# Patient Record
Sex: Female | Born: 1935 | Race: Black or African American | Hispanic: No | Marital: Single | Smoking: Never smoker
Health system: Southern US, Community
[De-identification: ages and names within clinical notes are randomized; demographics above are authoritative.]

## PROBLEM LIST (undated history)

## (undated) DIAGNOSIS — E559 Vitamin D deficiency, unspecified: Secondary | ICD-10-CM

## (undated) DIAGNOSIS — E785 Hyperlipidemia, unspecified: Secondary | ICD-10-CM

## (undated) DIAGNOSIS — M179 Osteoarthritis of knee, unspecified: Secondary | ICD-10-CM

## (undated) DIAGNOSIS — D631 Anemia in chronic kidney disease: Secondary | ICD-10-CM

## (undated) DIAGNOSIS — E213 Hyperparathyroidism, unspecified: Secondary | ICD-10-CM

## (undated) DIAGNOSIS — M48061 Spinal stenosis, lumbar region without neurogenic claudication: Secondary | ICD-10-CM

## (undated) DIAGNOSIS — I10 Essential (primary) hypertension: Secondary | ICD-10-CM

## (undated) DIAGNOSIS — N183 Chronic kidney disease, stage 3 (moderate): Secondary | ICD-10-CM

## (undated) DIAGNOSIS — E164 Increased secretion of gastrin: Secondary | ICD-10-CM

## (undated) DIAGNOSIS — I779 Disorder of arteries and arterioles, unspecified: Secondary | ICD-10-CM

## (undated) DIAGNOSIS — I739 Peripheral vascular disease, unspecified: Secondary | ICD-10-CM

## (undated) DIAGNOSIS — J45909 Unspecified asthma, uncomplicated: Secondary | ICD-10-CM

## (undated) DIAGNOSIS — C259 Malignant neoplasm of pancreas, unspecified: Secondary | ICD-10-CM

## (undated) DIAGNOSIS — K219 Gastro-esophageal reflux disease without esophagitis: Secondary | ICD-10-CM

## (undated) DIAGNOSIS — M1710 Unilateral primary osteoarthritis, unspecified knee: Secondary | ICD-10-CM

## (undated) DIAGNOSIS — K861 Other chronic pancreatitis: Secondary | ICD-10-CM

## (undated) DIAGNOSIS — N6091 Unspecified benign mammary dysplasia of right breast: Secondary | ICD-10-CM

## (undated) DIAGNOSIS — E119 Type 2 diabetes mellitus without complications: Secondary | ICD-10-CM

## (undated) HISTORY — DX: Gastro-esophageal reflux disease without esophagitis: K21.9

## (undated) HISTORY — PX: OVARIAN CYST REMOVAL: SHX89

## (undated) HISTORY — PX: WHIPPLE PROCEDURE: SHX2667

## (undated) HISTORY — DX: Chronic kidney disease, stage 3 (moderate): N18.3

## (undated) HISTORY — DX: Hyperlipidemia, unspecified: E78.5

## (undated) HISTORY — DX: Peripheral vascular disease, unspecified: I73.9

## (undated) HISTORY — DX: Disorder of arteries and arterioles, unspecified: I77.9

## (undated) HISTORY — DX: Increased secretion of gastrin: E16.4

## (undated) HISTORY — DX: Unilateral primary osteoarthritis, unspecified knee: M17.10

## (undated) HISTORY — DX: Osteoarthritis of knee, unspecified: M17.9

## (undated) HISTORY — PX: CATARACT EXTRACTION, BILATERAL: SHX1313

## (undated) HISTORY — DX: Essential (primary) hypertension: I10

## (undated) HISTORY — DX: Anemia in chronic kidney disease: D63.1

## (undated) HISTORY — DX: Vitamin D deficiency, unspecified: E55.9

## (undated) HISTORY — DX: Malignant neoplasm of pancreas, unspecified: C25.9

## (undated) HISTORY — PX: PARATHYROIDECTOMY: SHX19

## (undated) HISTORY — DX: Hyperparathyroidism, unspecified: E21.3

## (undated) HISTORY — DX: Other chronic pancreatitis: K86.1

## (undated) HISTORY — PX: TONSILLECTOMY: SUR1361

## (undated) HISTORY — DX: Unspecified asthma, uncomplicated: J45.909

## (undated) HISTORY — DX: Type 2 diabetes mellitus without complications: E11.9

---

## 2001-01-17 ENCOUNTER — Encounter: Payer: Self-pay | Admitting: Family Medicine

## 2001-01-17 ENCOUNTER — Ambulatory Visit (HOSPITAL_COMMUNITY): Admission: RE | Admit: 2001-01-17 | Discharge: 2001-01-17 | Payer: Self-pay | Admitting: Unknown Physician Specialty

## 2001-06-26 ENCOUNTER — Ambulatory Visit (HOSPITAL_COMMUNITY): Admission: RE | Admit: 2001-06-26 | Discharge: 2001-06-26 | Payer: Self-pay | Admitting: Unknown Physician Specialty

## 2001-06-26 ENCOUNTER — Encounter: Payer: Self-pay | Admitting: Occupational Therapy

## 2001-06-27 ENCOUNTER — Other Ambulatory Visit: Admission: RE | Admit: 2001-06-27 | Discharge: 2001-06-27 | Payer: Self-pay | Admitting: Specialist

## 2001-08-03 ENCOUNTER — Encounter: Payer: Self-pay | Admitting: Occupational Therapy

## 2001-08-03 ENCOUNTER — Ambulatory Visit (HOSPITAL_COMMUNITY): Admission: RE | Admit: 2001-08-03 | Discharge: 2001-08-03 | Payer: Self-pay | Admitting: Occupational Therapy

## 2002-08-20 ENCOUNTER — Encounter: Payer: Self-pay | Admitting: Occupational Therapy

## 2002-08-20 ENCOUNTER — Ambulatory Visit (HOSPITAL_COMMUNITY): Admission: RE | Admit: 2002-08-20 | Discharge: 2002-08-20 | Payer: Self-pay | Admitting: Occupational Therapy

## 2002-12-10 ENCOUNTER — Ambulatory Visit (HOSPITAL_COMMUNITY): Admission: RE | Admit: 2002-12-10 | Discharge: 2002-12-10 | Payer: Self-pay | Admitting: Occupational Therapy

## 2002-12-10 ENCOUNTER — Encounter: Payer: Self-pay | Admitting: Occupational Therapy

## 2003-08-28 ENCOUNTER — Ambulatory Visit (HOSPITAL_COMMUNITY): Admission: RE | Admit: 2003-08-28 | Discharge: 2003-08-28 | Payer: Self-pay | Admitting: Occupational Therapy

## 2004-09-01 ENCOUNTER — Ambulatory Visit (HOSPITAL_COMMUNITY): Admission: RE | Admit: 2004-09-01 | Discharge: 2004-09-01 | Payer: Self-pay | Admitting: Occupational Therapy

## 2005-05-22 ENCOUNTER — Emergency Department (HOSPITAL_COMMUNITY): Admission: EM | Admit: 2005-05-22 | Discharge: 2005-05-22 | Payer: Self-pay | Admitting: Emergency Medicine

## 2005-09-03 ENCOUNTER — Ambulatory Visit (HOSPITAL_COMMUNITY): Admission: RE | Admit: 2005-09-03 | Discharge: 2005-09-03 | Payer: Self-pay | Admitting: Occupational Therapy

## 2006-02-01 ENCOUNTER — Ambulatory Visit (HOSPITAL_COMMUNITY): Admission: RE | Admit: 2006-02-01 | Discharge: 2006-02-01 | Payer: Self-pay | Admitting: Urology

## 2006-07-26 ENCOUNTER — Observation Stay (HOSPITAL_COMMUNITY): Admission: EM | Admit: 2006-07-26 | Discharge: 2006-07-27 | Payer: Self-pay | Admitting: Emergency Medicine

## 2006-08-12 ENCOUNTER — Ambulatory Visit (HOSPITAL_COMMUNITY): Admission: RE | Admit: 2006-08-12 | Discharge: 2006-08-12 | Payer: Self-pay | Admitting: Nurse Practitioner

## 2006-08-26 ENCOUNTER — Ambulatory Visit (HOSPITAL_COMMUNITY): Admission: RE | Admit: 2006-08-26 | Discharge: 2006-08-26 | Payer: Self-pay | Admitting: Nurse Practitioner

## 2006-09-22 ENCOUNTER — Ambulatory Visit (HOSPITAL_COMMUNITY): Admission: RE | Admit: 2006-09-22 | Discharge: 2006-09-22 | Payer: Self-pay | Admitting: Nurse Practitioner

## 2007-04-15 ENCOUNTER — Emergency Department (HOSPITAL_COMMUNITY): Admission: EM | Admit: 2007-04-15 | Discharge: 2007-04-15 | Payer: Self-pay | Admitting: *Deleted

## 2007-09-25 ENCOUNTER — Ambulatory Visit (HOSPITAL_COMMUNITY): Admission: RE | Admit: 2007-09-25 | Discharge: 2007-09-25 | Payer: Self-pay | Admitting: Nurse Practitioner

## 2007-10-12 HISTORY — PX: COLONOSCOPY: SHX174

## 2008-05-17 ENCOUNTER — Emergency Department (HOSPITAL_COMMUNITY): Admission: EM | Admit: 2008-05-17 | Discharge: 2008-05-17 | Payer: Self-pay | Admitting: Emergency Medicine

## 2008-06-04 ENCOUNTER — Ambulatory Visit (HOSPITAL_COMMUNITY): Admission: RE | Admit: 2008-06-04 | Discharge: 2008-06-04 | Payer: Self-pay | Admitting: Nurse Practitioner

## 2008-07-05 ENCOUNTER — Ambulatory Visit (HOSPITAL_COMMUNITY): Admission: RE | Admit: 2008-07-05 | Discharge: 2008-07-05 | Payer: Self-pay | Admitting: Nurse Practitioner

## 2008-10-01 ENCOUNTER — Ambulatory Visit (HOSPITAL_COMMUNITY): Admission: RE | Admit: 2008-10-01 | Discharge: 2008-10-01 | Payer: Self-pay | Admitting: Nurse Practitioner

## 2008-10-03 ENCOUNTER — Ambulatory Visit (HOSPITAL_COMMUNITY): Admission: RE | Admit: 2008-10-03 | Discharge: 2008-10-03 | Payer: Self-pay | Admitting: Nurse Practitioner

## 2008-12-14 ENCOUNTER — Emergency Department (HOSPITAL_COMMUNITY): Admission: EM | Admit: 2008-12-14 | Discharge: 2008-12-14 | Payer: Self-pay | Admitting: Emergency Medicine

## 2009-10-14 ENCOUNTER — Ambulatory Visit (HOSPITAL_COMMUNITY): Admission: RE | Admit: 2009-10-14 | Discharge: 2009-10-14 | Payer: Self-pay | Admitting: Specialist

## 2009-11-03 ENCOUNTER — Emergency Department (HOSPITAL_COMMUNITY): Admission: EM | Admit: 2009-11-03 | Discharge: 2009-11-03 | Payer: Self-pay | Admitting: Emergency Medicine

## 2010-04-07 ENCOUNTER — Ambulatory Visit (HOSPITAL_COMMUNITY): Admission: RE | Admit: 2010-04-07 | Discharge: 2010-04-07 | Payer: Self-pay | Admitting: Nurse Practitioner

## 2010-09-11 ENCOUNTER — Ambulatory Visit (HOSPITAL_COMMUNITY)
Admission: RE | Admit: 2010-09-11 | Discharge: 2010-09-11 | Payer: Self-pay | Source: Home / Self Care | Admitting: Occupational Therapy

## 2010-11-01 ENCOUNTER — Encounter: Payer: Self-pay | Admitting: Urology

## 2010-11-05 ENCOUNTER — Ambulatory Visit (HOSPITAL_COMMUNITY)
Admission: RE | Admit: 2010-11-05 | Discharge: 2010-11-05 | Payer: Self-pay | Source: Home / Self Care | Attending: Nurse Practitioner | Admitting: Nurse Practitioner

## 2011-01-08 ENCOUNTER — Other Ambulatory Visit: Payer: Self-pay | Admitting: Obstetrics & Gynecology

## 2011-01-08 ENCOUNTER — Other Ambulatory Visit (HOSPITAL_COMMUNITY)
Admission: RE | Admit: 2011-01-08 | Discharge: 2011-01-08 | Disposition: A | Discharge status: Home / Self Care | Payer: Medicare Other | Source: Ambulatory Visit | Attending: Obstetrics and Gynecology | Admitting: Obstetrics and Gynecology

## 2011-01-08 DIAGNOSIS — Z124 Encounter for screening for malignant neoplasm of cervix: Secondary | ICD-10-CM | POA: Insufficient documentation

## 2011-02-22 ENCOUNTER — Ambulatory Visit: Payer: Self-pay | Admitting: Orthopedic Surgery

## 2011-02-26 NOTE — Procedures (Signed)
   NAME:  Veronica Stevenson, Veronica Stevenson                         ACCOUNT NO.:  [id number]   MEDICAL RECORD NO.:  [id number]                   PATIENT TYPE:  OUT   LOCATION:  RAD                                  FACILITY:  [facility]   PHYSICIAN:  Vida Roller, M.D.                DATE OF BIRTH:  Apr 10, 1933   DATE OF PROCEDURE:  12/10/2002  DATE OF DISCHARGE:                                  ECHOCARDIOGRAM   REFERRING PHYSICIAN:  Dr. Randell Patient   PROCEDURE:  Echocardiogram.  Tape #[id number], tape count 1498 to 1877.   REASON FOR CONSULTATION:  Syncope.  Quality of the echocardiogram is  adequate.   M-MODE MEASUREMENTS:  1. The aorta is 29 mm.  2. The left atrium is 33 mm.  3. The septum is 14 mm, which is enlarged.  4. The posterior wall is 10 mm.  5. The left ventricular diastolic dimension is 35 mm.  6. The left ventricular systolic dimension is 25 mm.   2-D AND DOPPLER IMAGING:  1. The left ventricle is normal size with mild concentric left ventricular     hypertrophy.  There is evidence of inferior wall and inferoposterior     hypokinesis with a mild thinning of the posterior wall of the left     ventricle.  The diastolic function is mildly impaired but not fully     assessed.  2. The right ventricle is normal size with normal systolic function.  3. Both atria are normal size.  The subcostal views are inadequate to assess     for atrioseptal defect.  4. The aortic valve is trileaflet/tricommissural with mild sclerosis.  No     stenosis or regurgitation is seen.  5. The mitral valve is morphologically unremarkable with no stenosis or     regurgitation.  6. The tricuspid valve is morphologically unremarkable with mild tricuspid     regurgitation.  No stenosis is seen.  7. The pulmonic valve was not well seen.  8. The pericardial structures were not well seen.  9. The ascending aorta was not well seen.                                               Vida Roller, M.D.    JH/MEDQ  D:   12/10/2002  T:  12/10/2002  Job:  [id number]

## 2011-02-26 NOTE — H&P (Signed)
Veronica Stevenson, Veronica Stevenson               ACCOUNT NO.:  [id number]   MEDICAL RECORD NO.:  [id number]          PATIENT TYPE:  OBV   LOCATION:  A218                          FACILITY:  [facility]   PHYSICIAN:  Osvaldo Shipper, MD     DATE OF BIRTH:  1933-06-18   DATE OF ADMISSION:  07/26/2006  DATE OF DISCHARGE:  LH                                HISTORY & PHYSICAL   PRIMARY CARE PHYSICIAN:  Dr. Ninfa Linden  at [place].   CARDIOLOGIST:  Dr. Jerrol Banana?  at [place], [place].   ADMITTING DIAGNOSES:  1. Chest pain, rule out acute coronary syndrome, rule out pulmonary      embolism.  2. History of hypertension.  3. History of dyslipidemia.  4. History of mild nonobstructive coronary artery disease.  5. History of asthma.   CHIEF COMPLAINT:  Chest pain since this morning.   HISTORY OF PRESENT ILLNESS:  The patient is a 77 year old, African-American  female who has history of hypertension and dyslipidemia who woke up this  morning and ate breakfast about 745.  At about 8:30, she experienced  retrosternal chest pressure that she also describes sometimes as a sharp  pain.  She was at rest when the pain began.  It was 10/10 in intensity.  She  walked in the hallway of her home which seemed to relieve the pain.  It  became a 4-6 out of 10.  She then subsequently took 3 Rolaids which the pain  went away.  Subsequently, she went to her car and started driving to [employer], halfway in between near [place] she had a  recurrence of her  sharp pain 10/10 in intensity, and she came into the ED.  By the time the  patient was given any medications in the ER, the patient's pain had gone  away.  Currently, she is completely pain free.  She did not have any  shortness of breath, palpitation, nausea, vomiting, cough, fever, or chills  with this episode.  No history of similar chest pain in the past.   The patient reports a stress test one year ago which was done by Dr. Jerrol Banana?  at [facility] and which  apparently was abnormal which prompted a cardiac  catheterization also done 1 year ago which apparently showed 20% blockages.  The patient is quite emphatic that she did not have any kind of intervention  done during that procedure.  The patient also reports a carotid Doppler  study done just a few days ago at [facility] which apparently did not show any  significant disease.   MEDICATIONS AT HOME:  1. Toprol XL 25 mg daily.  2. Enteric-coated aspirin 81 mg daily.  3. Zocor 10 mg daily.  4. Azmacort p.r.n.   ALLERGIES:  PENICILLIN causes syncope.  CODEINE causes a rash.   PAST MEDICAL HISTORY:  1. Hypertension.  2. Asthma versus bronchitis.  3. Dyslipidemia.  4. No surgeries in the past.   FAMILY HISTORY:  Her mother had CVA and MI at the age of 49.  Father had  stroke.   SOCIAL HISTORY:  L[place] in  [place].  Works.  She is currently retired.  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  No  smoking use.  No alcohol use.  No illicit drug use.  She is independent with  her ADLs.   REVIEW OF SYSTEMS:  GENERAL:  Unremarkable.  CARDIOVASCULAR:  See HPI.  RESPIRATORY:  See HPI.  GI:  Unremarkable.  GU:  Unremarkable.  ENDOCRINE:  Unremarkable.  NEUROLOGICAL:  Unremarkable.   PHYSICAL EXAMINATION:  VITAL SIGNS:  The patient's temperature 97.3, heart  rate in the 70s, respiratory rate 16, blood pressure 156/90, saturations not  recorded on the floor.  GENERAL:  This is a well-developed, well-nourished individual in no apparent  distress.  HEENT:  There is no pallor.  No icterus.  Oral mucous membrane is moist.  No  oral lesions are noted.  NECK:  Soft.  No thyromegaly appreciated.  LUNGS:  Clear to auscultation bilaterally.  CARDIOVASCULAR:  S1, S2 is normal regular.  No murmurs appreciated.  No S3,  S4.  No JVD is heard.  No murmurs heard.  There is a left-sided carotid  bruit that is appreciated.  ABDOMEN:  Soft, nontender, and nondistended.  Bowel  sounds are present.  No  mass or organomegaly appreciated.  EXTREMITIES:  Without edema.  Peripheral pulses are palpable.  No calf  tenderness is present.  NEUROLOGICALLY:  The patient is alert and oriented x3.  No focal  neurological deficits appreciated.   LAB DATA:  CBC is unremarkable.  BMET revealed a potassium of 3.4.  LFTs are  not available.  Two sets of cardiac markers are negative.   Chest x-ray was unremarkable with no acute cardiopulmonary process.   EKG shows normal sinus rhythm with a normal axis.  Two sets of EKG available  about 4 hours apart.  The first set possibly shows mild ST depression in 4,  5, 6, but they are not consistent findings.  The second set of EKG shows no  ST depressions but again not too much of a difference between the 2 EKGs.   IMPRESSION:  This is a 77 year old, African-American female with history of  hypertension, asthma, and dyslipidemia who presents with chest pain.  The  pain is somewhat atypical for cardiac etiology.  This especially considering  recent unremarkable cardiac cath.  Hence, coronary artery disease is less  likely.  Other differentials include pulmonary embolism or acid reflux  disease.  The acid reflux disease is more likely diagnosed in this  individual.   PLAN:  Chest pain.  We will admit to telemetry and actually observe her in  the hospital.  We will get a D-dimer to screen for PE.  Do serial cardiac  enzymes and check a lipid profile in the morning.  Start her on PPIs.  If  enzymes are negative and if her D-dimer is unremarkable, we will let her go  home tomorrow and ask her to followup with her cardiologist.  If D-dimer is  abnormal, we will obviously obtain a CT scan of the chest.  The patient  reports recent carotid Doppler for her carotid bruit.  Hence, I will not  repeat this test at this time since she does not have any neurological deficits.  Echo may be considered on this individual if there is any reason  by  tomorrow morning to get one, but I do have a report of an echo from 2004  which reveals LVH with some hypokinesis in the inferior wall and  posterior  wall.  The systolic function was not commented upon.  There was some  diastolic dysfunction appreciated.   She is hypertensive.  We will restart her Toprol which she did not take  today.  If needed, further agents will be added.  Continue Zocor.  Continue  aspirin.   DVT/GI prophylaxis initiated.      Osvaldo Shipper, MD  Electronically Signed     GK/MEDQ  D:  07/26/2006  T:  07/26/2006  Job:  [id number]

## 2011-02-26 NOTE — Discharge Summary (Signed)
Veronica Stevenson, Veronica Stevenson               ACCOUNT NO.:  [id number]   MEDICAL RECORD NO.:  [id number]          PATIENT TYPE:  OBV   LOCATION:  A218                          FACILITY:  [facility]   PHYSICIAN:  Osvaldo Shipper, MD     DATE OF BIRTH:  Jul 23, 1933   DATE OF ADMISSION:  07/26/2006  DATE OF DISCHARGE:  10/17/2007LH                                 DISCHARGE SUMMARY   CARDIOLOGIST:  Dr. Graciela Husbands? at [place].   PRIMARY MEDICAL DOCTOR:  At [place].   DISCHARGE DIAGNOSES:  1. Chest pain possibly related to gastroesophageal reflux disease or      musculoskeletal.  2. Incidental finding of right lower lobe infiltrate.  3. History of nonobstructive coronary artery disease with catheterization      1 year ago.  4. Left carotid bruit with a recent carotid ultrasound, results unknown.  5. History of hypertension, better controlled.  6. History of dyslipidemia.   BRIEF HOSPITAL COURSE:  Briefly, this is a 77 year old African American  female who presented with retrosternal chest pain, whose onset was 1 day  prior to admission.  The patient's symptoms were atypical for coronary  artery disease.  She had negative EKG and negative cardiac markers; however,  she was admitted because she had history of hypertension and dyslipidemia.  The patient was observed in the hospital overnight.  Cardiac enzymes were  cycled, which were all negative.  Blood work today is unremarkable.  D-dimer  was mildly positive; hence, because of the sharp character of her pain, she  underwent a CT of her chest, which was negative for PE as well.  CT,  however, did show an incidental right lower lobe atelectasis versus  pneumonia.  The patient denied any symptoms of cough or fever.   Her blood pressure was also not optimally controlled, hence we increased her  Toprol to 50 mg daily.  Her blood pressure is better controlled this  morning.   She does have a left carotid bruit and I was told by the patient that she  did  have an ultrasound of her carotids just a week ago and she does not know  the results of this.  She said she will talk to Dr. Graciela Husbands?, her  cardiologist, for the results when she goes home.   This morning, the patient is feeling quite well.  She has not had any chest  pain since yesterday.  She does not have any neurological deficits.  No  other symptoms whatsoever.  She is keen on going home; considering this, we  will discharge her.   DISCHARGE MEDICATIONS:  1. Levaquin 750 mg p.o. daily for 5 days; one dose will be given in the      hospital.  2. Prilosec 20 mg daily.  3. Toprol-XL 50 mg daily.  4. Otherwise, she may resume her other outpatient medications as before.   DIET:  Heart-healthy diet.   PHYSICAL ACTIVITY:  No restrictions.   FOLLOWUP:  She has been asked to call her cardiologist as soon as possible  to schedule an appointment.   Please also review my H&P  for further details regarding this individual.   TOTAL TIME OF DISCHARGE:  Twenty minutes.   COMMENT:  Please note above is preliminary until signed.      Osvaldo Shipper, MD  Electronically Signed     GK/MEDQ  D:  07/27/2006  T:  07/27/2006  Job:  [id number]   cc:   [address], [address], [place], [place] [address] Autumn Patty M.D.   [place], [place] Jay Schlichter MD

## 2011-07-09 LAB — URINALYSIS, ROUTINE W REFLEX MICROSCOPIC
Glucose, UA: NEGATIVE
Specific Gravity, Urine: >1.03 — ABNORMAL HIGH
pH: 5.5

## 2011-07-09 LAB — URINE MICROSCOPIC-ADD ON

## 2011-11-12 ENCOUNTER — Other Ambulatory Visit (HOSPITAL_COMMUNITY): Payer: Self-pay | Admitting: Nurse Practitioner

## 2011-11-12 DIAGNOSIS — Z139 Encounter for screening, unspecified: Secondary | ICD-10-CM

## 2011-11-18 ENCOUNTER — Ambulatory Visit (HOSPITAL_COMMUNITY)
Admission: RE | Admit: 2011-11-18 | Discharge: 2011-11-18 | Disposition: A | Discharge status: Home / Self Care | Payer: Medicare Other | Source: Ambulatory Visit | Attending: Nurse Practitioner | Admitting: Nurse Practitioner

## 2011-11-18 DIAGNOSIS — Z1231 Encounter for screening mammogram for malignant neoplasm of breast: Secondary | ICD-10-CM | POA: Insufficient documentation

## 2011-11-18 DIAGNOSIS — Z139 Encounter for screening, unspecified: Secondary | ICD-10-CM

## 2012-03-26 ENCOUNTER — Encounter (HOSPITAL_COMMUNITY): Payer: Self-pay | Admitting: Emergency Medicine

## 2012-03-26 ENCOUNTER — Emergency Department (HOSPITAL_COMMUNITY): Payer: Medicare Other

## 2012-03-26 ENCOUNTER — Emergency Department (HOSPITAL_COMMUNITY)
Admission: EM | Admit: 2012-03-26 | Discharge: 2012-03-26 | Disposition: A | Discharge status: Home / Self Care | Payer: Medicare Other | Attending: Emergency Medicine | Admitting: Emergency Medicine

## 2012-03-26 DIAGNOSIS — R059 Cough, unspecified: Secondary | ICD-10-CM | POA: Insufficient documentation

## 2012-03-26 DIAGNOSIS — Z79899 Other long term (current) drug therapy: Secondary | ICD-10-CM | POA: Insufficient documentation

## 2012-03-26 DIAGNOSIS — I10 Essential (primary) hypertension: Secondary | ICD-10-CM | POA: Insufficient documentation

## 2012-03-26 DIAGNOSIS — R05 Cough: Secondary | ICD-10-CM

## 2012-03-26 DIAGNOSIS — E7800 Pure hypercholesterolemia, unspecified: Secondary | ICD-10-CM | POA: Insufficient documentation

## 2012-03-26 DIAGNOSIS — Z7982 Long term (current) use of aspirin: Secondary | ICD-10-CM | POA: Insufficient documentation

## 2012-03-26 HISTORY — DX: Essential (primary) hypertension: I10

## 2012-03-26 NOTE — ED Notes (Signed)
Pt states she does not want to wait any longer.  States since she doesn't have pneumonia she is going to go home, and take OTC cough medicine.  Explained to pt leaving AMA, and pt verbalized understanding of ramifications.  AMA form signed.

## 2012-03-26 NOTE — ED Notes (Signed)
Patient c/o cough x3 days. Per patient productive with white thin sputum. Patient states "I was treated 2 months ago for bronchitis by my doctor with antibiotics and I got better. So I don't know if this is another episode." Patient reports shortness of breath after coughing-noted with excertion.

## 2012-03-26 NOTE — ED Notes (Signed)
Pt reporting cough x3 nights.  Reports SOB "only when I cough a lot".  Reports thin white sputum.  Denies fever, nausea or vomiting.  No additional complaints.  No distress noted.

## 2012-03-26 NOTE — ED Provider Notes (Signed)
History   This chart was scribed for Veronica Stevenson B. Bernette Mayers, MD scribed by Magnus Sinning. The patient was seen in room APA10/APA10 seen at 17:30.   CSN: [id number]  Arrival date & time 03/26/12  1656   First MD Initiated Contact with Patient 03/26/12 1730      Chief Complaint  Patient presents with  . Cough    (Consider location/radiation/quality/duration/timing/severity/associated sxs/prior treatment) HPI Veronica Stevenson is a 78 y.o. female who presents to the Emergency Department complaining of constant moderate productive cough, onset 3 days with associated SOB,(worse at night), and sore throat. Has had some chest soreness. Pt states she has hx of bronchitis, which was  treated with abx 2 months ago. She says it was relieved at abx completion, but explains that it feels like it has returned. States she took robitussin with no relief. Denies fever, runny nose or hx of tobacco use. Past Medical History  Diagnosis Date  . Hypertension   . High cholesterol   . Bronchitis     Past Surgical History  Procedure Date  . Cardiac catheterization     History reviewed. No pertinent family history.  History  Substance Use Topics  . Smoking status: Never Smoker   . Smokeless tobacco: Never Used  . Alcohol Use: No   Review of Systems 10 Systems reviewed and are negative for acute change except as noted in the HPI. Allergies  Codeine and Penicillins  Home Medications   Current Outpatient Rx  Name Route Sig Dispense Refill  . ASPIRIN EC 81 MG PO TBEC Oral Take 81 mg by mouth every morning.    Marland Kitchen VITAMIN D 2000 UNITS PO CAPS Oral Take 1 capsule by mouth daily.    Marland Kitchen LOSARTAN POTASSIUM-HCTZ 50-12.5 MG PO TABS Oral Take 1 tablet by mouth every morning.    Marland Kitchen METOPROLOL SUCCINATE ER 25 MG PO TB24 Oral Take 25 mg by mouth every morning.    Marland Kitchen NAPROXEN SODIUM 220 MG PO CAPS Oral Take 220 mg by mouth as needed. For pain    . SIMVASTATIN 40 MG PO TABS Oral Take 40 mg by mouth every evening.        BP 141/66  Pulse 72  Temp 98.2 F (36.8 C) (Oral)  Resp 20  Ht 5\' 5"  (1.651 m)  Wt 144 lb (65.318 kg)  BMI 23.96 kg/m2  SpO2 98%  Physical Exam  Nursing note and vitals reviewed. Constitutional: She is oriented to person, place, and time. She appears well-developed and well-nourished.  HENT:  Head: Normocephalic and atraumatic.  Eyes: EOM are normal. Pupils are equal, round, and reactive to light.  Neck: Normal range of motion. Neck supple.       Throat nml.  Cardiovascular: Normal rate, normal heart sounds and intact distal pulses.   Pulmonary/Chest: Effort normal and breath sounds normal. She has no wheezes. She has no rales.       Lungs clear. Dry cough.  Abdominal: Bowel sounds are normal. She exhibits no distension. There is no tenderness.  Musculoskeletal: Normal range of motion. She exhibits no edema and no tenderness.  Neurological: She is alert and oriented to person, place, and time. She has normal strength. No cranial nerve deficit or sensory deficit.  Skin: Skin is warm and dry. No rash noted.  Psychiatric: She has a normal mood and affect.    ED Course  Procedures (including critical care time) DIAGNOSTIC STUDIES: Oxygen Saturation is 98% on room air, normal by my interpretation.  COORDINATION OF CARE:   Dg Chest 2 View  03/26/2012  *RADIOLOGY REPORT*  Clinical Data: Cough.  Bronchitis peri  CHEST - 2 VIEW  Comparison: 09/11/2010  Findings: Heart size is normal.  Both lungs are clear.  No evidence of pleural effusion.  No mass or lymphadenopathy identified.  No significant change is seen compared to prior exam.  IMPRESSION: Stable exam.  No active disease.  Original Report Authenticated By: Danae Orleans, M.D.     No diagnosis found.    MDM   Date: 03/26/2012  Rate: 74  Rhythm: normal sinus rhythm  QRS Axis: normal  Intervals: normal  ST/T Wave abnormalities: normal  Conduction Disutrbances:none  Narrative Interpretation:   Old EKG Reviewed:  none available   I personally performed the services described in the documentation, which were scribed in my presence. The recorded information has been reviewed and considered.     CXR neg. Pt offered antitussives and agreed initially but left before Rx or paperwork could be completed.      Josslynn Mentzer B. Bernette Mayers, MD 03/26/12 [phone]

## 2012-07-11 DEATH — deceased

## 2012-11-15 ENCOUNTER — Other Ambulatory Visit (HOSPITAL_COMMUNITY): Payer: Self-pay | Admitting: Nurse Practitioner

## 2012-11-15 DIAGNOSIS — Z139 Encounter for screening, unspecified: Secondary | ICD-10-CM

## 2012-11-28 ENCOUNTER — Ambulatory Visit (HOSPITAL_COMMUNITY)
Admission: RE | Admit: 2012-11-28 | Discharge: 2012-11-28 | Disposition: A | Discharge status: Home / Self Care | Payer: Medicare Other | Source: Ambulatory Visit | Attending: Nurse Practitioner | Admitting: Nurse Practitioner

## 2012-11-28 DIAGNOSIS — Z139 Encounter for screening, unspecified: Secondary | ICD-10-CM

## 2012-11-28 DIAGNOSIS — Z1231 Encounter for screening mammogram for malignant neoplasm of breast: Secondary | ICD-10-CM | POA: Insufficient documentation

## 2012-12-01 ENCOUNTER — Other Ambulatory Visit: Payer: Self-pay | Admitting: Nurse Practitioner

## 2012-12-13 ENCOUNTER — Ambulatory Visit (HOSPITAL_COMMUNITY)
Admission: RE | Admit: 2012-12-13 | Discharge: 2012-12-13 | Disposition: A | Discharge status: Home / Self Care | Payer: Medicare Other | Source: Ambulatory Visit | Attending: Nurse Practitioner | Admitting: Nurse Practitioner

## 2012-12-13 DIAGNOSIS — R928 Other abnormal and inconclusive findings on diagnostic imaging of breast: Secondary | ICD-10-CM | POA: Insufficient documentation

## 2013-01-08 ENCOUNTER — Other Ambulatory Visit (HOSPITAL_COMMUNITY): Payer: Self-pay | Admitting: Nurse Practitioner

## 2013-01-08 DIAGNOSIS — IMO0001 Reserved for inherently not codable concepts without codable children: Secondary | ICD-10-CM

## 2013-02-19 ENCOUNTER — Ambulatory Visit (HOSPITAL_COMMUNITY)
Admission: RE | Admit: 2013-02-19 | Discharge: 2013-02-19 | Disposition: A | Discharge status: Home / Self Care | Payer: Medicare Other | Source: Ambulatory Visit | Attending: Nurse Practitioner | Admitting: Nurse Practitioner

## 2013-02-19 ENCOUNTER — Other Ambulatory Visit (HOSPITAL_COMMUNITY): Payer: Self-pay | Admitting: Nurse Practitioner

## 2013-02-19 DIAGNOSIS — IMO0001 Reserved for inherently not codable concepts without codable children: Secondary | ICD-10-CM

## 2013-02-19 DIAGNOSIS — R0989 Other specified symptoms and signs involving the circulatory and respiratory systems: Secondary | ICD-10-CM | POA: Insufficient documentation

## 2013-02-19 DIAGNOSIS — I658 Occlusion and stenosis of other precerebral arteries: Secondary | ICD-10-CM | POA: Insufficient documentation

## 2013-02-19 DIAGNOSIS — I10 Essential (primary) hypertension: Secondary | ICD-10-CM | POA: Insufficient documentation

## 2013-02-19 DIAGNOSIS — I6529 Occlusion and stenosis of unspecified carotid artery: Secondary | ICD-10-CM | POA: Insufficient documentation

## 2013-07-24 ENCOUNTER — Other Ambulatory Visit: Payer: Self-pay | Admitting: Obstetrics & Gynecology

## 2013-07-26 ENCOUNTER — Other Ambulatory Visit (HOSPITAL_COMMUNITY)
Admission: RE | Admit: 2013-07-26 | Discharge: 2013-07-26 | Disposition: A | Discharge status: Home / Self Care | Payer: Medicare Other | Source: Ambulatory Visit | Attending: Obstetrics & Gynecology | Admitting: Obstetrics & Gynecology

## 2013-07-26 ENCOUNTER — Ambulatory Visit (INDEPENDENT_AMBULATORY_CARE_PROVIDER_SITE_OTHER): Payer: Medicare Other | Admitting: Obstetrics & Gynecology

## 2013-07-26 ENCOUNTER — Encounter (INDEPENDENT_AMBULATORY_CARE_PROVIDER_SITE_OTHER): Payer: Self-pay

## 2013-07-26 ENCOUNTER — Encounter: Payer: Self-pay | Admitting: Obstetrics & Gynecology

## 2013-07-26 VITALS — BP 120/80 | Ht 63.0 in | Wt 142.0 lb

## 2013-07-26 DIAGNOSIS — Z1212 Encounter for screening for malignant neoplasm of rectum: Secondary | ICD-10-CM

## 2013-07-26 DIAGNOSIS — Z124 Encounter for screening for malignant neoplasm of cervix: Secondary | ICD-10-CM | POA: Insufficient documentation

## 2013-07-26 DIAGNOSIS — I10 Essential (primary) hypertension: Secondary | ICD-10-CM

## 2013-07-26 DIAGNOSIS — Z01419 Encounter for gynecological examination (general) (routine) without abnormal findings: Secondary | ICD-10-CM

## 2013-07-26 DIAGNOSIS — E7800 Pure hypercholesterolemia, unspecified: Secondary | ICD-10-CM

## 2013-07-26 NOTE — Progress Notes (Signed)
Patient ID: Veronica Stevenson, female   DOB: Jul 03, 1933, 79 y.o.   MRN: [id number] Subjective:     Veronica Stevenson is a 79 y.o. female here for a routine exam.  No LMP recorded. Patient is postmenopausal. G0P0 Current complaints: none.  Gynecologic History No LMP recorded. Patient is postmenopausal. Contraception: post menopausal status Last Pap: 2012. Results were: normal Last mammogram: 2014. Results were: normal  Past Medical History  Diagnosis Date  . Hypertension   . High cholesterol   . Bronchitis     Past Surgical History  Procedure Laterality Date  . Cardiac catheterization      OB History   Grav Para Term Preterm Abortions TAB SAB Ect Mult Living            0      History   Social History  . Marital Status: Single    Spouse Name: N/A    Number of Children: N/A  . Years of Education: N/A   Social History Main Topics  . Smoking status: Never Smoker   . Smokeless tobacco: Never Used  . Alcohol Use: No  . Drug Use: No  . Sexual Activity: Not Currently   Other Topics Concern  . None   Social History Narrative  . None    Family History  Problem Relation Age of Onset  . Stroke Mother   . Stroke Father      Review of Systems  Review of Systems  Constitutional: Negative for fever, chills, weight loss, malaise/fatigue and diaphoresis.  HENT: Negative for hearing loss, ear pain, nosebleeds, congestion, sore throat, neck pain, tinnitus and ear discharge.   Eyes: Negative for blurred vision, double vision, photophobia, pain, discharge and redness.  Respiratory: Negative for cough, hemoptysis, sputum production, shortness of breath, wheezing and stridor.   Cardiovascular: Negative for chest pain, palpitations, orthopnea, claudication, leg swelling and PND.  Gastrointestinal: negative for abdominal pain. Negative for heartburn, nausea, vomiting, diarrhea, constipation, blood in stool and melena.  Genitourinary: Negative for dysuria, urgency, frequency,  hematuria and flank pain.  Musculoskeletal: Negative for myalgias, back pain, joint pain and falls.  Skin: Negative for itching and rash.  Neurological: Negative for dizziness, tingling, tremors, sensory change, speech change, focal weakness, seizures, loss of consciousness, weakness and headaches.  Endo/Heme/Allergies: Negative for environmental allergies and polydipsia. Does not bruise/bleed easily.  Psychiatric/Behavioral: Negative for depression, suicidal ideas, hallucinations, memory loss and substance abuse. The patient is not nervous/anxious and does not have insomnia.        Objective:    Physical Exam  Vitals reviewed. Constitutional: She is oriented to person, place, and time. She appears well-developed and well-nourished.  HENT:  Head: Normocephalic and atraumatic.        Right Ear: External ear normal.  Left Ear: External ear normal.  Nose: Nose normal.  Mouth/Throat: Oropharynx is clear and moist.  Eyes: Conjunctivae and EOM are normal. Pupils are equal, round, and reactive to light. Right eye exhibits no discharge. Left eye exhibits no discharge. No scleral icterus.  Neck: Normal range of motion. Neck supple. No tracheal deviation present. No thyromegaly present.  Cardiovascular: Normal rate, regular rhythm, normal heart sounds and intact distal pulses.  Exam reveals no gallop and no friction rub.   No murmur heard. Respiratory: Effort normal and breath sounds normal. No respiratory distress. She has no wheezes. She has no rales. She exhibits no tenderness.  GI: Soft. Bowel sounds are normal. She exhibits no distension and no mass. There is  no tenderness. There is no rebound and no guarding.  Genitourinary:  Breasts no masses skin changes or nipple changes bilaterally      Vulva is normal without lesions Vagina is pink moist without discharge Cervix normal in appearance and pap is done Uterus is normal size shape and contour Adnexa is negative with normal sized ovaries   Rectal    hemoccult negative, normal tone, no masses  Musculoskeletal: Normal range of motion. She exhibits no edema and no tenderness.  Neurological: She is alert and oriented to person, place, and time. She has normal reflexes. She displays normal reflexes. No cranial nerve deficit. She exhibits normal muscle tone. Coordination normal.  Skin: Skin is warm and dry. No rash noted. No erythema. No pallor.  Psychiatric: She has a normal mood and affect. Her behavior is normal. Judgment and thought content normal.       Assessment:    Healthy female exam.    Plan:    Follow up in: 1 year.

## 2013-07-26 NOTE — Addendum Note (Signed)
Addended by: Malachy Mood S on: 07/26/2013 12:40 PM   Modules accepted: Orders

## 2013-12-13 ENCOUNTER — Other Ambulatory Visit (HOSPITAL_COMMUNITY): Payer: Self-pay | Admitting: Nurse Practitioner

## 2013-12-13 DIAGNOSIS — Z1231 Encounter for screening mammogram for malignant neoplasm of breast: Secondary | ICD-10-CM

## 2013-12-18 ENCOUNTER — Ambulatory Visit (HOSPITAL_COMMUNITY)
Admission: RE | Admit: 2013-12-18 | Discharge: 2013-12-18 | Disposition: A | Discharge status: Home / Self Care | Payer: Medicare Other | Source: Ambulatory Visit | Attending: Nurse Practitioner | Admitting: Nurse Practitioner

## 2013-12-18 DIAGNOSIS — Z1231 Encounter for screening mammogram for malignant neoplasm of breast: Secondary | ICD-10-CM | POA: Insufficient documentation

## 2014-10-31 ENCOUNTER — Emergency Department: Payer: Self-pay | Admitting: Emergency Medicine

## 2014-10-31 LAB — URINALYSIS, COMPLETE
BILIRUBIN, UR: NEGATIVE
Bacteria: NONE SEEN
GLUCOSE, UR: NEGATIVE mg/dL (ref 0–75)
Hyaline Cast: 2
Ketone: NEGATIVE
LEUKOCYTE ESTERASE: NEGATIVE
Nitrite: NEGATIVE
PH: 5 (ref 4.5–8.0)
Protein: NEGATIVE
Specific Gravity: 1.024 (ref 1.003–1.030)
WBC UR: 1 /HPF (ref 0–5)

## 2014-10-31 LAB — CBC WITH DIFFERENTIAL/PLATELET
BASOS ABS: 0 10*3/uL (ref 0.0–0.1)
Basophil %: 0.6 %
EOS PCT: 1.6 %
Eosinophil #: 0.1 10*3/uL (ref 0.0–0.7)
HCT: 38.4 % (ref 35.0–47.0)
HGB: 12.2 g/dL (ref 12.0–16.0)
LYMPHS PCT: 29.9 %
Lymphocyte #: 2.4 10*3/uL (ref 1.0–3.6)
MCH: 29.2 pg (ref 26.0–34.0)
MCHC: 31.8 g/dL — ABNORMAL LOW (ref 32.0–36.0)
MCV: 92 fL (ref 80–100)
Monocyte #: 0.6 x10 3/mm (ref 0.2–0.9)
Monocyte %: 8.2 %
NEUTROS PCT: 59.7 %
Neutrophil #: 4.7 10*3/uL (ref 1.4–6.5)
PLATELETS: 218 10*3/uL (ref 150–440)
RBC: 4.17 10*6/uL (ref 3.80–5.20)
RDW: 13.8 % (ref 11.5–14.5)
WBC: 7.9 10*3/uL (ref 3.6–11.0)

## 2014-10-31 LAB — COMPREHENSIVE METABOLIC PANEL
ALBUMIN: 3.6 g/dL (ref 3.4–5.0)
ALK PHOS: 88 U/L
ANION GAP: 8 (ref 7–16)
AST: 35 U/L (ref 15–37)
BILIRUBIN TOTAL: 0.4 mg/dL (ref 0.2–1.0)
BUN: 19 mg/dL — AB (ref 7–18)
CALCIUM: 9.3 mg/dL (ref 8.5–10.1)
CHLORIDE: 108 mmol/L — AB (ref 98–107)
Co2: 26 mmol/L (ref 21–32)
Creatinine: 1.1 mg/dL (ref 0.60–1.30)
EGFR (African American): >60
GFR CALC NON AF AMER: 51 — AB
Glucose: 89 mg/dL (ref 65–99)
OSMOLALITY: 285 (ref 275–301)
Potassium: 3.5 mmol/L (ref 3.5–5.1)
SGPT (ALT): 26 U/L
Sodium: 142 mmol/L (ref 136–145)
Total Protein: 7.6 g/dL (ref 6.4–8.2)

## 2014-10-31 LAB — TROPONIN I: Troponin-I: <0.02 ng/mL

## 2014-10-31 LAB — LIPASE, BLOOD: Lipase: 1265 U/L — ABNORMAL HIGH (ref 73–393)

## 2015-01-08 ENCOUNTER — Inpatient Hospital Stay (HOSPITAL_COMMUNITY)
Admission: EM | Admit: 2015-01-08 | Discharge: 2015-01-10 | DRG: 440 | Disposition: A | Discharge status: Home / Self Care | Payer: Medicare Other | Attending: Family Medicine | Admitting: Family Medicine

## 2015-01-08 ENCOUNTER — Other Ambulatory Visit (HOSPITAL_COMMUNITY): Payer: Self-pay

## 2015-01-08 ENCOUNTER — Encounter (HOSPITAL_COMMUNITY): Payer: Self-pay

## 2015-01-08 DIAGNOSIS — K859 Acute pancreatitis without necrosis or infection, unspecified: Secondary | ICD-10-CM | POA: Diagnosis present

## 2015-01-08 DIAGNOSIS — R739 Hyperglycemia, unspecified: Secondary | ICD-10-CM | POA: Diagnosis not present

## 2015-01-08 DIAGNOSIS — Z7982 Long term (current) use of aspirin: Secondary | ICD-10-CM

## 2015-01-08 DIAGNOSIS — E785 Hyperlipidemia, unspecified: Secondary | ICD-10-CM | POA: Diagnosis present

## 2015-01-08 DIAGNOSIS — I10 Essential (primary) hypertension: Secondary | ICD-10-CM | POA: Diagnosis present

## 2015-01-08 DIAGNOSIS — E780 Pure hypercholesterolemia: Secondary | ICD-10-CM | POA: Diagnosis present

## 2015-01-08 DIAGNOSIS — Z823 Family history of stroke: Secondary | ICD-10-CM

## 2015-01-08 DIAGNOSIS — R1013 Epigastric pain: Secondary | ICD-10-CM | POA: Diagnosis present

## 2015-01-08 DIAGNOSIS — D649 Anemia, unspecified: Secondary | ICD-10-CM | POA: Diagnosis present

## 2015-01-08 LAB — CBC WITH DIFFERENTIAL/PLATELET
BASOS ABS: 0 10*3/uL (ref 0.0–0.1)
BASOS PCT: 0 % (ref 0–1)
EOS PCT: 1 % (ref 0–5)
Eosinophils Absolute: 0.1 10*3/uL (ref 0.0–0.7)
HCT: 36.6 % (ref 36.0–46.0)
Hemoglobin: 11.9 g/dL — ABNORMAL LOW (ref 12.0–15.0)
LYMPHS ABS: 2.3 10*3/uL (ref 0.7–4.0)
Lymphocytes Relative: 27 % (ref 12–46)
MCH: 29.3 pg (ref 26.0–34.0)
MCHC: 32.5 g/dL (ref 30.0–36.0)
MCV: 90.1 fL (ref 78.0–100.0)
MONO ABS: 0.8 10*3/uL (ref 0.1–1.0)
Monocytes Relative: 10 % (ref 3–12)
NEUTROS ABS: 5.2 10*3/uL (ref 1.7–7.7)
NEUTROS PCT: 62 % (ref 43–77)
PLATELETS: 210 10*3/uL (ref 150–400)
RBC: 4.06 MIL/uL (ref 3.87–5.11)
RDW: 13.6 % (ref 11.5–15.5)
WBC: 8.3 10*3/uL (ref 4.0–10.5)

## 2015-01-08 LAB — COMPREHENSIVE METABOLIC PANEL
ALK PHOS: 75 U/L (ref 39–117)
ALT: 17 U/L (ref 0–35)
ANION GAP: 7 (ref 5–15)
AST: 27 U/L (ref 0–37)
Albumin: 4.2 g/dL (ref 3.5–5.2)
BILIRUBIN TOTAL: 0.4 mg/dL (ref 0.3–1.2)
BUN: 18 mg/dL (ref 6–23)
CO2: 26 mmol/L (ref 19–32)
CREATININE: 1.06 mg/dL (ref 0.50–1.10)
Calcium: 9.5 mg/dL (ref 8.4–10.5)
Chloride: 104 mmol/L (ref 96–112)
GFR calc Af Amer: 56 mL/min — ABNORMAL LOW (ref >90)
GFR, EST NON AFRICAN AMERICAN: 49 mL/min — AB (ref >90)
Glucose, Bld: 195 mg/dL — ABNORMAL HIGH (ref 70–99)
Potassium: 3.5 mmol/L (ref 3.5–5.1)
SODIUM: 137 mmol/L (ref 135–145)
Total Protein: 7.7 g/dL (ref 6.0–8.3)

## 2015-01-08 LAB — URINALYSIS, ROUTINE W REFLEX MICROSCOPIC
Bilirubin Urine: NEGATIVE
Glucose, UA: >1000 mg/dL — AB
Ketones, ur: NEGATIVE mg/dL
Leukocytes, UA: NEGATIVE
Nitrite: NEGATIVE
Protein, ur: NEGATIVE mg/dL
Specific Gravity, Urine: 1.025 (ref 1.005–1.030)
Urobilinogen, UA: 0.2 mg/dL (ref 0.0–1.0)
pH: 5.5 (ref 5.0–8.0)

## 2015-01-08 LAB — ETHANOL

## 2015-01-08 LAB — URINE MICROSCOPIC-ADD ON

## 2015-01-08 LAB — LIPASE, BLOOD: LIPASE: 812 U/L — AB (ref 11–59)

## 2015-01-08 MED ORDER — ONDANSETRON HCL 4 MG/2ML IJ SOLN: 4.0000 mg | Freq: Four times a day (QID) | INTRAMUSCULAR | Status: DC | PRN

## 2015-01-08 MED ORDER — MORPHINE SULFATE 2 MG/ML IJ SOLN
2.0000 mg | INTRAMUSCULAR | Status: DC | PRN
  Administered 2015-01-08: 2 mg via INTRAVENOUS
  Filled 2015-01-08: qty 1

## 2015-01-08 MED ORDER — SODIUM CHLORIDE 0.9 % IV SOLN
INTRAVENOUS | Status: DC
  Administered 2015-01-09 – 2015-01-10 (×4): via INTRAVENOUS

## 2015-01-08 MED ORDER — ONDANSETRON HCL 4 MG/2ML IJ SOLN
4.0000 mg | Freq: Once | INTRAMUSCULAR | Status: AC
  Administered 2015-01-08: 4 mg via INTRAMUSCULAR
  Filled 2015-01-08: qty 2

## 2015-01-08 MED ORDER — SODIUM CHLORIDE 0.9 % IV SOLN
Freq: Once | INTRAVENOUS | Status: AC
  Administered 2015-01-08: 19:00:00 via INTRAVENOUS

## 2015-01-08 MED ORDER — LOSARTAN POTASSIUM 50 MG PO TABS
50.0000 mg | ORAL_TABLET | Freq: Every day | ORAL | Status: DC
  Administered 2015-01-09 – 2015-01-10 (×2): 50 mg via ORAL
  Filled 2015-01-08 (×2): qty 1

## 2015-01-08 MED ORDER — MORPHINE SULFATE 4 MG/ML IJ SOLN
4.0000 mg | INTRAMUSCULAR | Status: DC | PRN
  Filled 2015-01-08: qty 1

## 2015-01-08 MED ORDER — ENOXAPARIN SODIUM 40 MG/0.4ML ~~LOC~~ SOLN
40.0000 mg | SUBCUTANEOUS | Status: DC
  Administered 2015-01-09: 40 mg via SUBCUTANEOUS
  Filled 2015-01-08: qty 0.4

## 2015-01-08 MED ORDER — MORPHINE SULFATE 4 MG/ML IJ SOLN
4.0000 mg | Freq: Once | INTRAMUSCULAR | Status: AC
  Administered 2015-01-08: 4 mg via INTRAVENOUS
  Filled 2015-01-08: qty 1

## 2015-01-08 MED ORDER — ONDANSETRON HCL 4 MG PO TABS: 4.0000 mg | ORAL_TABLET | Freq: Four times a day (QID) | ORAL | Status: DC | PRN

## 2015-01-08 MED ORDER — HYDRALAZINE HCL 20 MG/ML IJ SOLN: 5.0000 mg | INTRAMUSCULAR | Status: DC | PRN

## 2015-01-08 NOTE — ED Notes (Signed)
Onset Monday, aching all over, pt had new onset pancreatitis in Feb and feels like she has not recovered, denies, vomiting or diarrhea

## 2015-01-08 NOTE — H&P (Signed)
[facility] History and Physical  Veronica Stevenson YQM:[id number] DOB: Mar 19, 1933 DOA: 01/08/2015  Referring physician: Dr. Dina Rich - [facility] PCP: Erma Pinto, FNP   Chief Complaint: Abd pain  HPI: Veronica Stevenson is a 81 y.o. female  Abdominal pain. Started 3 days ago. Comes and goes. Epigastric without radiation. Dull, achy and sharp. Constant but gradual worsening. Improves on a full stomach per patient, but returns a short time later. Denies vomiting, diarrhea, nausea, fevers, rash, CP, palpitations. Tramadol with relief. States that she was diagnosed with pancreatitis in early February in [place] but did not require admission. Told to f/u w/ GI but unable to do so.    Review of Systems:  Constitutional:  No weight loss, night sweats, Fevers, chills, fatigue.  HEENT:  No headaches, Difficulty swallowing,Tooth/dental problems,Sore throat,  No sneezing, itching, ear ache, nasal congestion, post nasal drip,  Cardio-vascular:  No chest pain, Orthopnea, PND, swelling in lower extremities, anasarca, dizziness, palpitations  GI:  Per HPI Resp:   No shortness of breath with exertion or at rest. No excess mucus, no productive cough, No non-productive cough, No coughing up of blood.No change in color of mucus.No wheezing.No chest wall deformity  Skin:  no rash or lesions.  GU:  no dysuria, change in color of urine, no urgency or frequency. No flank pain.  Musculoskeletal:   No joint pain or swelling. No decreased range of motion. No back pain.  Psych:  No change in mood or affect. No depression or anxiety. No memory loss.   Past Medical History  Diagnosis Date  . Hypertension   . High cholesterol   . Bronchitis    Past Surgical History  Procedure Laterality Date  . Cardiac catheterization     Social History:  reports that she has never smoked. She has never used smokeless tobacco. She reports that she does not drink alcohol or use illicit drugs.  Allergies  Allergen  Reactions  . Codeine Itching  . Penicillins Other (See Comments)    "blacked out"    Family History  Problem Relation Age of Onset  . Stroke Mother   . Stroke Father      Prior to Admission medications   Medication Sig Start Date End Date Taking? Authorizing Provider  acetaminophen (TYLENOL) 500 MG tablet Take 500 mg by mouth every 6 (six) hours as needed for mild pain or moderate pain.   Yes Historical Provider, MD  aspirin EC 81 MG tablet Take 81 mg by mouth every morning.   Yes Historical Provider, MD  Cholecalciferol (VITAMIN D) 2000 UNITS CAPS Take 1 capsule by mouth daily.   Yes Historical Provider, MD  losartan (COZAAR) 50 MG tablet Take 50 mg by mouth daily.   Yes Historical Provider, MD  metoprolol succinate (TOPROL-XL) 25 MG 24 hr tablet Take 25 mg by mouth daily.    Yes Historical Provider, MD  simvastatin (ZOCOR) 40 MG tablet Take 40 mg by mouth every evening.   Yes Historical Provider, MD   Physical Exam: Filed Vitals:   01/08/15 1602 01/08/15 1840 01/08/15 1900  BP: 161/74 162/71 165/83  Pulse: 78 64 64  Temp: 97.8 F (36.6 C)    TempSrc: Oral    Resp:  18 17  Height: 5\' 4"  (1.626 m)    Weight: 64.411 kg (142 lb)    SpO2: 97% 99% 96%    Wt Readings from Last 3 Encounters:  01/08/15 64.411 kg (142 lb)  07/26/13 64.411 kg (142 lb)  03/26/12  65.318 kg (144 lb)    General:  Appears calm and comfortable Eyes: PERRL, normal lids, irises & conjunctiva ENT:  grossly normal hearing, lips & tongue Neck:  no LAD, masses or thyromegaly Cardiovascular:  RRR, no m/r/g. No LE edema. Telemetry:  SR, no arrhythmias  Respiratory:  CTA bilaterally, no w/r/r. Normal respiratory effort. Abdomen:  soft, ntnd Skin: no rash or induration seen on limited exam Musculoskeletal:  grossly normal tone BUE/BLE Psychiatric:  grossly normal mood and affect, speech fluent and appropriate Neurologic:  grossly non-focal.          Labs on Admission:  Basic Metabolic Panel:  Recent  Labs Lab 01/08/15 1648  NA 137  K 3.5  CL 104  CO2 26  GLUCOSE 195*  BUN 18  CREATININE 1.06  CALCIUM 9.5   Liver Function Tests:  Recent Labs Lab 01/08/15 1648  AST 27  ALT 17  ALKPHOS 75  BILITOT 0.4  PROT 7.7  ALBUMIN 4.2    Recent Labs Lab 01/08/15 1648  LIPASE 812*   No results for input(s): AMMONIA in the last 168 hours. CBC:  Recent Labs Lab 01/08/15 1648  WBC 8.3  NEUTROABS 5.2  HGB 11.9*  HCT 36.6  MCV 90.1  PLT 210   Cardiac Enzymes: No results for input(s): CKTOTAL, CKMB, CKMBINDEX, TROPONINI in the last 168 hours.  BNP (last 3 results) No results for input(s): BNP in the last 8760 hours.  ProBNP (last 3 results) No results for input(s): PROBNP in the last 8760 hours.  CBG: No results for input(s): GLUCAP in the last 168 hours.  Radiological Exams on Admission: No results found.  EKG: Independently reviewed. Sinus, no sign of ACS  Assessment/Plan Principal Problem:   Acute pancreatitis Active Problems:   HLD (hyperlipidemia)   Essential hypertension   Hyperglycemia    Abdominal pain: Likely secondary to pancreatitis. Lipase 812. No imaging performed. Abdomen nontender at time of exam. Denies alcohol use. AVSS, WBC 8.3 - Abdominal ultrasound - Nothing by mouth - Lipid panel - EtOH - Morphine - Normal saline 125 mL per hour - Lipase in am (typically would use clinical improvement instead of labs to determine if pt ready for DC but as pts pain is atypical, will likely need objective measure)  Hyperglycemia: Glucose 196 and greater than 1000mg /dL glucose in the urine - A1c  Hypertension: Normotensive to slightly elevated on presentation - Hold losartan and metoprolol until able to take PO - Hydralazine when necessary SBP > 180  HLD: - continue statin once taking PO   Code Status: FULL DVT Prophylaxis: Lovenox Family Communication: none Disposition Plan: pending improvement  Veronica Colasurdo Lenna Sciara, MD [facility] [email or web address] Password [email or web address]

## 2015-01-08 NOTE — ED Provider Notes (Signed)
CSN: [id number]     Arrival date & time 01/08/15  1558 History   First MD Initiated Contact with Patient 01/08/15 1607     Chief Complaint  Patient presents with  . Pancreatitis     (Consider location/radiation/quality/duration/timing/severity/associated sxs/prior Treatment) HPI \ This is a 81 year old female with a history of hypertension, hyperlipidemia, and recent history of acute pancreatitis who presents with abdominal pain. Patient reports a three-day history of worsening, pain and "hurting all over." Rates her pain at 10 out of 10. It is epigastric and nonradiating. She states that her stomach feels better when she is full. She denies vomiting or diarrhea. She has taken tramadol without any relief. She states that she she has not been able to follow-up with GI yet. Denies any fevers, chest pain, shortness breath.  Patient reports that she was diagnosed in [place] in the ER for pancreatitis but was not admitted. She is unsure why she developed pancreatitis.  Past Medical History  Diagnosis Date  . Hypertension   . High cholesterol   . Bronchitis    Past Surgical History  Procedure Laterality Date  . Cardiac catheterization     Family History  Problem Relation Age of Onset  . Stroke Mother   . Stroke Father    History  Substance Use Topics  . Smoking status: Never Smoker   . Smokeless tobacco: Never Used  . Alcohol Use: No   OB History    Gravida Para Term Preterm AB TAB SAB Ectopic Multiple Living            0     Review of Systems  Constitutional: Negative for fever.  Respiratory: Negative for cough, chest tightness and shortness of breath.   Cardiovascular: Negative for chest pain.  Gastrointestinal: Positive for abdominal pain. Negative for nausea, vomiting and diarrhea.  Genitourinary: Negative for dysuria.  Musculoskeletal: Negative for back pain.  Neurological: Negative for headaches.  Psychiatric/Behavioral: Negative for confusion.  All other systems  reviewed and are negative.     Allergies  Codeine and Penicillins  Home Medications   Prior to Admission medications   Medication Sig Start Date End Date Taking? Authorizing Provider  acetaminophen (TYLENOL) 500 MG tablet Take 500 mg by mouth every 6 (six) hours as needed for mild pain or moderate pain.   Yes Historical Provider, MD  aspirin EC 81 MG tablet Take 81 mg by mouth every morning.   Yes Historical Provider, MD  Cholecalciferol (VITAMIN D) 2000 UNITS CAPS Take 1 capsule by mouth daily.   Yes Historical Provider, MD  losartan (COZAAR) 50 MG tablet Take 50 mg by mouth daily.   Yes Historical Provider, MD  metoprolol succinate (TOPROL-XL) 25 MG 24 hr tablet Take 25 mg by mouth daily.    Yes Historical Provider, MD  simvastatin (ZOCOR) 40 MG tablet Take 40 mg by mouth every evening.   Yes Historical Provider, MD   BP 161/74 mmHg  Pulse 78  Temp(Src) 97.8 F (36.6 C) (Oral)  Ht 5\' 4"  (1.626 m)  Wt 142 lb (64.411 kg)  BMI 24.36 kg/m2  SpO2 97% Physical Exam  Constitutional: She is oriented to person, place, and time. She appears well-developed and well-nourished. No distress.  HENT:  Head: Normocephalic and atraumatic.  Cardiovascular: Normal rate, regular rhythm and normal heart sounds.   No murmur heard. Pulmonary/Chest: Effort normal and breath sounds normal. No respiratory distress. She has no wheezes.  Abdominal: Soft. Bowel sounds are normal. There is no rebound and  no guarding.  Mild epigastric tenderness to palpation without rebound or guarding  Musculoskeletal: She exhibits no edema.  Neurological: She is alert and oriented to person, place, and time.  Skin: Skin is warm and dry.  Psychiatric: She has a normal mood and affect.  Nursing note and vitals reviewed.   ED Course  Procedures (including critical care time) Labs Review Labs Reviewed  CBC WITH DIFFERENTIAL/PLATELET - Abnormal; Notable for the following:    Hemoglobin 11.9 (*)    All other  components within normal limits  COMPREHENSIVE METABOLIC PANEL - Abnormal; Notable for the following:    Glucose, Bld 195 (*)    GFR calc non Af Amer 49 (*)    GFR calc Af Amer 56 (*)    All other components within normal limits  LIPASE, BLOOD - Abnormal; Notable for the following:    Lipase 812 (*)    All other components within normal limits  URINALYSIS, ROUTINE W REFLEX MICROSCOPIC - Abnormal; Notable for the following:    Glucose, UA >1000 (*)    Hgb urine dipstick SMALL (*)    All other components within normal limits  URINE MICROSCOPIC-ADD ON    Imaging Review No results found.   EKG Interpretation   Date/Time:  Wednesday January 08 2015 17:57:23 EDT Ventricular Rate:  61 PR Interval:  147 QRS Duration: 82 QT Interval:  397 QTC Calculation: 400 R Axis:   74 Text Interpretation:  Sinus rhythm Consider right atrial enlargement  Confirmed by Lennie Vasco  MD, Loma Sousa ([id number]) on 01/08/2015 6:06:10 PM      MDM   Final diagnoses:  Acute pancreatitis, unspecified pancreatitis type    Patient presents with epigastric pain which she feels is consistent with prior episode of pancreatitis. Nontoxic on exam. Minimal tenderness and no signs of peritonitis. Patient given pain and nausea medications. Lab work notable for lipase of 812 otherwise LFTs are reassuring. On repeat exam, patient reports that she is much improved after pain and nausea medication. Etiology for pancreatitis is unknown at this time given benign abdominal exam will defer further imaging until discussion with hospitalist.  Right upper quadrant ultrasound ordered. Lipid testing to be ordered by hospitalist. Patient to be admitted by Dr. Marily Memos.  Merryl Hacker, MD 01/08/15 [phone]

## 2015-01-09 ENCOUNTER — Encounter (HOSPITAL_COMMUNITY): Payer: Self-pay | Admitting: Internal Medicine

## 2015-01-09 ENCOUNTER — Inpatient Hospital Stay (HOSPITAL_COMMUNITY): Payer: Medicare Other

## 2015-01-09 DIAGNOSIS — D649 Anemia, unspecified: Secondary | ICD-10-CM | POA: Diagnosis present

## 2015-01-09 LAB — CBC
HCT: 34.3 % — ABNORMAL LOW (ref 36.0–46.0)
Hemoglobin: 11 g/dL — ABNORMAL LOW (ref 12.0–15.0)
MCH: 28.9 pg (ref 26.0–34.0)
MCHC: 32.1 g/dL (ref 30.0–36.0)
MCV: 90 fL (ref 78.0–100.0)
PLATELETS: 173 10*3/uL (ref 150–400)
RBC: 3.81 MIL/uL — ABNORMAL LOW (ref 3.87–5.11)
RDW: 13.8 % (ref 11.5–15.5)
WBC: 6.1 10*3/uL (ref 4.0–10.5)

## 2015-01-09 LAB — COMPREHENSIVE METABOLIC PANEL
ALBUMIN: 3.2 g/dL — AB (ref 3.5–5.2)
ALT: 16 U/L (ref 0–35)
AST: 20 U/L (ref 0–37)
Alkaline Phosphatase: 60 U/L (ref 39–117)
Anion gap: 5 (ref 5–15)
BUN: 14 mg/dL (ref 6–23)
CALCIUM: 8.7 mg/dL (ref 8.4–10.5)
CO2: 26 mmol/L (ref 19–32)
Chloride: 110 mmol/L (ref 96–112)
Creatinine, Ser: 0.89 mg/dL (ref 0.50–1.10)
GFR calc Af Amer: 70 mL/min — ABNORMAL LOW (ref >90)
GFR calc non Af Amer: 60 mL/min — ABNORMAL LOW (ref >90)
Glucose, Bld: 95 mg/dL (ref 70–99)
POTASSIUM: 3.5 mmol/L (ref 3.5–5.1)
Sodium: 141 mmol/L (ref 135–145)
TOTAL PROTEIN: 6.3 g/dL (ref 6.0–8.3)
Total Bilirubin: 0.5 mg/dL (ref 0.3–1.2)

## 2015-01-09 LAB — LIPASE, BLOOD: Lipase: 361 U/L — ABNORMAL HIGH (ref 11–59)

## 2015-01-09 LAB — LIPID PANEL
CHOLESTEROL: 110 mg/dL (ref 0–200)
HDL: 50 mg/dL (ref >39)
LDL Cholesterol: 49 mg/dL (ref 0–99)
Total CHOL/HDL Ratio: 2.2 RATIO
Triglycerides: 57 mg/dL (ref <150)
VLDL: 11 mg/dL (ref 0–40)

## 2015-01-09 MED ORDER — MORPHINE SULFATE 2 MG/ML IJ SOLN
1.0000 mg | INTRAMUSCULAR | Status: DC | PRN
  Administered 2015-01-09: 1 mg via INTRAVENOUS

## 2015-01-09 NOTE — Progress Notes (Signed)
UR chart review completed.  

## 2015-01-09 NOTE — Progress Notes (Signed)
This serves as my attestation of 3/31 progress note K. Black.  81 year old woman presented with epigastric pain. History of pancreatitis February 2016 but was not admitted at that time. Instructed to follow-up with GI but has been unable to do so. Workup in the emergency department suggested uncomplicated acute pancreatitis.  PMH Pancreatitis February 2016  Interval History: Feels better but had some pain with lunch.  Objective: afebrile, vital signs are stable. No hypoxia.   General: Appears calm and comfortable  Cardiovascular: RRR, no m/r/g. No LE edema.  Respiratory: CTA bilaterally, no w/r/r. Normal respiratory effort.  Abdomen: soft, nd, mild epigastric pain  Psychiatric: grossly normal mood and affect, speech fluent and appropriate   CMP unremarkable    lipase 812  >> 361    lipid panel unremarkable CBC unremarkable, hemoglobin 11.0, stable Serum alcohol unremarkable EKG sinus rhythm, no acute changes Right upper quadrant ultrasound unremarkable    Acute pancreatitis, etiology unclear. Clinically resolving but has some pain with food, will back off on diet. Check labs in AM.  Murray Hodgkins, MD [facility] [phone]

## 2015-01-09 NOTE — Progress Notes (Signed)
[facility] PROGRESS NOTE  Veronica Stevenson [id number]:[id number] DOB: 1932/12/27 DOA: 01/08/2015 PCP: Erma Pinto, FNP  Assessment/Plan: Abdominal pain: Likely secondary to acute pancreatitis etiology unclear. Denies ETOH, lipid panel within limits of normal. Abdominal US unremarkable. Lipase today down to 316 from 812 on admission. Patient reported much less pain this am. Diet advanced to full liquid. She consumed 100% of full liquid and developed worsening abdominal pain. Will resume NPO status and continue IV fluids as well as analgesic. Repeat lipase in am.   Hyperglycemia: Glucose 196 and greater than 1000mg /dL glucose in the urine on admission. Denies hx diabetes. Serum glucose 95 this am.  Await  A1c. Monitor.  Hypertension: remains normotensive to slightly elevated. Losartan resumed but holding metoprolol. Will provide prn hydralazine.   HLD: lipid panel within limits of normal. Continue statin once taking PO  Anemia: mild. May be dilutional. No hx of same. Monitor.   Code Status: full Family Communication: none present Disposition Plan: home when ready   Consultants:  none  Procedures:  none  Antibiotics:  none  HPI/Subjective: Ambulating in room with steady gait. Reports pain much improved before eating lunch  Objective: Filed Vitals:   01-16-2015 1430  BP: 155/71  Pulse: 81  Temp: 98.4 F (36.9 C)  Resp: 16    Intake/Output Summary (Last 24 hours) at January 16, 2015 1451 Last data filed at 01/16/15 0800  Gross per 24 hour  Intake 1141.67 ml  Output      0 ml  Net 1141.67 ml   Filed Weights   01/08/15 1602 01/08/15 01/16/2012  Weight: 64.411 kg (142 lb) 63.3 kg (139 lb 8.8 oz)    Exam:   General:  Well nourished appears comfortable  Cardiovascular: RRR no MGR no LE edema  Respiratory: normal effort BS clear bilaterally to ausculation no wheeze  Abdomen: non-distended, soft +BS but sluggish mild tenderness in RUQ  Musculoskeletal: joints without  swelling/erythema   Data Reviewed: Basic Metabolic Panel:  Recent Labs Lab 01/08/15 1648 2015/01/16 0550  NA 137 141  K 3.5 3.5  CL 104 110  CO2 26 26  GLUCOSE 195* 95  BUN 18 14  CREATININE 1.06 0.89  CALCIUM 9.5 8.7   Liver Function Tests:  Recent Labs Lab 01/08/15 1648 01/16/15 0550  AST 27 20  ALT 17 16  ALKPHOS 75 60  BILITOT 0.4 0.5  PROT 7.7 6.3  ALBUMIN 4.2 3.2*    Recent Labs Lab 01/08/15 1648 2015-01-16 0550  LIPASE 812* 361*   No results for input(s): AMMONIA in the last 168 hours. CBC:  Recent Labs Lab 01/08/15 1648 01/16/15 0550  WBC 8.3 6.1  NEUTROABS 5.2  --   HGB 11.9* 11.0*  HCT 36.6 34.3*  MCV 90.1 90.0  PLT 210 173   Cardiac Enzymes: No results for input(s): CKTOTAL, CKMB, CKMBINDEX, TROPONINI in the last 168 hours. BNP (last 3 results) No results for input(s): BNP in the last 8760 hours.  ProBNP (last 3 results) No results for input(s): PROBNP in the last 8760 hours.  CBG: No results for input(s): GLUCAP in the last 168 hours.  No results found for this or any previous visit (from the past 240 hour(s)).   Studies: US Abdomen Limited Ruq  16-Jan-2015   CLINICAL DATA:  Acute pancreatitis, abdominal pain  EXAM: US ABDOMEN LIMITED - RIGHT UPPER QUADRANT  COMPARISON:  None.  FINDINGS: Gallbladder:  No gallstones or wall thickening visualized. No sonographic Murphy sign noted.  Common bile duct:  Diameter:  2.0 mm within normal limits  Liver:  No focal lesion identified. Within normal limits in parenchymal echogenicity.  IMPRESSION: Unremarkable right upper quadrant ultrasound.   Electronically Signed   By: Lahoma Crocker M.D.   On: 01/09/2015 08:30    Scheduled Meds: . enoxaparin (LOVENOX) injection  40 mg Subcutaneous Q24H  . losartan  50 mg Oral Daily   Continuous Infusions: . sodium chloride 125 mL/hr at 01/09/15 1013    Principal Problem:   Acute pancreatitis Active Problems:   HLD (hyperlipidemia)   Essential hypertension    Hyperglycemia   Anemia    Time spent: 35 minutes    [facility] Pager [phone]. If 7PM-7AM, please contact night-coverage at [email or web address], password [facility] 01/09/2015, 2:51 PM  LOS: 1 day

## 2015-01-09 NOTE — Care Management Note (Addendum)
    Page 1 of 1   01/10/2015     10:19:43 AM CARE MANAGEMENT NOTE 01/10/2015  Patient:  KENNIS, BUELL   Account Number:  [id number]  Date Initiated:  01/09/2015  Documentation initiated by:  Theophilus Kinds  Subjective/Objective Assessment:   Pt admitted from home with pancreatitis. Pt lives alone and will return home at discharge. Pt is independent with ADL's.Pt has a nephew that is available to assist pt as needed.     Action/Plan:   No Cm needs noted.   Anticipated DC Date:  01/12/2015   Anticipated DC Plan:  [place]  CM consult      Choice offered to / List presented to:             Status of service:  Completed, signed off Medicare Important Message given?  NA - LOS <3 / Initial given by admissions (If response is "NO", the following Medicare IM given date fields will be blank) Date Medicare IM given:   Medicare IM given by:   Date Additional Medicare IM given:   Additional Medicare IM given by:    Discharge Disposition:  HOME/SELF CARE  Per UR Regulation:    If discussed at Long Length of Stay Meetings, dates discussed:    Comments:  01/10/15 [place], RN BSN CM Pt discharged home today. No Cm needs noted.  01/09/15 Lewis, RN BSN CM

## 2015-01-10 ENCOUNTER — Telehealth: Payer: Self-pay | Admitting: Nurse Practitioner

## 2015-01-10 LAB — GLUCOSE, CAPILLARY: Glucose-Capillary: 96 mg/dL (ref 70–99)

## 2015-01-10 LAB — HEMOGLOBIN A1C
HEMOGLOBIN A1C: 6.7 % — AB (ref 4.8–5.6)
Mean Plasma Glucose: 146 mg/dL

## 2015-01-10 LAB — CBC
HCT: 35.2 % — ABNORMAL LOW (ref 36.0–46.0)
Hemoglobin: 11.3 g/dL — ABNORMAL LOW (ref 12.0–15.0)
MCH: 29 pg (ref 26.0–34.0)
MCHC: 32.1 g/dL (ref 30.0–36.0)
MCV: 90.5 fL (ref 78.0–100.0)
PLATELETS: 173 10*3/uL (ref 150–400)
RBC: 3.89 MIL/uL (ref 3.87–5.11)
RDW: 13.9 % (ref 11.5–15.5)
WBC: 6.5 10*3/uL (ref 4.0–10.5)

## 2015-01-10 LAB — LIPASE, BLOOD: Lipase: 327 U/L — ABNORMAL HIGH (ref 11–59)

## 2015-01-10 NOTE — Telephone Encounter (Signed)
Called her back and made patient appointment for 01/29/15.

## 2015-01-10 NOTE — Telephone Encounter (Signed)
PATIENT WAS INPATIENT AND NP AT [place] CALLED STATING THAT SHE WOULD LIKE TO BE SEEN HERE,  CAN WE TAKE HER ON AS A NEW PATIENT.  HER PCP SENT HER INFO TO A GASTRO DOCTOR IN [place] BUT PATIENT WANTS TO BE SEEN HERE.  PLEASE ADVISE.

## 2015-01-10 NOTE — Telephone Encounter (Signed)
Spoke with Dyanne Carrel hospitalist NP about this patient. She has not seen GI before and is requesting a  GI. No previous GI outpatient visits, ok to schedule

## 2015-01-10 NOTE — Discharge Summary (Signed)
Physician Discharge Summary  NATANYA HOLECEK OZY:[id number] DOB: 06-10-1933 DOA: 01/08/2015  PCP: Erma Pinto, FNP  Admit date: 01/08/2015 Discharge date: 01/10/2015  Time spent: 40 minutes  Recommendations for Outpatient Follow-up:  1. [facility] will contact patient with follow up appointment for evaluation of recurrent pancreatitis 2. PCP 1-2 weeks follow up on HgA1c of 6.7 and lipid panel as zocor discontinued.   Discharge Diagnoses:  Principal Problem:   Acute pancreatitis Active Problems:   HLD (hyperlipidemia)   Essential hypertension   Hyperglycemia   Anemia   Discharge Condition: stable  Diet recommendation: clear liquids on day of discharge to be advanced slowly as tolerated. Low fat diet  Filed Weights   01/08/15 1602 01/08/15 2013  Weight: 64.411 kg (142 lb) 63.3 kg (139 lb 8.8 oz)    History of present illness:  Veronica Stevenson is a delightful 81 y.o. female presented to ED on 01/08/15 with cc abdominal pain. Started 3 days proior. Comes and goes. Epigastric without radiation. Dull, achy and sharp. Constant but gradual worsening. Improved on a full stomach per patient, but returned a short time later. Denied vomiting, diarrhea, nausea, fevers, rash, CP, palpitations. Tramadol with relief. Stated that she was diagnosed with pancreatitis in early February in [place] but did not require admission. Told to f/u w/ GI but unable to do so.   Hospital Course:  Abdominal pain: Likely secondary to acute pancreatitis etiology unclear. Denies ETOH, lipid panel within limits of normal. Abdominal US unremarkable. Provided with IV fluids and bowel rest.  Lipase down to 327 on day of discharge from 812 on admission. Patient reported much less pain and tolerating clear liquids. Will discontinue Zocor at discharge.  [facility] will contact patient for follow up appointment   Hyperglycemia: Glucose 196 and greater than 1000mg /dL glucose in the urine  on admission. Denies hx diabetes. Fasting glucose 96 at discharge.  A1c 6.7. OP follow up.   Hypertension: controlled   HLD: lipid panel within limits of normal. Will stop zocor as noted above. Recommend OP follow up with PCP track lipids  Anemia: mild. OP follow up.     Procedures:  none  Consultations:  none  Discharge Exam: Filed Vitals:   01/10/15 0913  BP: 167/68  Pulse: 73  Temp:   Resp: 18    General: well nourished appears comfortable Cardiovascular: RRR No MGR No LE edema Respiratory: normal effort BS clear bilaterally no wheeze Abdomen: non-distended +BS only mild tenderness RUQ. No guarding or rebounding  Discharge Instructions   Discharge Instructions    Call MD for:  severe uncontrolled pain    Complete by:  As directed      Call MD for:  temperature >100.4    Complete by:  As directed      Diet - low sodium heart healthy    Complete by:  As directed      Discharge instructions    Complete by:  As directed   Continue clear liquids today.  Advance diet slowly. Low fat diet Follow up with GI as scheduled.     Increase activity slowly    Complete by:  As directed           Current Discharge Medication List    CONTINUE these medications which have NOT CHANGED   Details  acetaminophen (TYLENOL) 500 MG tablet Take 500 mg by mouth every 6 (six) hours as needed for mild pain or moderate pain.    aspirin EC 81 MG tablet  Take 81 mg by mouth every morning.    Cholecalciferol (VITAMIN D) 2000 UNITS CAPS Take 1 capsule by mouth daily.    losartan (COZAAR) 50 MG tablet Take 50 mg by mouth daily.    metoprolol succinate (TOPROL-XL) 25 MG 24 hr tablet Take 25 mg by mouth daily.       STOP taking these medications     simvastatin (ZOCOR) 40 MG tablet        Allergies  Allergen Reactions  . Codeine Itching  . Penicillins Other (See Comments)    "blacked out"      The results of significant diagnostics from this hospitalization (including  imaging, microbiology, ancillary and laboratory) are listed below for reference.    Significant Diagnostic Studies: US Abdomen Limited Ruq  01-25-15   CLINICAL DATA:  Acute pancreatitis, abdominal pain  EXAM: US ABDOMEN LIMITED - RIGHT UPPER QUADRANT  COMPARISON:  None.  FINDINGS: Gallbladder:  No gallstones or wall thickening visualized. No sonographic Murphy sign noted.  Common bile duct:  Diameter: 2.0 mm within normal limits  Liver:  No focal lesion identified. Within normal limits in parenchymal echogenicity.  IMPRESSION: Unremarkable right upper quadrant ultrasound.   Electronically Signed   By: Lahoma Crocker M.D.   On: 01/25/2015 08:30    Microbiology: No results found for this or any previous visit (from the past 240 hour(s)).   Labs: Basic Metabolic Panel:  Recent Labs Lab 01/08/15 1648 2015-01-25 0550  NA 137 141  K 3.5 3.5  CL 104 110  CO2 26 26  GLUCOSE 195* 95  BUN 18 14  CREATININE 1.06 0.89  CALCIUM 9.5 8.7   Liver Function Tests:  Recent Labs Lab 01/08/15 1648 2015-01-25 0550  AST 27 20  ALT 17 16  ALKPHOS 75 60  BILITOT 0.4 0.5  PROT 7.7 6.3  ALBUMIN 4.2 3.2*    Recent Labs Lab 01/08/15 1648 Jan 25, 2015 0550 01/10/15 0716  LIPASE 812* 361* 327*   No results for input(s): AMMONIA in the last 168 hours. CBC:  Recent Labs Lab 01/08/15 1648 01-25-2015 0550 01/10/15 0716  WBC 8.3 6.1 6.5  NEUTROABS 5.2  --   --   HGB 11.9* 11.0* 11.3*  HCT 36.6 34.3* 35.2*  MCV 90.1 90.0 90.5  PLT 210 173 173   Cardiac Enzymes: No results for input(s): CKTOTAL, CKMB, CKMBINDEX, TROPONINI in the last 168 hours. BNP: BNP (last 3 results) No results for input(s): BNP in the last 8760 hours.  ProBNP (last 3 results) No results for input(s): PROBNP in the last 8760 hours.  CBG:  Recent Labs Lab 01/10/15 0646  GLUCAP 96       Signed:  Demetrice Combes M  [facility] 01/10/2015, 10:19 AM

## 2015-01-10 NOTE — Progress Notes (Signed)
Pt's IV catheter removed and intact. Pt's IV site clean dry and intact. Discharge instructions reviewed and discussed with patient. Medications reviewed and discussed with patient. All follow up appointments discussed with patient and all questions were answered. No further questions at this time. Pt escorted by nurse tech.

## 2015-01-23 ENCOUNTER — Ambulatory Visit (INDEPENDENT_AMBULATORY_CARE_PROVIDER_SITE_OTHER): Payer: Medicare Other | Admitting: Nurse Practitioner

## 2015-01-23 ENCOUNTER — Encounter: Payer: Self-pay | Admitting: Nurse Practitioner

## 2015-01-23 VITALS — BP 155/80 | HR 67 | Temp 97.9°F | Ht 64.0 in | Wt 135.0 lb

## 2015-01-23 DIAGNOSIS — K859 Acute pancreatitis, unspecified: Secondary | ICD-10-CM | POA: Diagnosis not present

## 2015-01-23 DIAGNOSIS — R1013 Epigastric pain: Secondary | ICD-10-CM | POA: Diagnosis not present

## 2015-01-23 MED ORDER — OMEPRAZOLE 20 MG PO CPDR: 20.0000 mg | DELAYED_RELEASE_CAPSULE | Freq: Every day | ORAL | Status: DC

## 2015-01-23 NOTE — Patient Instructions (Addendum)
1. Have your labs drawn when you are able to (in the next 1-3 days) 2. Return for follow-up in 4-6 weeks for further evaluation. 3. Follow a low fat diet as below.  Acute Pancreatitis Acute pancreatitis is a disease in which the pancreas becomes suddenly inflamed. The pancreas is a large gland located behind your stomach. The pancreas produces enzymes that help digest food. The pancreas also releases the hormones glucagon and insulin that help regulate blood sugar. Damage to the pancreas occurs when the digestive enzymes from the pancreas are activated and begin attacking the pancreas before being released into the intestine. Most acute attacks last a couple of days and can cause serious complications. Some people become dehydrated and develop low blood pressure. In severe cases, bleeding into the pancreas can lead to shock and can be life-threatening. The lungs, heart, and kidneys may fail. CAUSES  Pancreatitis can happen to anyone. In some cases, the cause is unknown. Most cases are caused by:  Alcohol abuse.  Gallstones. Other less common causes are:  Certain medicines.  Exposure to certain chemicals.  Infection.  Damage caused by an accident (trauma).  Abdominal surgery. SYMPTOMS   Pain in the upper abdomen that may radiate to the back.  Tenderness and swelling of the abdomen.  Nausea and vomiting. DIAGNOSIS  Your caregiver will perform a physical exam. Blood and stool tests may be done to confirm the diagnosis. Imaging tests may also be done, such as X-rays, CT scans, or an ultrasound of the abdomen. TREATMENT  Treatment usually requires a stay in the hospital. Treatment may include:  Pain medicine.  Fluid replacement through an intravenous line (IV).  Placing a tube in the stomach to remove stomach contents and control vomiting.  Not eating for 3 or 4 days. This gives your pancreas a rest, because enzymes are not being produced that can cause further  damage.  Antibiotic medicines if your condition is caused by an infection.  Surgery of the pancreas or gallbladder. HOME CARE INSTRUCTIONS   Follow the diet advised by your caregiver. This may involve avoiding alcohol and decreasing the amount of fat in your diet.  Eat smaller, more frequent meals. This reduces the amount of digestive juices the pancreas produces.  Drink enough fluids to keep your urine clear or pale yellow.  Only take over-the-counter or prescription medicines as directed by your caregiver.  Avoid drinking alcohol if it caused your condition.  Do not smoke.  Get plenty of rest.  Check your blood sugar at home as directed by your caregiver.  Keep all follow-up appointments as directed by your caregiver. SEEK MEDICAL CARE IF:   You do not recover as quickly as expected.  You develop new or worsening symptoms.  You have persistent pain, weakness, or nausea.  You recover and then have another episode of pain. SEEK IMMEDIATE MEDICAL CARE IF:   You are unable to eat or keep fluids down.  Your pain becomes severe.  You have a fever or persistent symptoms for more than 2 to 3 days.  You have a fever and your symptoms suddenly get worse.  Your skin or the white part of your eyes turn yellow (jaundice).  You develop vomiting.  You feel dizzy, or you faint.  Your blood sugar is high (over 300 mg/dL). MAKE SURE YOU:   Understand these instructions.  Will watch your condition.  Will get help right away if you are not doing well or get worse. Document Released: 09/27/2005 Document Revised:  03/28/2012 Document Reviewed: 01/06/2012 Exit[employer] Patient Information 2015 [place], [place]. This information is not intended to replace advice given to you by your health care provider. Make sure you discuss any questions you have with your health care provider.   Low-Fat Diet for Pancreatitis or Gallbladder Conditions A low-fat diet can be helpful if you have  pancreatitis or a gallbladder condition. With these conditions, your pancreas and gallbladder have trouble digesting fats. A healthy eating plan with less fat will help rest your pancreas and gallbladder and reduce your symptoms. WHAT DO I NEED TO KNOW ABOUT THIS DIET?  Eat a low-fat diet.  Reduce your fat intake to less than 20-30% of your total daily calories. This is less than 50-60 g of fat per day.  Remember that you need some fat in your diet. Ask your dietician what your daily goal should be.  Choose nonfat and low-fat healthy foods. Look for the words "nonfat," "low fat," or "fat free."  As a guide, look on the label and choose foods with less than 3 g of fat per serving. Eat only one serving.  Avoid alcohol.  Do not smoke. If you need help quitting, talk with your health care provider.  Eat small frequent meals instead of three large heavy meals. WHAT FOODS CAN I EAT? Grains Include healthy grains and starches such as potatoes, wheat bread, fiber-rich cereal, and brown rice. Choose whole grain options whenever possible. In adults, whole grains should account for 45-65% of your daily calories.  Fruits and Vegetables Eat plenty of fruits and vegetables. Fresh fruits and vegetables add fiber to your diet. Meats and Other Protein Sources Eat lean meat such as chicken and pork. Trim any fat off of meat before cooking it. Eggs, fish, and beans are other sources of protein. In adults, these foods should account for 10-35% of your daily calories. Dairy Choose low-fat milk and dairy options. Dairy includes fat and protein, as well as calcium.  Fats and Oils Limit high-fat foods such as fried foods, sweets, baked goods, sugary drinks.  Other Creamy sauces and condiments, such as mayonnaise, can add extra fat. Think about whether or not you need to use them, or use smaller amounts or low fat options. WHAT FOODS ARE NOT RECOMMENDED?  High fat foods, such as:  [employer].  Ice  cream.  [place] toast.  Sweet rolls.  Pizza.  Cheese bread.  Foods covered with batter, butter, creamy sauces, or cheese.  Fried foods.  Sugary drinks and desserts.  Foods that cause gas or bloating Document Released: 10/02/2013 Document Reviewed: 10/02/2013 [facility] Patient Information 2015 [place], [place]. This information is not intended to replace advice given to you by your health care provider. Make sure you discuss any questions you have with your health care provider.

## 2015-01-23 NOTE — Assessment & Plan Note (Signed)
81 year old female evaluated for hospital follow up for acute pancreatitis. No etiology attributed to the pancreatitis during her hospitalization. Her lipid panel was within normal limits, denies history of alcohol use. On abdominal imaging completed during her hospitalization was an abdominal ultrasound which was negative. Today she states her symptoms are worse much improved, still has some residual abdominal pain in the evenings although this could be possibly GERD symptoms. She had a lipase drawn at her PCP who told her it was elevated, however these records are not available to me today and I have requested them. Last colonoscopy in 2009, records requested.  Today I'll check a CBC, lipase, and CMP to evaluate her levels. We'll have her return to our office in 4-6 weeks to further evaluate her symptoms and her progress. Can consider MRI/MRCP at that time to rule out carcinoma or polyp as a possible etiology.

## 2015-01-23 NOTE — Progress Notes (Signed)
Primary Care Physician:  [name], NP Primary Gastroenterologist:  Dr. Gala Romney  Chief Complaint  Patient presents with  . Follow-up    HPI:   81 year old female presents for post hospital physician follow-up for admission pancreatitis. The patient was admitted on 01/08/2015 and was discharged on 01/10/2015. The previously been diagnosed with pancreatitis in February 2016, however she did not require hospitalization at that point. With this most recent admission the patient presented to the emergency room complaining of severe abdominal pain. She denies EtOH, and her lipid panel was within normal limits. Abdominal ultrasound was unremarkable during admission. She was given IV fluids and bowel rest and her lipase subsequently decreased from 812 on admission down to 327 the day of discharge. I pointed discharge patient was tolerating clear liquids reporting much less pain. Zocor was discontinued at discharge and referrals made to outpatient [facility] urology for continued evaluation. No known recent abdominal imaging besides the ultrasound completed during her hospitalization. Last colonoscopy was in 2009, states she was told to repeat in 8-9 years. It was done in [place], but would like to repeat here. Will request those records.  Today she states she saw her PCP who drew her Lipase and told her it was high, results have been requested from their office today. Is having some nighttime abdominal pain 6/10. States "it isn't anything I can't stand, but it's annoying." Pain is described as a combination of sharp/cramping/burning. Has never had an endoscopy. Denies N/V, diarrhea. Having a bowel movement daily which is consistent with a Bristol Scale 2. Denies hematochezia or melena. Also complains of dyspepsia like symptoms includein epigastric pain, bitter/sour taste in her mouth (especially upon waking in the morning) and some minor esophageal burning. Denies any other upper or lower GI  symptoms.   Past Medical History  Diagnosis Date  . Hypertension   . High cholesterol   . Bronchitis   . Pancreatitis     Past Surgical History  Procedure Laterality Date  . Cardiac catheterization  2006    Current Outpatient Prescriptions  Medication Sig Dispense Refill  . acetaminophen (TYLENOL) 500 MG tablet Take 500 mg by mouth every 6 (six) hours as needed for mild pain or moderate pain.    Marland Kitchen aspirin EC 81 MG tablet Take 81 mg by mouth every morning.    . Cholecalciferol (VITAMIN D) 2000 UNITS CAPS Take 1 capsule by mouth daily.    Marland Kitchen losartan (COZAAR) 50 MG tablet Take 50 mg by mouth daily.    . metoprolol succinate (TOPROL-XL) 25 MG 24 hr tablet Take 25 mg by mouth daily.      No current facility-administered medications for this visit.    Allergies as of 01/23/2015 - Review Complete 01/23/2015  Allergen Reaction Noted  . Codeine Itching 03/26/2012  . Penicillins Other (See Comments) 03/26/2012    Family History  Problem Relation Age of Onset  . Stroke Mother   . Stroke Father     History   Social History  . Marital Status: Single    Spouse Name: N/A  . Number of Children: N/A  . Years of Education: N/A   Occupational History  . Not on file.   Social History Main Topics  . Smoking status: Never Smoker   . Smokeless tobacco: Never Used  . Alcohol Use: No  . Drug Use: No  . Sexual Activity: Not Currently   Other Topics Concern  . Not on file   Social History Narrative  Review of Systems: General: Negative for anorexia, weight loss, fever, chills, fatigue, weakness. Eyes: Negative for vision changes.  ENT: Negative for difficulty swallowing. CV: Negative for chest pain, angina, palpitations, peripheral edema.  Respiratory: Negative for dyspnea at rest, cough, wheezing.  GI: See history of present illness. MS: Negative for joint pain, low back pain.  Derm: Negative for rash or itching.  Neuro: Negative for weakness, seizure, memory loss,  confusion.  Psych: Negative for anxiety, depression.  Endo: Negative for unusual weight change.  Heme: Negative for bruising or bleeding. Allergy: Negative for rash or hives.   Physical Exam: BP 155/80 mmHg  Pulse 67  Temp(Src) 97.9 F (36.6 C)  Ht 5\' 4"  (1.626 m)  Wt 135 lb (61.236 kg)  BMI 23.16 kg/m2 General:   Alert and oriented. Pleasant and cooperative. Well-nourished and well-developed.  Head:  Normocephalic and atraumatic. Eyes:  Without icterus, sclera clear and conjunctiva pink.  Ears:  Normal auditory acuity. Mouth:  No deformity or lesions, oral mucosa pink. No OP edema. Neck:  Supple, without mass or thyromegaly. Lungs:  Clear to auscultation bilaterally. No wheezes, rales, or rhonchi. No distress.  Heart:  S1, S2 present without murmurs appreciated.  Abdomen:  +BS, soft, and non-distended. Mild epigastric TTP. No HSM noted. No guarding or rebound. No masses appreciated.  Rectal:  Deferred  Msk:  Symmetrical without gross deformities. Normal posture. Pulses:  Normal DP pulses noted. Extremities:  Without clubbing or edema. Neurologic:  Alert and  oriented x4;  grossly normal neurologically. Skin:  Intact without significant lesions or rashes. Cervical Nodes:  No significant cervical adenopathy. Psych:  Alert and cooperative. Normal mood and affect.     01/23/2015 1:47 PM

## 2015-01-23 NOTE — Assessment & Plan Note (Signed)
Patient describes symptoms consistent with dyspepsia including epigastric pain, bitter sour taste in her mouth especially when waking first thing in the morning. Possible that she has some underlying acid reflux symptoms which could be responsible at least in part for her posthospitalization symptomatology. We'll trial her on omeprazole 20 mg once a day for symptomatically improvement. Plan for routine follow-up in 4-6 weeks to further evaluate her symptoms, her lab results, and consider MRI/MRCP (see acute pancreatitis assessment and plan for more details).

## 2015-01-24 LAB — CBC WITH DIFFERENTIAL/PLATELET
BASOS ABS: 0 10*3/uL (ref 0.0–0.1)
Basophils Relative: 0 % (ref 0–1)
EOS PCT: 1 % (ref 0–5)
Eosinophils Absolute: 0.1 10*3/uL (ref 0.0–0.7)
HCT: 38.9 % (ref 36.0–46.0)
Hemoglobin: 12.1 g/dL (ref 12.0–15.0)
LYMPHS ABS: 2.7 10*3/uL (ref 0.7–4.0)
Lymphocytes Relative: 31 % (ref 12–46)
MCH: 28.3 pg (ref 26.0–34.0)
MCHC: 31.1 g/dL (ref 30.0–36.0)
MCV: 91.1 fL (ref 78.0–100.0)
MPV: 11.6 fL (ref 8.6–12.4)
Monocytes Absolute: 0.8 10*3/uL (ref 0.1–1.0)
Monocytes Relative: 9 % (ref 3–12)
Neutro Abs: 5.1 10*3/uL (ref 1.7–7.7)
Neutrophils Relative %: 59 % (ref 43–77)
PLATELETS: 281 10*3/uL (ref 150–400)
RBC: 4.27 MIL/uL (ref 3.87–5.11)
RDW: 14.3 % (ref 11.5–15.5)
WBC: 8.7 10*3/uL (ref 4.0–10.5)

## 2015-01-24 LAB — COMPREHENSIVE METABOLIC PANEL
ALT: 13 U/L (ref 0–35)
AST: 19 U/L (ref 0–37)
Albumin: 4.1 g/dL (ref 3.5–5.2)
Alkaline Phosphatase: 80 U/L (ref 39–117)
BUN: 17 mg/dL (ref 6–23)
CALCIUM: 9.9 mg/dL (ref 8.4–10.5)
CHLORIDE: 103 meq/L (ref 96–112)
CO2: 27 mEq/L (ref 19–32)
Creat: 0.95 mg/dL (ref 0.50–1.10)
Glucose, Bld: 105 mg/dL — ABNORMAL HIGH (ref 70–99)
Potassium: 4.4 mEq/L (ref 3.5–5.3)
Sodium: 140 mEq/L (ref 135–145)
Total Bilirubin: 0.5 mg/dL (ref 0.2–1.2)
Total Protein: 7.2 g/dL (ref 6.0–8.3)

## 2015-01-24 LAB — LIPASE: Lipase: 1011 U/L — ABNORMAL HIGH (ref 0–75)

## 2015-01-27 ENCOUNTER — Other Ambulatory Visit (HOSPITAL_COMMUNITY): Payer: Self-pay | Admitting: Nurse Practitioner

## 2015-01-27 DIAGNOSIS — Z1231 Encounter for screening mammogram for malignant neoplasm of breast: Secondary | ICD-10-CM

## 2015-01-29 ENCOUNTER — Ambulatory Visit: Payer: Medicare Other | Admitting: Nurse Practitioner

## 2015-01-29 ENCOUNTER — Ambulatory Visit (HOSPITAL_COMMUNITY)
Admission: RE | Admit: 2015-01-29 | Discharge: 2015-01-29 | Disposition: A | Discharge status: Home / Self Care | Payer: Medicare Other | Source: Ambulatory Visit | Attending: Nurse Practitioner | Admitting: Nurse Practitioner

## 2015-01-29 DIAGNOSIS — Z1231 Encounter for screening mammogram for malignant neoplasm of breast: Secondary | ICD-10-CM | POA: Insufficient documentation

## 2015-01-30 ENCOUNTER — Telehealth: Payer: Self-pay

## 2015-01-30 NOTE — Telephone Encounter (Signed)
Pt called wanting to know about her lab results 

## 2015-01-31 ENCOUNTER — Telehealth: Payer: Self-pay | Admitting: Nurse Practitioner

## 2015-01-31 NOTE — Telephone Encounter (Signed)
See result note.  

## 2015-01-31 NOTE — Telephone Encounter (Signed)
In addition to relaying results, please ask the patient how she's doing. If her symptoms worsen or become severe she needs to be seen here or in the ER. Please advise her of this.

## 2015-02-03 ENCOUNTER — Other Ambulatory Visit: Payer: Self-pay

## 2015-02-03 ENCOUNTER — Other Ambulatory Visit: Payer: Self-pay | Admitting: Nurse Practitioner

## 2015-02-03 DIAGNOSIS — R928 Other abnormal and inconclusive findings on diagnostic imaging of breast: Secondary | ICD-10-CM

## 2015-02-03 DIAGNOSIS — R109 Unspecified abdominal pain: Secondary | ICD-10-CM

## 2015-02-03 NOTE — Telephone Encounter (Signed)
Noted.  See result note.  

## 2015-02-03 NOTE — Telephone Encounter (Signed)
Spoke with pt this am. States she is having just a little pain.

## 2015-02-03 NOTE — Telephone Encounter (Signed)
Routing to [employer]. See previous phone note

## 2015-02-10 ENCOUNTER — Ambulatory Visit (HOSPITAL_COMMUNITY)
Admission: RE | Admit: 2015-02-10 | Discharge: 2015-02-10 | Disposition: A | Discharge status: Home / Self Care | Payer: Medicare Other | Source: Ambulatory Visit | Attending: Nurse Practitioner | Admitting: Nurse Practitioner

## 2015-02-10 ENCOUNTER — Other Ambulatory Visit: Payer: Self-pay | Admitting: Nurse Practitioner

## 2015-02-10 DIAGNOSIS — R1010 Upper abdominal pain, unspecified: Secondary | ICD-10-CM | POA: Insufficient documentation

## 2015-02-10 DIAGNOSIS — R109 Unspecified abdominal pain: Secondary | ICD-10-CM

## 2015-02-10 DIAGNOSIS — K868 Other specified diseases of pancreas: Secondary | ICD-10-CM | POA: Diagnosis not present

## 2015-02-10 MED ORDER — GADOBENATE DIMEGLUMINE 529 MG/ML IV SOLN
12.0000 mL | Freq: Once | INTRAVENOUS | Status: AC | PRN
  Administered 2015-02-10: 12 mL via INTRAVENOUS

## 2015-02-13 ENCOUNTER — Ambulatory Visit
Admission: RE | Admit: 2015-02-13 | Discharge: 2015-02-13 | Disposition: A | Discharge status: Home / Self Care | Payer: Medicare Other | Source: Ambulatory Visit | Attending: Nurse Practitioner | Admitting: Nurse Practitioner

## 2015-02-13 ENCOUNTER — Other Ambulatory Visit: Payer: Self-pay | Admitting: Nurse Practitioner

## 2015-02-13 DIAGNOSIS — R928 Other abnormal and inconclusive findings on diagnostic imaging of breast: Secondary | ICD-10-CM

## 2015-02-25 ENCOUNTER — Encounter: Payer: Self-pay | Admitting: Nurse Practitioner

## 2015-02-25 ENCOUNTER — Ambulatory Visit (INDEPENDENT_AMBULATORY_CARE_PROVIDER_SITE_OTHER): Payer: Medicare Other | Admitting: Nurse Practitioner

## 2015-02-25 VITALS — BP 156/81 | HR 69 | Temp 98.1°F | Ht 64.0 in | Wt 129.0 lb

## 2015-02-25 DIAGNOSIS — R748 Abnormal levels of other serum enzymes: Secondary | ICD-10-CM | POA: Insufficient documentation

## 2015-02-25 DIAGNOSIS — R1084 Generalized abdominal pain: Secondary | ICD-10-CM

## 2015-02-25 DIAGNOSIS — R634 Abnormal weight loss: Secondary | ICD-10-CM

## 2015-02-25 DIAGNOSIS — R933 Abnormal findings on diagnostic imaging of other parts of digestive tract: Secondary | ICD-10-CM | POA: Insufficient documentation

## 2015-02-25 DIAGNOSIS — R109 Unspecified abdominal pain: Secondary | ICD-10-CM | POA: Insufficient documentation

## 2015-02-25 NOTE — Patient Instructions (Signed)
1. We have sent a request to [facility] for further testing. 2. We will contact he will meter back from them regarding which specific testing needs to be performed. 3. Somebody from their office should call you to schedule the appointments. 4. Return here for routine follow-up in 3 months. If you have worsening symptoms or recurrence of severe abdominal pain call us sooner.

## 2015-02-25 NOTE — Progress Notes (Signed)
Referring Provider: [name], NP Primary Care Physician:  [name], NP Primary GI:  Dr. Gala Romney  Chief Complaint  Patient presents with  . Pancreatitis    HPI:   53 her female presents for routine follow-up on chronic pancreatitis. She has chronically elevated lipase, last elevation was over 1000. Patient was mildly symptomatically that point. Follow-up MRI/MRCP to evaluate found dilated pancreatic duct, side branches, common bile duct. No evidence of overt mass or stones noted. Patient to be referred for further evaluation in [place]. Message sent to physician at [facility] gastroenterology for guidance on which would be most appropriate: ERCP versus EUS.  Today she states her abdominal pain is improved. Deneis N/V. Has had to avoid high fat foods as this tends to cause rebound in abdominal pain. States overall she feels much better. Has noted subjective weight loss. Objectively, she weighed 142 lb in 2014, 139 lb 01/08/15, and today she is down to 129 lb for a loss of 10 pounds in 1.5 months and 13 pounds in general. Denies chest pain, dyspnea, dizziness, fever, chills, syncope, near syncope. Denies additional upper or lower GI symptoms.  Approximately 30 mins was spent with this patient with >50% spent on counseling and care coordination as well as education on findings and necessary further workup.   Past Medical History  Diagnosis Date  . Hypertension   . High cholesterol   . Bronchitis   . Pancreatitis     Past Surgical History  Procedure Laterality Date  . Cardiac catheterization  2006  . Colonoscopy  2009    Done in [place], recc repeat 8-9 years (per patient)    Current Outpatient Prescriptions  Medication Sig Dispense Refill  . acetaminophen (TYLENOL) 500 MG tablet Take 500 mg by mouth every 6 (six) hours as needed for mild pain or moderate pain.    Marland Kitchen aspirin EC 81 MG tablet Take 81 mg by mouth every morning.    . Cholecalciferol (VITAMIN D)  2000 UNITS CAPS Take 1 capsule by mouth daily.    Marland Kitchen losartan (COZAAR) 50 MG tablet Take 50 mg by mouth daily.    . metoprolol succinate (TOPROL-XL) 25 MG 24 hr tablet Take 25 mg by mouth daily.      No current facility-administered medications for this visit.    Allergies as of 02/25/2015 - Review Complete 02/25/2015  Allergen Reaction Noted  . Codeine Itching 03/26/2012  . Omeprazole Nausea And Vomiting 02/25/2015  . Penicillins Other (See Comments) 03/26/2012    Family History  Problem Relation Age of Onset  . Stroke Mother   . Stroke Father   . Colon cancer Neg Hx   . Pancreatitis Neg Hx     History   Social History  . Marital Status: Single    Spouse Name: N/A  . Number of Children: N/A  . Years of Education: N/A   Social History Main Topics  . Smoking status: Never Smoker   . Smokeless tobacco: Never Used  . Alcohol Use: No  . Drug Use: No  . Sexual Activity: Not Currently   Other Topics Concern  . None   Social History Narrative    Review of Systems: General: Negative for anorexia, fever, chills, fatigue, weakness. Eyes: Negative for vision changes.  ENT: Negative for hoarseness, difficulty swallowing. CV: Negative for chest pain, angina, palpitations, peripheral edema.  Respiratory: Negative for dyspnea at rest, cough, sputum, wheezing.  GI: See history of present illness. Derm: Negative for rash or  itching.  Neuro: Negative for weakness, memory loss, confusion.  Psych: Negative for anxiety, depression.   Heme: Negative for bruising or bleeding. Allergy: Negative for rash or hives.   Physical Exam: BP 156/81 mmHg  Pulse 69  Temp(Src) 98.1 F (36.7 C) (Oral)  Ht 5\' 4"  (1.626 m)  Wt 129 lb (58.514 kg)  BMI 22.13 kg/m2 General:   Alert and oriented. No distress noted. Pleasant and cooperative.  Head:  Normocephalic and atraumatic. Eyes:  Conjuctiva clear without scleral icterus. Neck:  Supple, without mass or thyromegaly. Lungs:  Clear to  auscultation bilaterally. No wheezes, rales, or rhonchi. No distress.  Heart:  S1, S2 present without murmurs, rubs, or gallops. Regular rate and rhythm. Abdomen:  +BS, soft, non-tender and non-distended. No rebound or guarding. No HSM or masses noted. Pulses:  2+ DP noted bilaterally Extremities:  Without edema. Neurologic:  Alert and  oriented x4;  grossly normal neurologically. Skin:  Intact without significant lesions or rashes. Psych:  Alert and cooperative. Normal mood and affect.    02/25/2015 2:11 PM

## 2015-02-26 ENCOUNTER — Telehealth: Payer: Self-pay

## 2015-02-26 DIAGNOSIS — Q453 Other congenital malformations of pancreas and pancreatic duct: Secondary | ICD-10-CM

## 2015-02-26 NOTE — Telephone Encounter (Signed)
-----   Message from Milus Banister, MD sent at 02/26/2015  4:26 PM EDT ----- Randall Hiss, Since LFTs have been normal, I think EUS will probably be a better next step than ERCP.  I'm happy to get that arranged here in [place] if the patient agrees.    Adrienne Mocha, FYI, see above.  Will await word from Randall Hiss about Ms. Letourneau, EUS.  Thanks   ----- Message -----    From: Carlis Stable, NP    Sent: 02/25/2015   1:22 PM      To: Milus Banister, MD  Good Afternoon Dr. Ardis Hughs,     I'm with Marylee Floras and have this patient with persistent abdominal pain and persistently elevated lipase. MRI/MRCP showed dilation of her pancreatic duct and side branches as well as CBD without obvious mass or stones. I need to refer her for further workup, but am not sure which would be more helpful at this point: ERCP vs EUS. Laban Emperor (my coworker) recommended I reach out to you for guidance.  Any help is much appreciated.  Thanks! Walden Field, NP

## 2015-02-27 ENCOUNTER — Other Ambulatory Visit: Payer: Self-pay

## 2015-02-27 DIAGNOSIS — Q453 Other congenital malformations of pancreas and pancreatic duct: Secondary | ICD-10-CM

## 2015-02-27 NOTE — Telephone Encounter (Signed)
EUS scheduled, pt instructed and medications reviewed.  Patient instructions mailed to home.  Patient to call with any questions or concerns.  

## 2015-02-27 NOTE — Telephone Encounter (Signed)
Thanks Alinda Money take care of the scheduling, I'll let you know what I find.   Adrienne Mocha, Ok to contact her about EUS and schedule (upper eus, radial +/- linear, next avail MAC Thursday, for abnormal pancreatic duct) Thanks        Previous Messages     ----- Message -----   From: Carlis Stable, NP   Sent: 02/27/2015  8:15 AM    To: Milus Banister, MD, Barron Alvine, CMA   Thanks so much for your help. I saw the patient yesterday and she's agreeable to next steps. I explained to her we just needed to check which was the most appropriate workup for her. I told her she'd receive a call from either our office or yours to schedule.   Thanks again!  Randall Hiss   ----- Message -----   From: Milus Banister, MD   Sent: 02/26/2015  4:26 PM    To: Barron Alvine, CMA, Carlis Stable, NP   Randall Hiss,  Since LFTs have been normal, I think EUS will probably be a better next step than ERCP. I'm happy to get that arranged here in [place] if the patient agrees.    Adrienne Mocha,  FYI, see above. Will await word from Randall Hiss about Ms. Wickersham, EUS.   Thanks    ----- Message -----   From: Carlis Stable, NP   Sent: 02/25/2015  1:22 PM    To: Milus Banister, MD   Good Afternoon Dr. Ardis Hughs,    I'm with Marylee Floras and have this patient with persistent abdominal pain and persistently elevated lipase. MRI/MRCP showed dilation of her pancreatic duct and side branches as well as CBD without obvious mass or stones. I need to refer her for further workup, but am not sure which would be more helpful at this point: ERCP vs EUS. Laban Emperor (my coworker) recommended I reach out to you for guidance. Any help is much appreciated.   Thanks!  Walden Field, NP

## 2015-02-28 ENCOUNTER — Ambulatory Visit
Admission: RE | Admit: 2015-02-28 | Discharge: 2015-02-28 | Disposition: A | Discharge status: Home / Self Care | Payer: Medicare Other | Source: Ambulatory Visit | Attending: Nurse Practitioner | Admitting: Nurse Practitioner

## 2015-02-28 DIAGNOSIS — R928 Other abnormal and inconclusive findings on diagnostic imaging of breast: Secondary | ICD-10-CM

## 2015-03-03 ENCOUNTER — Other Ambulatory Visit (HOSPITAL_COMMUNITY): Payer: Self-pay | Admitting: Nurse Practitioner

## 2015-03-03 DIAGNOSIS — R0989 Other specified symptoms and signs involving the circulatory and respiratory systems: Secondary | ICD-10-CM

## 2015-03-04 ENCOUNTER — Encounter (HOSPITAL_COMMUNITY): Payer: Self-pay | Admitting: *Deleted

## 2015-03-06 ENCOUNTER — Ambulatory Visit (HOSPITAL_COMMUNITY)
Admission: RE | Admit: 2015-03-06 | Discharge: 2015-03-06 | Disposition: A | Discharge status: Home / Self Care | Payer: Medicare Other | Source: Ambulatory Visit | Attending: Nurse Practitioner | Admitting: Nurse Practitioner

## 2015-03-06 DIAGNOSIS — R0989 Other specified symptoms and signs involving the circulatory and respiratory systems: Secondary | ICD-10-CM | POA: Insufficient documentation

## 2015-03-06 NOTE — Assessment & Plan Note (Signed)
Patient with persistent elevated lipase. Completed MRI/MRCP which showed dilated pancreatic duct, side branches, and CBD but no evidence of overt mass or stones. She was having abdominal pain but this is vastly improved. Denies nausea and vomiting. States she does occasionally have rebound abdominal pain when she eats high-fat foods but is felt "overall much better." She has noted a subjective weight loss, and this confirmed by an objective weight loss of 10 pounds in the past 1-1/2 months and 13 pounds overall. No other GI symptoms. Consulted with [facility] gastroenterologist for consideration of EUS versus ERCP. Dr. Ardis Hughs feels EUS is more appropriate given normal LFTs, compared to ERCP. They've agreed to arrange for scheduling and performing this procedure and notify us of the results. I will have her return for further evaluation in 3 months and to review results and any appropriate next steps.

## 2015-03-06 NOTE — Progress Notes (Signed)
cc'ed to pcp °

## 2015-03-11 ENCOUNTER — Telehealth: Payer: Self-pay | Admitting: Gastroenterology

## 2015-03-12 ENCOUNTER — Other Ambulatory Visit (HOSPITAL_COMMUNITY): Payer: Self-pay | Admitting: Nurse Practitioner

## 2015-03-12 ENCOUNTER — Encounter (HOSPITAL_COMMUNITY): Payer: Self-pay | Admitting: Anesthesiology

## 2015-03-12 DIAGNOSIS — R9389 Abnormal findings on diagnostic imaging of other specified body structures: Secondary | ICD-10-CM

## 2015-03-12 NOTE — Telephone Encounter (Signed)
Left message on machine to call back  

## 2015-03-12 NOTE — Telephone Encounter (Signed)
OK, thanks

## 2015-03-12 NOTE — Telephone Encounter (Signed)
Pt needs to schedule CT for Carotid blockage and will call back when she is ready to schedule

## 2015-03-13 ENCOUNTER — Ambulatory Visit (HOSPITAL_COMMUNITY): Admission: RE | Admit: 2015-03-13 | Payer: Medicare Other | Source: Ambulatory Visit | Admitting: Gastroenterology

## 2015-03-13 ENCOUNTER — Encounter (HOSPITAL_COMMUNITY): Admission: RE | Payer: Self-pay | Source: Ambulatory Visit

## 2015-03-13 HISTORY — DX: Unspecified benign mammary dysplasia of right breast: N60.91

## 2015-03-13 SURGERY — UPPER ENDOSCOPIC ULTRASOUND (EUS) LINEAR
Anesthesia: Monitor Anesthesia Care

## 2015-03-17 ENCOUNTER — Ambulatory Visit (HOSPITAL_COMMUNITY)
Admission: RE | Admit: 2015-03-17 | Discharge: 2015-03-17 | Disposition: A | Discharge status: Home / Self Care | Payer: Medicare Other | Source: Ambulatory Visit | Attending: Nurse Practitioner | Admitting: Nurse Practitioner

## 2015-03-17 DIAGNOSIS — R938 Abnormal findings on diagnostic imaging of other specified body structures: Secondary | ICD-10-CM | POA: Diagnosis present

## 2015-03-17 DIAGNOSIS — R9389 Abnormal findings on diagnostic imaging of other specified body structures: Secondary | ICD-10-CM

## 2015-03-17 DIAGNOSIS — I6523 Occlusion and stenosis of bilateral carotid arteries: Secondary | ICD-10-CM | POA: Insufficient documentation

## 2015-03-17 MED ORDER — IOHEXOL 350 MG/ML SOLN
100.0000 mL | Freq: Once | INTRAVENOUS | Status: AC | PRN
  Administered 2015-03-17: 100 mL via INTRAVENOUS

## 2015-03-20 ENCOUNTER — Ambulatory Visit: Payer: Medicare Other | Admitting: Cardiology

## 2015-03-24 ENCOUNTER — Encounter: Payer: Self-pay | Admitting: Cardiology

## 2015-03-24 ENCOUNTER — Ambulatory Visit (INDEPENDENT_AMBULATORY_CARE_PROVIDER_SITE_OTHER): Payer: Medicare Other | Admitting: Cardiology

## 2015-03-24 VITALS — BP 151/76 | HR 60 | Ht 64.0 in | Wt 126.8 lb

## 2015-03-24 DIAGNOSIS — I10 Essential (primary) hypertension: Secondary | ICD-10-CM

## 2015-03-24 DIAGNOSIS — E785 Hyperlipidemia, unspecified: Secondary | ICD-10-CM

## 2015-03-24 DIAGNOSIS — I6522 Occlusion and stenosis of left carotid artery: Secondary | ICD-10-CM | POA: Diagnosis not present

## 2015-03-24 NOTE — Patient Instructions (Signed)
Your physician recommends that you continue on your current medications as directed. Please refer to the Current Medication list given to you today. Your physician has requested that you have a carotid duplex in 1 year just before your next visit. This test is an ultrasound of the carotid arteries in your neck. It looks at blood flow through these arteries that supply the brain with blood. Allow one hour for this exam. There are no restrictions or special instructions. Your physician recommends that you schedule a follow-up appointment in: 1 year. You will receive a reminder letter in the mail in about 10 months reminding you to call and schedule your appointment. If you don't receive this letter, please contact our office.

## 2015-03-24 NOTE — Progress Notes (Signed)
Cardiology Office Note  Date: 03/24/2015   ID: Veronica Stevenson, DOB November 17, 1932, MRN [id number]  PCP: [name], NP  Primary Cardiologist: Rozann Lesches, MD   Chief Complaint  Patient presents with  . Carotid artery disease    History of Present Illness: Veronica Stevenson is a 80 y.o. female referred for cardiology consultation by Ms. Strader NP. I reviewed her records. She was recently referred for carotid Dopplers in follow-up of bruits. This was followed by a CT angiogram of the neck to further clarify her anatomy. Results are outlined below. In summary, she was not found to have any significant internal carotid artery stenoses, but had a 90% left external carotid stenosis.   She reports no history of stroke, no neurological symptoms or changes in vision. She reports no angina or increasing shortness of breath with regular activities. Main complaint is of weight loss, she is undergoing a gastrointestinal evaluation, possibly to pursue an endoscopy. She does have a history of chronic pancreatitis.  I discussed the results of her carotid studies. Since this is an external carotid artery stenosis, and she has no significant obstructive internal carotid artery disease on either side, there is no indication for any specific intervention other than medical therapy for atherosclerosis. Carotid bruit on the left would be consistent with these findings.   Past Medical History  Diagnosis Date  . Essential hypertension   . Hyperlipidemia   . Asthmatic bronchitis   . Chronic pancreatitis   . Osteoarthritis of knee   . Atypical ductal hyperplasia of right breast   . Vitamin D deficiency   . GERD (gastroesophageal reflux disease)   . Hyperparathyroidism   . Zollinger-Ellison syndrome   . Carotid artery disease     Past Surgical History  Procedure Laterality Date  . Colonoscopy  2009    [place]  . Tonsillectomy    . Cataract extraction, bilateral Bilateral   .  Parathyroidectomy    . Ovarian cyst removal      Current Outpatient Prescriptions  Medication Sig Dispense Refill  . acetaminophen (TYLENOL) 500 MG tablet Take 500 mg by mouth every 6 (six) hours as needed for mild pain or moderate pain.    Marland Kitchen aspirin EC 81 MG tablet Take 81 mg by mouth every morning.    Marland Kitchen atorvastatin (LIPITOR) 10 MG tablet Take 10 mg by mouth daily.    . Cholecalciferol (VITAMIN D) 2000 UNITS CAPS Take 1 capsule by mouth daily.    Marland Kitchen losartan (COZAAR) 50 MG tablet Take 50 mg by mouth every morning.     . metFORMIN (GLUCOPHAGE) 500 MG tablet Take 500 mg by mouth daily.    . metoprolol succinate (TOPROL-XL) 25 MG 24 hr tablet Take 25 mg by mouth every evening.      No current facility-administered medications for this visit.    Allergies:  Codeine; Omeprazole; and Penicillins   Social History: The patient  reports that she has never smoked. She has never used smokeless tobacco. She reports that she does not drink alcohol or use illicit drugs.   Family History: The patient's family history includes Breast cancer in her sister; CAD in her mother; Stroke in her brother and father.   ROS:  Please see the history of present illness. Otherwise, complete review of systems is positive for weight loss.  All other systems are reviewed and negative.   Physical Exam: VS:  BP 151/76 mmHg  Pulse 60  Ht 5\' 4"  (1.626 m)  Wt 126 lb 12.8 oz (57.516 kg)  BMI 21.75 kg/m2  SpO2 97%, BMI Body mass index is 21.75 kg/(m^2).  Wt Readings from Last 3 Encounters:  03/24/15 126 lb 12.8 oz (57.516 kg)  02/25/15 129 lb (58.514 kg)  01/23/15 135 lb (61.236 kg)     General: Patient appears comfortable at rest. HEENT: Conjunctiva and lids normal, oropharynx clear. Neck: Supple, no elevated JVP, left carotid bruit, no thyromegaly. Lungs: Clear to auscultation, nonlabored breathing at rest. Cardiac: Regular rate and rhythm, no S3 or significant systolic murmur, no pericardial rub. Abdomen:  Soft, nontender, bowel sounds present, no guarding or rebound. Extremities: No pitting edema, distal pulses 2+. Skin: Warm and dry. Musculoskeletal: No kyphosis. Neuropsychiatric: Alert and oriented x3, affect grossly appropriate.   ECG: Tracing from 01/08/2015 showed normal sinus rhythm, rule out right atrial enlargement.   Recent Labwork: 01/23/2015: ALT 13; AST 19; BUN 17; Creat 0.95; Hemoglobin 12.1; Platelets 281; Potassium 4.4; Sodium 140     Component Value Date/Time   CHOL 110 01/09/2015 0555   TRIG 57 01/09/2015 0555   HDL 50 01/09/2015 0555   CHOLHDL 2.2 01/09/2015 0555   VLDL 11 01/09/2015 0555   LDLCALC 49 01/09/2015 0555  02/19/2015: Hemoglobin 12.5, platelets 224, cholesterol 171, triglycerides 92, HDL 56, LDL 97, hemoglobin A1c 7.1, BUN 14, creatinine 0.9, potassium 4.5, AST 24, ALT 20  Other Studies Reviewed Today:  1. Carotid Dopplers 03/06/2015: IMPRESSION: Minimal plaque formation is noted in the proximal right internal carotid artery consistent with less than 50% diameter stenosis based on ultrasound and Doppler criteria.  Moderate irregular plaque formation is noted in the proximal lef internal carotid artery based on ultrasound imaging, although no significant elevated velocities are noted currently on Doppler examination. However, significantly elevated velocities were noted on the prior exam of 2014. Given the incongruity between ultrasound and Doppler findings, as well as difference compared with prior exam of 2014, CT angiography is recommended for further evaluation of possibly significant left internal carotid artery stenosis.  2. CT angiogram of the neck 03/17/2015: IMPRESSION: Right carotid bifurcation appears normal.  Some atherosclerotic disease at the left carotid bifurcation. No stenosis of the internal carotid artery. Small ulcerations/irregularities at the bifurcation and the proximal bulb. Severe stenosis of the external carotid artery,  90% with luminal diameter of 1 mm or less at the origin.   Assessment and Plan:  1. Asymptomatic, 90% left external carotid artery stenosis, with no significant obstructive internal carotid artery disease bilaterally. No indication for further intervention other than medical therapy directed at atherosclerosis. She is already on an aspirin and statin. We will arrange a follow-up carotid duplex scan for one year.  2. Essential hypertension, currently on Cozaar and Toprol-XL. Keep regular follow-up with primary care provider.  3. Hyperlipidemia, currently on Lipitor.  Current medicines were reviewed with the patient today.  Disposition: FU with me in 1 year.   Signed, Satira Sark, MD, [facility] 03/24/2015 3:31 PM    [facility] [address], [place], [place] [address] Phone: [phone]; Fax: [phone]

## 2015-03-27 ENCOUNTER — Telehealth: Payer: Self-pay | Admitting: Gastroenterology

## 2015-03-27 ENCOUNTER — Other Ambulatory Visit: Payer: Self-pay

## 2015-03-27 DIAGNOSIS — R932 Abnormal findings on diagnostic imaging of liver and biliary tract: Secondary | ICD-10-CM

## 2015-03-27 NOTE — Telephone Encounter (Signed)
OK to schedule for upper EUS, radial +/- linear, next available EUS Thursday with MAC. Dx abnormal pancreas

## 2015-03-27 NOTE — Telephone Encounter (Signed)
Dr Ardis Hughs is ok to scheduled EUS or do you need to see her in the office first?

## 2015-03-27 NOTE — Telephone Encounter (Signed)
Pt has been scheduled for EUS 04/17/15 730 am she has been instructed and instruction have been mailed

## 2015-04-08 ENCOUNTER — Encounter (HOSPITAL_COMMUNITY): Payer: Self-pay | Admitting: *Deleted

## 2015-04-16 NOTE — Anesthesia Preprocedure Evaluation (Addendum)
Anesthesia Evaluation  Patient identified by MRN, date of birth, ID band  Reviewed: Allergy & Precautions, NPO status , Patient's Chart, lab work & pertinent test results  Airway Mallampati: I  TM Distance: >3 FB Neck ROM: Full    Dental  (+) Edentulous Upper   Pulmonary asthma ,  breath sounds clear to auscultation  Pulmonary exam normal       Cardiovascular hypertension, Pt. on medications + CAD and + Peripheral Vascular Disease Normal cardiovascular examRhythm:Regular Rate:Normal     Neuro/Psych    GI/Hepatic PUD, GERD-  Medicated,Chronic pancreatits    Endo/Other    Renal/GU      Musculoskeletal   Abdominal   Peds  Hematology  (+) anemia ,   Anesthesia Other Findings   Reproductive/Obstetrics                           Anesthesia Physical Anesthesia Plan  ASA: III  Anesthesia Plan: MAC   Post-op Pain Management:    Induction: Intravenous  Airway Management Planned: Natural Airway  Additional Equipment:   Intra-op Plan:   Post-operative Plan:   Informed Consent: I have reviewed the patients History and Physical, chart, labs and discussed the procedure including the risks, benefits and alternatives for the proposed anesthesia with the patient or authorized representative who has indicated his/her understanding and acceptance.   Dental advisory given  Plan Discussed with: CRNA  Anesthesia Plan Comments: (- Patient had carotid dopplers performed and cardiology eval 03/29/15 for carotid bruit, only lesion found was external carotid stenosis, no internal carotid stenosis, no further work up or intervention per cardiology - taking cipro currently for UTI diagnosed 1 week ago, has been under treatment since then, no issues with pain, vital signs currently, denies symptoms)       Anesthesia Quick Evaluation

## 2015-04-17 ENCOUNTER — Encounter (HOSPITAL_COMMUNITY)
Admission: RE | Disposition: A | Discharge status: Home / Self Care | Payer: Self-pay | Source: Ambulatory Visit | Attending: Gastroenterology

## 2015-04-17 ENCOUNTER — Ambulatory Visit (HOSPITAL_COMMUNITY): Payer: Medicare Other | Admitting: Anesthesiology

## 2015-04-17 ENCOUNTER — Encounter (HOSPITAL_COMMUNITY): Payer: Self-pay

## 2015-04-17 ENCOUNTER — Telehealth: Payer: Self-pay

## 2015-04-17 ENCOUNTER — Ambulatory Visit (HOSPITAL_COMMUNITY)
Admission: RE | Admit: 2015-04-17 | Discharge: 2015-04-17 | Disposition: A | Discharge status: Home / Self Care | Payer: Medicare Other | Source: Ambulatory Visit | Attending: Gastroenterology | Admitting: Gastroenterology

## 2015-04-17 DIAGNOSIS — I6522 Occlusion and stenosis of left carotid artery: Secondary | ICD-10-CM | POA: Insufficient documentation

## 2015-04-17 DIAGNOSIS — R932 Abnormal findings on diagnostic imaging of liver and biliary tract: Secondary | ICD-10-CM

## 2015-04-17 DIAGNOSIS — Z7982 Long term (current) use of aspirin: Secondary | ICD-10-CM | POA: Insufficient documentation

## 2015-04-17 DIAGNOSIS — E785 Hyperlipidemia, unspecified: Secondary | ICD-10-CM | POA: Insufficient documentation

## 2015-04-17 DIAGNOSIS — M179 Osteoarthritis of knee, unspecified: Secondary | ICD-10-CM | POA: Insufficient documentation

## 2015-04-17 DIAGNOSIS — R933 Abnormal findings on diagnostic imaging of other parts of digestive tract: Secondary | ICD-10-CM

## 2015-04-17 DIAGNOSIS — K3189 Other diseases of stomach and duodenum: Secondary | ICD-10-CM | POA: Diagnosis not present

## 2015-04-17 DIAGNOSIS — J45909 Unspecified asthma, uncomplicated: Secondary | ICD-10-CM | POA: Insufficient documentation

## 2015-04-17 DIAGNOSIS — E559 Vitamin D deficiency, unspecified: Secondary | ICD-10-CM | POA: Diagnosis not present

## 2015-04-17 DIAGNOSIS — K868 Other specified diseases of pancreas: Secondary | ICD-10-CM | POA: Diagnosis not present

## 2015-04-17 DIAGNOSIS — Q453 Other congenital malformations of pancreas and pancreatic duct: Secondary | ICD-10-CM

## 2015-04-17 DIAGNOSIS — K8689 Other specified diseases of pancreas: Secondary | ICD-10-CM

## 2015-04-17 DIAGNOSIS — I739 Peripheral vascular disease, unspecified: Secondary | ICD-10-CM | POA: Insufficient documentation

## 2015-04-17 DIAGNOSIS — R748 Abnormal levels of other serum enzymes: Secondary | ICD-10-CM

## 2015-04-17 DIAGNOSIS — R1013 Epigastric pain: Secondary | ICD-10-CM

## 2015-04-17 DIAGNOSIS — I2510 Atherosclerotic heart disease of native coronary artery without angina pectoris: Secondary | ICD-10-CM | POA: Insufficient documentation

## 2015-04-17 DIAGNOSIS — I10 Essential (primary) hypertension: Secondary | ICD-10-CM | POA: Insufficient documentation

## 2015-04-17 DIAGNOSIS — K219 Gastro-esophageal reflux disease without esophagitis: Secondary | ICD-10-CM | POA: Diagnosis not present

## 2015-04-17 DIAGNOSIS — R109 Unspecified abdominal pain: Secondary | ICD-10-CM

## 2015-04-17 DIAGNOSIS — Z79899 Other long term (current) drug therapy: Secondary | ICD-10-CM | POA: Insufficient documentation

## 2015-04-17 DIAGNOSIS — K859 Acute pancreatitis, unspecified: Secondary | ICD-10-CM

## 2015-04-17 DIAGNOSIS — R634 Abnormal weight loss: Secondary | ICD-10-CM

## 2015-04-17 HISTORY — PX: EUS: SHX5427

## 2015-04-17 LAB — CBC WITH DIFFERENTIAL/PLATELET
Basophils Absolute: 0 10*3/uL (ref 0.0–0.1)
Basophils Relative: 0 % (ref 0–1)
EOS PCT: 1 % (ref 0–5)
Eosinophils Absolute: 0 10*3/uL (ref 0.0–0.7)
HCT: 37.3 % (ref 36.0–46.0)
Hemoglobin: 12.9 g/dL (ref 12.0–15.0)
LYMPHS ABS: 1.1 10*3/uL (ref 0.7–4.0)
Lymphocytes Relative: 22 % (ref 12–46)
MCH: 28.8 pg (ref 26.0–34.0)
MCHC: 34.6 g/dL (ref 30.0–36.0)
MCV: 83.3 fL (ref 78.0–100.0)
MONO ABS: 0.4 10*3/uL (ref 0.1–1.0)
Monocytes Relative: 8 % (ref 3–12)
Neutro Abs: 3.6 10*3/uL (ref 1.7–7.7)
Neutrophils Relative %: 69 % (ref 43–77)
PLATELETS: 260 10*3/uL (ref 150–400)
RBC: 4.48 MIL/uL (ref 3.87–5.11)
RDW: 16.1 % — ABNORMAL HIGH (ref 11.5–15.5)
WBC: 5.2 10*3/uL (ref 4.0–10.5)

## 2015-04-17 LAB — COMPREHENSIVE METABOLIC PANEL
ALT: 264 U/L — ABNORMAL HIGH (ref 14–54)
AST: 311 U/L — AB (ref 15–41)
Albumin: 3.5 g/dL (ref 3.5–5.0)
Alkaline Phosphatase: 581 U/L — ABNORMAL HIGH (ref 38–126)
Anion gap: 9 (ref 5–15)
BUN: 14 mg/dL (ref 6–20)
CHLORIDE: 103 mmol/L (ref 101–111)
CO2: 24 mmol/L (ref 22–32)
CREATININE: 0.8 mg/dL (ref 0.44–1.00)
Calcium: 9.4 mg/dL (ref 8.9–10.3)
Glucose, Bld: 125 mg/dL — ABNORMAL HIGH (ref 65–99)
Potassium: 4 mmol/L (ref 3.5–5.1)
Sodium: 136 mmol/L (ref 135–145)
Total Bilirubin: 10.4 mg/dL — ABNORMAL HIGH (ref 0.3–1.2)
Total Protein: 7.6 g/dL (ref 6.5–8.1)

## 2015-04-17 LAB — GLUCOSE, CAPILLARY: GLUCOSE-CAPILLARY: 107 mg/dL — AB (ref 65–99)

## 2015-04-17 SURGERY — UPPER ENDOSCOPIC ULTRASOUND (EUS) LINEAR
Anesthesia: Monitor Anesthesia Care

## 2015-04-17 MED ORDER — LACTATED RINGERS IV SOLN
INTRAVENOUS | Status: DC
  Administered 2015-04-17: 1000 mL via INTRAVENOUS

## 2015-04-17 MED ORDER — ONDANSETRON HCL 4 MG/2ML IJ SOLN
INTRAMUSCULAR | Status: DC | PRN
  Administered 2015-04-17: 4 mg via INTRAVENOUS

## 2015-04-17 MED ORDER — SODIUM CHLORIDE 0.9 % IV SOLN: INTRAVENOUS | Status: DC

## 2015-04-17 MED ORDER — PROPOFOL 10 MG/ML IV BOLUS
INTRAVENOUS | Status: AC
  Filled 2015-04-17: qty 20

## 2015-04-17 MED ORDER — KETAMINE HCL 10 MG/ML IJ SOLN
INTRAMUSCULAR | Status: DC | PRN
  Administered 2015-04-17: 20 mg via INTRAVENOUS

## 2015-04-17 MED ORDER — PROPOFOL INFUSION 10 MG/ML OPTIME
INTRAVENOUS | Status: DC | PRN
  Administered 2015-04-17: 200 ug/kg/min via INTRAVENOUS

## 2015-04-17 MED ORDER — ESMOLOL HCL 10 MG/ML IV SOLN
INTRAVENOUS | Status: DC | PRN
  Administered 2015-04-17 (×2): 20 mg via INTRAVENOUS

## 2015-04-17 MED ORDER — ONDANSETRON HCL 4 MG/2ML IJ SOLN
INTRAMUSCULAR | Status: AC
  Filled 2015-04-17: qty 2

## 2015-04-17 MED ORDER — LIDOCAINE HCL (CARDIAC) 20 MG/ML IV SOLN
INTRAVENOUS | Status: AC
  Filled 2015-04-17: qty 5

## 2015-04-17 MED ORDER — BUTAMBEN-TETRACAINE-BENZOCAINE 2-2-14 % EX AERO
INHALATION_SPRAY | CUTANEOUS | Status: DC | PRN
  Administered 2015-04-17: 1 via TOPICAL

## 2015-04-17 MED ORDER — LIDOCAINE HCL (CARDIAC) 20 MG/ML IV SOLN
INTRAVENOUS | Status: DC | PRN
  Administered 2015-04-17: 100 mg via INTRAVENOUS

## 2015-04-17 MED ORDER — KETAMINE HCL 10 MG/ML IJ SOLN
INTRAMUSCULAR | Status: AC
  Filled 2015-04-17: qty 1

## 2015-04-17 MED ORDER — ESMOLOL HCL 10 MG/ML IV SOLN
INTRAVENOUS | Status: AC
  Filled 2015-04-17: qty 10

## 2015-04-17 NOTE — H&P (View-Only) (Signed)
Cardiology Office Note  Date: 03/24/2015   ID: APRYLL HINKLE, DOB 10/12/1932, MRN [id number]  PCP: [name], NP  Primary Cardiologist: Rozann Lesches, MD   Chief Complaint  Patient presents with  . Carotid artery disease    History of Present Illness: Veronica Stevenson is a 81 y.o. female referred for cardiology consultation by Ms. Strader NP. I reviewed her records. She was recently referred for carotid Dopplers in follow-up of bruits. This was followed by a CT angiogram of the neck to further clarify her anatomy. Results are outlined below. In summary, she was not found to have any significant internal carotid artery stenoses, but had a 90% left external carotid stenosis.   She reports no history of stroke, no neurological symptoms or changes in vision. She reports no angina or increasing shortness of breath with regular activities. Main complaint is of weight loss, she is undergoing a gastrointestinal evaluation, possibly to pursue an endoscopy. She does have a history of chronic pancreatitis.  I discussed the results of her carotid studies. Since this is an external carotid artery stenosis, and she has no significant obstructive internal carotid artery disease on either side, there is no indication for any specific intervention other than medical therapy for atherosclerosis. Carotid bruit on the left would be consistent with these findings.   Past Medical History  Diagnosis Date  . Essential hypertension   . Hyperlipidemia   . Asthmatic bronchitis   . Chronic pancreatitis   . Osteoarthritis of knee   . Atypical ductal hyperplasia of right breast   . Vitamin D deficiency   . GERD (gastroesophageal reflux disease)   . Hyperparathyroidism   . Zollinger-Ellison syndrome   . Carotid artery disease     Past Surgical History  Procedure Laterality Date  . Colonoscopy  2009    [place]  . Tonsillectomy    . Cataract extraction, bilateral Bilateral   .  Parathyroidectomy    . Ovarian cyst removal      Current Outpatient Prescriptions  Medication Sig Dispense Refill  . acetaminophen (TYLENOL) 500 MG tablet Take 500 mg by mouth every 6 (six) hours as needed for mild pain or moderate pain.    Marland Kitchen aspirin EC 81 MG tablet Take 81 mg by mouth every morning.    Marland Kitchen atorvastatin (LIPITOR) 10 MG tablet Take 10 mg by mouth daily.    . Cholecalciferol (VITAMIN D) 2000 UNITS CAPS Take 1 capsule by mouth daily.    Marland Kitchen losartan (COZAAR) 50 MG tablet Take 50 mg by mouth every morning.     . metFORMIN (GLUCOPHAGE) 500 MG tablet Take 500 mg by mouth daily.    . metoprolol succinate (TOPROL-XL) 25 MG 24 hr tablet Take 25 mg by mouth every evening.      No current facility-administered medications for this visit.    Allergies:  Codeine; Omeprazole; and Penicillins   Social History: The patient  reports that she has never smoked. She has never used smokeless tobacco. She reports that she does not drink alcohol or use illicit drugs.   Family History: The patient's family history includes Breast cancer in her sister; CAD in her mother; Stroke in her brother and father.   ROS:  Please see the history of present illness. Otherwise, complete review of systems is positive for weight loss.  All other systems are reviewed and negative.   Physical Exam: VS:  BP 151/76 mmHg  Pulse 60  Ht 5\' 4"  (1.626 m)  Wt 126 lb 12.8 oz (57.516 kg)  BMI 21.75 kg/m2  SpO2 97%, BMI Body mass index is 21.75 kg/(m^2).  Wt Readings from Last 3 Encounters:  03/24/15 126 lb 12.8 oz (57.516 kg)  02/25/15 129 lb (58.514 kg)  01/23/15 135 lb (61.236 kg)     General: Patient appears comfortable at rest. HEENT: Conjunctiva and lids normal, oropharynx clear. Neck: Supple, no elevated JVP, left carotid bruit, no thyromegaly. Lungs: Clear to auscultation, nonlabored breathing at rest. Cardiac: Regular rate and rhythm, no S3 or significant systolic murmur, no pericardial rub. Abdomen:  Soft, nontender, bowel sounds present, no guarding or rebound. Extremities: No pitting edema, distal pulses 2+. Skin: Warm and dry. Musculoskeletal: No kyphosis. Neuropsychiatric: Alert and oriented x3, affect grossly appropriate.   ECG: Tracing from 01/08/2015 showed normal sinus rhythm, rule out right atrial enlargement.   Recent Labwork: 01/23/2015: ALT 13; AST 19; BUN 17; Creat 0.95; Hemoglobin 12.1; Platelets 281; Potassium 4.4; Sodium 140     Component Value Date/Time   CHOL 110 01/09/2015 0555   TRIG 57 01/09/2015 0555   HDL 50 01/09/2015 0555   CHOLHDL 2.2 01/09/2015 0555   VLDL 11 01/09/2015 0555   LDLCALC 49 01/09/2015 0555  02/19/2015: Hemoglobin 12.5, platelets 224, cholesterol 171, triglycerides 92, HDL 56, LDL 97, hemoglobin A1c 7.1, BUN 14, creatinine 0.9, potassium 4.5, AST 24, ALT 20  Other Studies Reviewed Today:  1. Carotid Dopplers 03/06/2015: IMPRESSION: Minimal plaque formation is noted in the proximal right internal carotid artery consistent with less than 50% diameter stenosis based on ultrasound and Doppler criteria.  Moderate irregular plaque formation is noted in the proximal lef internal carotid artery based on ultrasound imaging, although no significant elevated velocities are noted currently on Doppler examination. However, significantly elevated velocities were noted on the prior exam of 2014. Given the incongruity between ultrasound and Doppler findings, as well as difference compared with prior exam of 2014, CT angiography is recommended for further evaluation of possibly significant left internal carotid artery stenosis.  2. CT angiogram of the neck 03/17/2015: IMPRESSION: Right carotid bifurcation appears normal.  Some atherosclerotic disease at the left carotid bifurcation. No stenosis of the internal carotid artery. Small ulcerations/irregularities at the bifurcation and the proximal bulb. Severe stenosis of the external carotid artery,  90% with luminal diameter of 1 mm or less at the origin.   Assessment and Plan:  1. Asymptomatic, 90% left external carotid artery stenosis, with no significant obstructive internal carotid artery disease bilaterally. No indication for further intervention other than medical therapy directed at atherosclerosis. She is already on an aspirin and statin. We will arrange a follow-up carotid duplex scan for one year.  2. Essential hypertension, currently on Cozaar and Toprol-XL. Keep regular follow-up with primary care provider.  3. Hyperlipidemia, currently on Lipitor.  Current medicines were reviewed with the patient today.  Disposition: FU with me in 1 year.   Signed, Satira Sark, MD, [facility] 03/24/2015 3:31 PM    [facility], [place], [place] [address] Phone: [phone]; Fax: [phone]

## 2015-04-17 NOTE — Op Note (Signed)
[facility] [place], [address]   ENDOSCOPIC ULTRASOUND PROCEDURE REPORT  PATIENT: Veronica Stevenson, Veronica Stevenson  MR#: [id number] BIRTHDATE: 1933/05/25  GENDER: female ENDOSCOPIST: Milus Banister, MD REFERRED BY:  Garfield Cornea, M.D. PROCEDURE DATE:  04/17/2015 PROCEDURE:   Upper EUS ASA CLASS:      Class III INDICATIONS:   1.  recent acute pancreatitis, follow up MRI suggested very dilated main pancreatic duct, somewhat dilated CBD, + weight loss, normal LFTs (2 months ago). MEDICATIONS: Monitored anesthesia care  DESCRIPTION OF PROCEDURE:   After the risks benefits and alternatives of the procedure were  explained, informed consent was obtained. The patient was then placed in the left, lateral, decubitus postion and IV sedation was administered. Throughout the procedure, the patients blood pressure, pulse and oxygen saturations were monitored continuously.  Under direct visualization, the PENTAX EUS SCOPE  endoscope was introduced through the mouth  and advanced to the second portion of the duodenum .  Water was used as necessary to provide an acoustic interface.  Upon completion of the imaging, water was removed and the patient was sent to the recovery room in satisfactory condition.  Endoscopic findings: 1. Essentially normal UGI tract including good views of normal appearing major papilla (with side viewing scope) 2. Tortuous pylorus, duodenal bulb  EUS findings: 1. Hyopechoic, heterogeneous, indistinctly bordered mass in head of pancreas that measures 2.5cm by 1.7cm across. This is causing signficant dilation of the main pancreati duct (up to 1.2cm in the head) and also dilation of the CBD (55mm).  The mass does not appear to involve SMV, PV, celiac trunk, SMA on this exam.  I was unable to advance the linear echoendoscope into position to sample the mass due to tortuous pylorus and duodenal bulb. 2. No peripancreatic adenopathy 3. Limited views  of liver, spleen, portal and splenic vessesl were all normal.  ENDOSCOPIC IMPRESSION: 1.7cm by 2.5cm mass in head of pancreas causing dilation of pancreatic duct and common bile duct. This is suspicious for malignancy. No obvious major vessel involvement.  Tortuous distal stomach, duodenal bulb precluded FNA attempt (could not advance the linear echoendoscope into position to obtain tissue).  RECOMMENDATIONS: CT scan (chest, abd, pelvis) and labs (cbc, cmet, ca 19-9).  My office will arrange these.  _______________________________ eSigned:  Milus Banister, MD 04/17/2015 8:28 AM

## 2015-04-17 NOTE — Telephone Encounter (Signed)
Left message on machine to call back  

## 2015-04-17 NOTE — Transfer of Care (Signed)
Immediate Anesthesia Transfer of Care Note  Patient: Veronica Stevenson  Procedure(s) Performed: Procedure(s): UPPER ENDOSCOPIC ULTRASOUND (EUS) LINEAR (N/A)  Patient Location: PACU  Anesthesia Type:MAC  Level of Consciousness:  sedated, patient cooperative and responds to stimulation  Airway & Oxygen Therapy:Patient Spontanous Breathing and Patient connected to face mask oxgen  Post-op Assessment:  Report given to PACU RN and Post -op Vital signs reviewed and stable  Post vital signs:  Reviewed and stable  Last Vitals:  Filed Vitals:   04/17/15 0635  BP: 184/81  Pulse: 68  Temp: 36.7 C  Resp: 16    Complications: No apparent anesthesia complications

## 2015-04-17 NOTE — Anesthesia Postprocedure Evaluation (Signed)
  Anesthesia Post-op Note  Patient: Veronica Stevenson  Procedure(s) Performed: Procedure(s) (LRB): UPPER ENDOSCOPIC ULTRASOUND (EUS) LINEAR (N/A)  Patient Location: PACU  Anesthesia Type: MAC  Level of Consciousness: awake and alert   Airway and Oxygen Therapy: Patient Spontanous Breathing  Post-op Pain: mild  Post-op Assessment: Post-op Vital signs reviewed, Patient's Cardiovascular Status Stable, Respiratory Function Stable, Patent Airway and No signs of Nausea or vomiting  Last Vitals:  Filed Vitals:   04/17/15 0826  BP: 180/73  Pulse: 70  Temp:   Resp: 21    Post-op Vital Signs: stable   Complications: No apparent anesthesia complications

## 2015-04-17 NOTE — Discharge Instructions (Signed)
YOU HAD AN ENDOSCOPIC PROCEDURE TODAY: Refer to the procedure report that was given to you for any specific questions about what was found during the examination.  If the procedure report does not answer your questions, please call your gastroenterologist to clarify.  YOU SHOULD EXPECT: Some feelings of bloating in the abdomen. Passage of more gas than usual.  Walking can help get rid of the air that was put into your GI tract during the procedure and reduce the bloating. If you had a lower endoscopy (such as a colonoscopy or flexible sigmoidoscopy) you may notice spotting of blood in your stool or on the toilet paper.   DIET: Your first meal following the procedure should be a light meal and then it is ok to progress to your normal diet.  A half-sandwich or bowl of soup is an example of a good first meal.  Heavy or fried foods are harder to digest and may make you feel nasueas or bloated.  Drink plenty of fluids but you should avoid alcoholic beverages for 24 hours.  ACTIVITY: Your care partner should take you home directly after the procedure.  You should plan to take it easy, moving slowly for the rest of the day.  You can resume normal activity the day after the procedure however you should NOT DRIVE or use heavy machinery for 24 hours (because of the sedation medicines used during the test).    SYMPTOMS TO REPORT IMMEDIATELY  A gastroenterologist can be reached at any hour.  Please call your doctor's office for any of the following symptoms:   Following lower endoscopy (colonoscopy, flexible sigmoidoscopy)  Excessive amounts of blood in the stool  Significant tenderness, worsening of abdominal pains  Swelling of the abdomen that is new, acute  Fever of 100 or higher  Following upper endoscopy (EGD, EUS, ERCP)  Vomiting of blood or coffee ground material  New, significant abdominal pain  New, significant chest pain or pain under the shoulder blades  Painful or persistently difficult  swallowing  New shortness of breath  Black, tarry-looking stools  FOLLOW UP: If any biopsies were taken you will be contacted by phone or by letter within the next 1-3 weeks.  Call your gastroenterologist if you have not heard about the biopsies in 3 weeks.  Please also call your gastroenterologist's office with any specific questions about appointments or follow up tests. Esophagogastroduodenoscopy Care After Refer to this sheet in the next few weeks. These instructions provide you with information on caring for yourself after your procedure. Your caregiver may also give you more specific instructions. Your treatment has been planned according to current medical practices, but problems sometimes occur. Call your caregiver if you have any problems or questions after your procedure.  HOME CARE INSTRUCTIONS  Do not eat or drink anything until the numbing medicine (local anesthetic) has worn off and your gag reflex has returned. You will know that the local anesthetic has worn off when you can swallow comfortably.  Do not drive for 12 hours after the procedure or as directed by your caregiver.  Only take medicines as directed by your caregiver. SEEK MEDICAL CARE IF:   You cannot stop coughing.  You are not urinating at all or less than usual. SEEK IMMEDIATE MEDICAL CARE IF:  You have difficulty swallowing.  You cannot eat or drink.  You have worsening throat or chest pain.  You have dizziness, lightheadedness, or you faint.  You have nausea or vomiting.  You have chills.  You have a fever.  You have severe abdominal pain.  You have black, tarry, or bloody stools. Document Released: 09/13/2012 Document Reviewed: 09/13/2012 [facility] Patient Information 2015 [place]. This information is not intended to replace advice given to you by your health care provider. Make sure you discuss any questions you have with your health care provider.

## 2015-04-17 NOTE — Telephone Encounter (Signed)
Pt has been instructed and will go to [facility] and pick up contrast by lunch tomorrow.

## 2015-04-17 NOTE — Interval H&P Note (Signed)
History and Physical Interval Note:  04/17/2015 7:19 AM  Veronica Stevenson  has presented today for surgery, with the diagnosis of abnormal pancreas  The various methods of treatment have been discussed with the patient and family. After consideration of risks, benefits and other options for treatment, the patient has consented to  Procedure(s): UPPER ENDOSCOPIC ULTRASOUND (EUS) LINEAR (N/A) as a surgical intervention .  The patient's history has been reviewed, patient examined, no change in status, stable for surgery.  I have reviewed the patient's chart and labs.  Questions were answered to the patient's satisfaction.     Milus Banister

## 2015-04-17 NOTE — Telephone Encounter (Signed)
-----   Message from Milus Banister, MD sent at 04/17/2015  8:29 AM EDT -----   ENDOSCOPIC IMPRESSION: 1.7cm by 2.5cm mass in head of pancreas causing dilation of pancreatic duct and common bile duct. This is suspicious for malignancy. No obvious major vessel involvement.  Tortuous distal stomach, duodenal bulb precluded FNA attempt (could not advance the linear echoendoscope into position to obtain tissue).   RECOMMENDATIONS: CT scan (chest, abd, pelvis) and labs (cbc, cmet, ca 19-9).  My office will arrange these.      EUS today, see above.    Veronica Stevenson, can you arrange for CT scan chest/abd/pelvis.  Will get labs drawn here in recovery.  Ronalee Belts and Randall Hiss: see above.  My plan is to get up to date imaging with CT.  Also LFTs. Will decide on repeat biopsy attempt after that.  I suspect she will need biliary decompression (LFTs were normal 2 months ago, checking again today).  I'll keep you informed.  Thanks

## 2015-04-17 NOTE — Telephone Encounter (Signed)
You have been scheduled for a CT scan of the abdomen and pelvis at [facility] are scheduled on 04/18/15 at 530 pm. You should arrive 15 minutes prior to your appointment time for registration. Please follow the written instructions below on the day of your exam:  WARNING: IF YOU ARE ALLERGIC TO IODINE/X-RAY DYE, PLEASE NOTIFY RADIOLOGY IMMEDIATELY AT [phone]! YOU WILL BE GIVEN A 13 HOUR PREMEDICATION PREP.  1) Do not eat or drink anything after 130 pm (4 hours prior to your test) 2) You have been given 2 bottles of oral contrast to drink. The solution may taste better if refrigerated, but do NOT add ice or any other liquid to this solution. Shake well before drinking.    Drink 1 bottle of contrast @ 330 pm (2 hours prior to your exam)  Drink 1 bottle of contrast @ 430 pm (1 hour prior to your exam)  You may take any medications as prescribed with a small amount of water except for the following: Metformin, Glucophage, Glucovance, Avandamet, Riomet, Fortamet, Actoplus Met, Janumet, Glumetza or Metaglip. The above medications must be held the day of the exam AND 48 hours after the exam.  The purpose of you drinking the oral contrast is to aid in the visualization of your intestinal tract. The contrast solution may cause some diarrhea. Before your exam is started, you will be given a small amount of fluid to drink. Depending on your individual set of symptoms, you may also receive an intravenous injection of x-ray contrast/dye. Plan on being at [facility] for 30 minutes or long, depending on the type of exam you are having performed.  This test typically takes 30-45 minutes to complete.   ________________________________________________________________________

## 2015-04-18 ENCOUNTER — Ambulatory Visit (HOSPITAL_COMMUNITY)
Admission: RE | Admit: 2015-04-18 | Discharge: 2015-04-18 | Disposition: A | Discharge status: Home / Self Care | Payer: Medicare Other | Source: Ambulatory Visit | Attending: Gastroenterology | Admitting: Gastroenterology

## 2015-04-18 ENCOUNTER — Encounter (HOSPITAL_COMMUNITY): Payer: Self-pay | Admitting: Gastroenterology

## 2015-04-18 ENCOUNTER — Other Ambulatory Visit: Payer: Self-pay | Admitting: Gastroenterology

## 2015-04-18 DIAGNOSIS — R634 Abnormal weight loss: Secondary | ICD-10-CM

## 2015-04-18 DIAGNOSIS — R748 Abnormal levels of other serum enzymes: Secondary | ICD-10-CM

## 2015-04-18 DIAGNOSIS — R109 Unspecified abdominal pain: Secondary | ICD-10-CM

## 2015-04-18 DIAGNOSIS — K859 Acute pancreatitis, unspecified: Secondary | ICD-10-CM

## 2015-04-18 DIAGNOSIS — K8689 Other specified diseases of pancreas: Secondary | ICD-10-CM

## 2015-04-18 DIAGNOSIS — R933 Abnormal findings on diagnostic imaging of other parts of digestive tract: Secondary | ICD-10-CM

## 2015-04-18 DIAGNOSIS — R932 Abnormal findings on diagnostic imaging of liver and biliary tract: Secondary | ICD-10-CM

## 2015-04-18 DIAGNOSIS — R1013 Epigastric pain: Secondary | ICD-10-CM

## 2015-04-18 DIAGNOSIS — R1033 Periumbilical pain: Secondary | ICD-10-CM | POA: Diagnosis present

## 2015-04-18 DIAGNOSIS — K869 Disease of pancreas, unspecified: Secondary | ICD-10-CM | POA: Insufficient documentation

## 2015-04-18 LAB — CANCER ANTIGEN 19-9: CA 19-9: 701 U/mL — ABNORMAL HIGH (ref 0–35)

## 2015-04-18 MED ORDER — IOHEXOL 300 MG/ML  SOLN
100.0000 mL | Freq: Once | INTRAMUSCULAR | Status: AC | PRN
  Administered 2015-04-18: 100 mL via INTRAVENOUS

## 2015-04-18 NOTE — Progress Notes (Signed)
17:14  Code stroke beeper 1715 call from ED 1720 patient in scanner, exam begun 1726 pt finished, returning to ED [place] with Lauro Regulus. Images sent to [facility] from scanner/bbj

## 2015-04-22 ENCOUNTER — Other Ambulatory Visit: Payer: Self-pay

## 2015-04-22 ENCOUNTER — Encounter (HOSPITAL_COMMUNITY)
Admission: RE | Admit: 2015-04-22 | Discharge: 2015-04-22 | Disposition: A | Discharge status: Home / Self Care | Payer: Medicare Other | Source: Ambulatory Visit | Attending: Internal Medicine | Admitting: Internal Medicine

## 2015-04-22 ENCOUNTER — Encounter: Payer: Self-pay | Admitting: Gastroenterology

## 2015-04-22 ENCOUNTER — Encounter (HOSPITAL_COMMUNITY): Payer: Self-pay

## 2015-04-22 ENCOUNTER — Ambulatory Visit (INDEPENDENT_AMBULATORY_CARE_PROVIDER_SITE_OTHER): Payer: Medicare Other | Admitting: Gastroenterology

## 2015-04-22 VITALS — BP 138/77 | HR 78 | Temp 98.2°F | Ht 64.0 in | Wt 119.8 lb

## 2015-04-22 DIAGNOSIS — I6522 Occlusion and stenosis of left carotid artery: Secondary | ICD-10-CM | POA: Diagnosis not present

## 2015-04-22 DIAGNOSIS — K8689 Other specified diseases of pancreas: Secondary | ICD-10-CM

## 2015-04-22 DIAGNOSIS — K838 Other specified diseases of biliary tract: Secondary | ICD-10-CM | POA: Diagnosis not present

## 2015-04-22 DIAGNOSIS — K869 Disease of pancreas, unspecified: Secondary | ICD-10-CM

## 2015-04-22 DIAGNOSIS — K831 Obstruction of bile duct: Secondary | ICD-10-CM

## 2015-04-22 MED ORDER — LEVOFLOXACIN 500 MG PO TABS: 500.0000 mg | ORAL_TABLET | Freq: Every day | ORAL | Status: DC

## 2015-04-22 NOTE — Progress Notes (Signed)
Primary Care Physician:  [name], NP  Primary Gastroenterologist:  Garfield Cornea, MD   Chief Complaint  Patient presents with  . Follow-up    ERCP    HPI:  Veronica Stevenson is a 81 y.o. female here to schedule ERCP with stenting for obstructing pancreatic head mass.  Patient initially seen by our practice in April 2016 for follow-up of admission for pancreatitis. She was managed by the hospitalist during this hospitalization. She reports her first episode of pancreatitis around January or February of this year at which time she was seen in the emergency department at [facility].   Prior studies as outlined below. -CT of the abdomen and pelvis with contrast January 2016 at [facility] Noted to have pancreatic duct mildly dilated, pancreatic head appeared atrophic. No pancreatic head mass seen and no biliary ductal dilation. -Right upper quadrant abdominal ultrasound 12/2014 Unremarkable, no comment on pancreas.  Since seen by our practice she underwent MRI abdomen/MRCP 02/10/2015 which showed mild intrahepatic and extrahepatic biliary dilation, CBD 9.5 mm. Significant dilation of the main pancreatic duct to the maximum of 8 mm. Side branches also dilated. Mild associated pancreatic atrophy. Cystic changes noted in the pancreatic uncinate process. No obvious obstructing pancreatic head mass or ampullary lesion.   Subsequently saw Dr. Ardis Hughs for endoscopic ultrasound on 04/17/2015 which showed a 1.7 x 2.5 cm mass in the head of the pancreas causing dilation of the pancreatic duct and common bile duct. Suspicious for malignancy. No obvious major vessel involvement. Tortuous distal stomach, duodenal bulb precluded FNA attempt.  -CT chest/abdomen/pelvis with without contrast 04/18/2015 Severe intra-and extra hepatic biliary dilation as well as pancreatic ductal dilation secondary to 3.0 x 2.6 cm pancreatic head mass.  Patient presents today with recent onset jaundice for  about one week. Labs 5 days ago showed a total bilirubin of 10.4, alkaline phosphatase 581, AST 311, ALT 264, albumin 3.5. LFTs were normal on 01/23/2015. CA-19-9 [id number]. Intermittent abdominal pain (RUQ/epigastric). Sometimes can be severe. Using Tylenol when necessary. Bowel movements regular, clay appearing. No blood in the stool. No heartburn. Is able to eat and drink and stay hydrated at this point. Denies any fever or chills.   Diagnosed with DM 02/2015.   Current Outpatient Prescriptions  Medication Sig Dispense Refill  . acetaminophen (TYLENOL) 500 MG tablet Take 500 mg by mouth every 6 (six) hours as needed for mild pain or moderate pain.    Marland Kitchen aspirin EC 81 MG tablet Take 81 mg by mouth every morning.    Marland Kitchen atorvastatin (LIPITOR) 10 MG tablet Take 10 mg by mouth daily after lunch.     . Cholecalciferol (VITAMIN D) 2000 UNITS CAPS Take 1 capsule by mouth every morning.     Marland Kitchen losartan (COZAAR) 50 MG tablet Take 50 mg by mouth every morning.     . metFORMIN (GLUCOPHAGE) 500 MG tablet Take 500 mg by mouth daily after lunch.     . metoprolol succinate (TOPROL-XL) 25 MG 24 hr tablet Take 25 mg by mouth every evening.      No current facility-administered medications for this visit.    Allergies as of 04/22/2015 - Review Complete 04/17/2015  Allergen Reaction Noted  . Codeine Itching 03/26/2012  . Omeprazole Nausea And Vomiting 02/25/2015  . Penicillins Other (See Comments) 03/26/2012    Past Medical History  Diagnosis Date  . Essential hypertension   . Hyperlipidemia   . Asthmatic bronchitis   . Chronic pancreatitis   . Osteoarthritis  of knee   . Atypical ductal hyperplasia of right breast   . Vitamin D deficiency   . GERD (gastroesophageal reflux disease)   . Hyperparathyroidism   . Zollinger-Ellison syndrome   . Carotid artery disease     Past Surgical History  Procedure Laterality Date  . Colonoscopy  2009    [place]  . Tonsillectomy    . Cataract extraction, bilateral  Bilateral   . Parathyroidectomy    . Ovarian cyst removal    . Eus N/A 04/17/2015    Jacobs-1.7 cm x 2.5 cm mass in head of pancreas causing dilation of pancreatic duct and common bile duct.    Family History  Problem Relation Age of Onset  . CAD Mother   . Stroke Father   . Stroke Brother   . Breast cancer Sister     History   Social History  . Marital Status: Single    Spouse Name: N/A  . Number of Children: N/A  . Years of Education: N/A   Occupational History  . Not on file.   Social History Main Topics  . Smoking status: Never Smoker   . Smokeless tobacco: Never Used  . Alcohol Use: No  . Drug Use: No  . Sexual Activity: Not Currently   Other Topics Concern  . Not on file   Social History Narrative      ROS:  General: Negative for anorexia,  fever, chills, fatigue, weakness. 20 pound weight loss this year. 7 pound in last week. Eyes: Negative for vision changes.  ENT: Negative for hoarseness, difficulty swallowing , nasal congestion. CV: Negative for chest pain, angina, palpitations, dyspnea on exertion, peripheral edema.  Respiratory: Negative for dyspnea at rest, dyspnea on exertion, cough, sputum, wheezing.  GI: See history of present illness. GU:  Negative for dysuria, hematuria, urinary incontinence, urinary frequency, nocturnal urination.  MS: Negative for joint pain, low back pain.  Derm: Negative for rash or itching.  Neuro: Negative for weakness, abnormal sensation, seizure, frequent headaches, memory loss, confusion.  Psych: Negative for anxiety, depression, suicidal ideation, hallucinations.  Endo: see general  Heme: Negative for bruising or bleeding. Allergy: Negative for rash or hives.    Physical Examination:  BP 138/77 mmHg  Pulse 78  Temp(Src) 98.2 F (36.8 C) (Oral)  Ht 5\' 4"  (1.626 m)  Wt 119 lb 12.8 oz (54.341 kg)  BMI 20.55 kg/m2   General: Well-nourished, well-developed in no acute distress.  Head: Normocephalic, atraumatic.    Eyes: Conjunctiva pink, no icterus. Mouth: Oropharyngeal mucosa moist and pink , no lesions erythema or exudate. Neck: Supple without thyromegaly, masses, or lymphadenopathy.  Lungs: Clear to auscultation bilaterally.  Heart: Regular rate and rhythm, no murmurs rubs or gallops.  Abdomen: Bowel sounds are normal, mild epigastric/ruq tenderness, nondistended, no hepatosplenomegaly or masses, no abdominal bruits or    hernia , no rebound or guarding.   Rectal: not performed Extremities: No lower extremity edema. No clubbing or deformities.  Neuro: Alert and oriented x 4 , grossly normal neurologically.  Skin: Warm and dry, no rash or jaundice.   Psych: Alert and cooperative, normal mood and affect.  Labs: Lab Results  Component Value Date   WBC 5.2 04/17/2015   HGB 12.9 04/17/2015   HCT 37.3 04/17/2015   MCV 83.3 04/17/2015   PLT 260 04/17/2015   Lab Results  Component Value Date   CREATININE 0.80 04/17/2015   BUN 14 04/17/2015   NA 136 04/17/2015   K 4.0 04/17/2015  CL 103 04/17/2015   CO2 24 04/17/2015   Lab Results  Component Value Date   ALT 264* 04/17/2015   AST 311* 04/17/2015   ALKPHOS 581* 04/17/2015   BILITOT 10.4* 04/17/2015   No results found for: INR, PROTIME   Imaging Studies: Ct Abdomen Pelvis W Wo Contrast  04/18/2015   CLINICAL DATA:  Periumbilical abdominal pain for 2 weeks. Pancreatic mass seen on endoscopy.  EXAM: CT CHEST WITH CONTRAST  CT ABDOMEN AND PELVIS WITH AND WITHOUT CONTRAST  TECHNIQUE: Multidetector CT imaging of the chest was performed during intravenous contrast administration. Multidetector CT imaging of the abdomen and pelvis was performed following the standard protocol before and during bolus administration of intravenous contrast.  CONTRAST:  152mL OMNIPAQUE IOHEXOL 300 MG/ML  SOLN  COMPARISON:  CT scan of chest of July 27, 2006. CT scan of abdomen pelvis of October 31, 2014.  FINDINGS: CT CHEST FINDINGS  No pneumothorax or pleural  effusion is noted. No acute pulmonary disease is noted. There is no evidence of thoracic aortic dissection or aneurysm. Pulmonary arteries appear normal. No mediastinal mass or adenopathy is noted. No significant osseous abnormality is noted in the chest.  CT ABDOMEN AND PELVIS FINDINGS  No gallstones are noted. Gallbladder is distended. Severe intrahepatic and extrahepatic biliary dilatation is noted, as well as pancreatic ductal dilatation, secondary to 3.0 x 2.6 cm pancreatic head mass. The spleen appears normal. Adrenal glands appear normal. No hydronephrosis or renal obstruction is noted. Stable left renal cyst is noted. Atherosclerosis of abdominal aorta is noted without aneurysm formation. There is no evidence of bowel obstruction. Diverticulosis of descending colon is noted without inflammation. The appendix appears normal. No abnormal fluid collection is noted. Uterus and ovaries appear normal. Urinary bladder appears normal. No significant adenopathy is noted. Multilevel degenerative disc disease is noted in the lower lumbar spine.  IMPRESSION: No significant abnormality seen in the chest.  Severe intrahepatic and extrahepatic biliary ductal and pancreatic ductal dilatation is noted secondary to 3 cm pancreatic head mass consistent with malignancy.   Electronically Signed   By: Marijo Conception, M.D.   On: 04/18/2015 19:17   Ct Chest W Contrast  04/18/2015   CLINICAL DATA:  Periumbilical abdominal pain for 2 weeks. Pancreatic mass seen on endoscopy.  EXAM: CT CHEST WITH CONTRAST  CT ABDOMEN AND PELVIS WITH AND WITHOUT CONTRAST  TECHNIQUE: Multidetector CT imaging of the chest was performed during intravenous contrast administration. Multidetector CT imaging of the abdomen and pelvis was performed following the standard protocol before and during bolus administration of intravenous contrast.  CONTRAST:  113mL OMNIPAQUE IOHEXOL 300 MG/ML  SOLN  COMPARISON:  CT scan of chest of July 27, 2006. CT scan of  abdomen pelvis of October 31, 2014.  FINDINGS: CT CHEST FINDINGS  No pneumothorax or pleural effusion is noted. No acute pulmonary disease is noted. There is no evidence of thoracic aortic dissection or aneurysm. Pulmonary arteries appear normal. No mediastinal mass or adenopathy is noted. No significant osseous abnormality is noted in the chest.  CT ABDOMEN AND PELVIS FINDINGS  No gallstones are noted. Gallbladder is distended. Severe intrahepatic and extrahepatic biliary dilatation is noted, as well as pancreatic ductal dilatation, secondary to 3.0 x 2.6 cm pancreatic head mass. The spleen appears normal. Adrenal glands appear normal. No hydronephrosis or renal obstruction is noted. Stable left renal cyst is noted. Atherosclerosis of abdominal aorta is noted without aneurysm formation. There is no evidence of bowel obstruction. Diverticulosis of  descending colon is noted without inflammation. The appendix appears normal. No abnormal fluid collection is noted. Uterus and ovaries appear normal. Urinary bladder appears normal. No significant adenopathy is noted. Multilevel degenerative disc disease is noted in the lower lumbar spine.  IMPRESSION: No significant abnormality seen in the chest.  Severe intrahepatic and extrahepatic biliary ductal and pancreatic ductal dilatation is noted secondary to 3 cm pancreatic head mass consistent with malignancy.   Electronically Signed   By: Marijo Conception, M.D.   On: 04/18/2015 19:17

## 2015-04-22 NOTE — Assessment & Plan Note (Signed)
81 year old female with 6-7 month history of abdominal pain associated with weight loss who has had multiple studies over the course of the past 6 months. More recent imaging has shown pancreatic head mass with no evidence of distant disease by CT. No apparent-vascular involvement on recent EUS. Unfortunately unable to obtain tissue sampling at time of EUS due to anatomy.   She presents now with obstructive jaundice related to pancreatic head mass. Discussed at length with patient. Plans for ERCP with brushings to hopefully obtain tissue diagnosis as well as sphincterotomy with stenting to alleviate biliary obstruction. Discussed that she may have temporary or permanent stenting as appropriate, to be determined by Dr. Gala Romney. The risks, benefits, limitations, alternatives, and imponderables have been reviewed with the patient. I specifically discussed a 1 in 10 chance of pancreatitis, reaction to medications, bleeding, perforation and the possibility of a failed ERCP. Potential for sphincterotomy and stent placement also reviewed. Questions have been answered. All parties agreeable. Cipro 400mg  IV on call to procedure.   In the meantime, provide coverage with Levaquin 500mg  daily to prevent cholangitis.

## 2015-04-22 NOTE — Patient Instructions (Signed)
Veronica Stevenson  04/22/2015     @PREFPERIOPPHARMACY @   Your procedure is scheduled on 04/23/2015  Report to [facility] at  950  A.M.  Call this number if you have problems the morning of surgery:  [phone]   Remember:  Do not eat food or drink liquids after midnight.  Take these medicines the morning of surgery with A SIP OF WATER  Cozaar, metoprolol. DO NOT take any medicine for your diabetes in the morning.   Do not wear jewelry, make-up or nail polish.  Do not wear lotions, powders, or perfumes.    Do not shave 48 hours prior to surgery.  Men may shave face and neck.  Do not bring valuables to the hospital.  [facility] is not responsible for any belongings or valuables.  Contacts, dentures or bridgework may not be worn into surgery.  Leave your suitcase in the car.  After surgery it may be brought to your room.  For patients admitted to the hospital, discharge time will be determined by your treatment team.  Patients discharged the day of surgery will not be allowed to drive home.   Name and phone number of your driver:   family Special instructions:  none  Please read over the following fact sheets that you were given. Pain Booklet, Coughing and Deep Breathing, Surgical Site Infection Prevention, Anesthesia Post-op Instructions and Care and Recovery After Surgery      Endoscopic Retrograde Cholangiopancreatography (ERCP) Endoscopic retrograde cholangiopancreatography (ERCP) is a procedure used to diagnosis many diseases of the pancreas, bile ducts, liver, and gallbladder. During ERCP a thin, lighted tube (endoscope) is passed through the mouth and down the back of the throat into the first part of the small intestine (duodenum). A small, plastic tube (cannula) is then passed through the endoscope and directed into the bile duct or pancreatic duct. Dye is then injected through the cannula and X-rays are taken to study the biliary and pancreatic passageways.  LET  [facility] CARE PROVIDER KNOW ABOUT:   Any allergies you have.   All medicines you are taking, including vitamins, herbs, eyedrops, creams, and over-the-counter medicines.   Previous problems you or members of your family have had with the use of anesthetics.   Any blood disorders you have.   Previous surgeries you have had.   Medical conditions you have. RISKS AND COMPLICATIONS Generally, ERCP is a safe procedure. However, as with any procedure, complications can occur. A simple removal of gallstones has the lowest rate of complications. Higher rates of complication occur in people who have poorly functioning bile or pancreatic ducts. Possible complications include:   Pancreatitis.  Bleeding.  Accidental punctures in the bowel wall, pancreas, or gall bladder.  Gall bladder or bile duct infection. BEFORE THE PROCEDURE   Do not eat or drink anything, including water, for at least 8 hours before the procedure or as directed by your health care provider.   Ask your health care provider whether you should stop taking certain medicines prior to your procedure.   Arrange for someone to drive you home. You will not be allowed to drive for 29-79 hours after the procedure. PROCEDURE   You will be given medicine through a vein (intravenously) to make you relaxed and sleepy.   You might have a breathing tube placed to give you medicine that makes you sleep (general anesthetic).   Your throat may be sprayed with medicine that numbs the area and prevents  gagging (local anesthetic), or you may gargle this medicine.   You will lie on your left side.   The endoscope will be inserted through your mouth and into the duodenum. The tube will not interfere with your breathing. Gagging is prevented by the anesthesia.   While X-rays are being taken, you may be positioned on your stomach.   A small sample of tissue (biopsy) may be removed for examination. AFTER THE PROCEDURE    You will rest in bed until you are fully conscious.   When you first wake up, your throat may feel slightly sore.   You will not be allowed to eat or drink until numbness subsides.   Once you are able to drink, urinate, and sit on the edge of the bed without feeling sick to your stomach (nauseous) or dizzy, you may be allowed to go home. Document Released: 06/22/2001 Document Revised: 07/18/2013 Document Reviewed: 05/08/2013 [facility] Patient Information 2015 [place], [place]. This information is not intended to replace advice given to you by your health care provider. Make sure you discuss any questions you have with your health care provider. PATIENT INSTRUCTIONS POST-ANESTHESIA  IMMEDIATELY FOLLOWING SURGERY:  Do not drive or operate machinery for the first twenty four hours after surgery.  Do not make any important decisions for twenty four hours after surgery or while taking narcotic pain medications or sedatives.  If you develop intractable nausea and vomiting or a severe headache please notify your doctor immediately.  FOLLOW-UP:  Please make an appointment with your surgeon as instructed. You do not need to follow up with anesthesia unless specifically instructed to do so.  WOUND CARE INSTRUCTIONS (if applicable):  Keep a dry clean dressing on the anesthesia/puncture wound site if there is drainage.  Once the wound has quit draining you may leave it open to air.  Generally you should leave the bandage intact for twenty four hours unless there is drainage.  If the epidural site drains for more than 36-48 hours please call the anesthesia department.  QUESTIONS?:  Please feel free to call your physician or the hospital operator if you have any questions, and they will be happy to assist you.

## 2015-04-22 NOTE — Progress Notes (Signed)
cc'ed to pcp °

## 2015-04-22 NOTE — Patient Instructions (Addendum)
Plans for procedure (ERCP) to put stent in your bile duct to open up blockage. See separate instructions.   Take antibiotics (Levaquin 500mg  daily) in the meantime to prevent infection. If you get fever greater than 100.78F please let us know immedicately.

## 2015-04-23 ENCOUNTER — Ambulatory Visit (HOSPITAL_COMMUNITY): Payer: Medicare Other | Admitting: Anesthesiology

## 2015-04-23 ENCOUNTER — Other Ambulatory Visit: Payer: Self-pay

## 2015-04-23 ENCOUNTER — Ambulatory Visit (HOSPITAL_COMMUNITY): Payer: Medicare Other

## 2015-04-23 ENCOUNTER — Encounter (HOSPITAL_COMMUNITY): Payer: Self-pay | Admitting: *Deleted

## 2015-04-23 ENCOUNTER — Encounter (HOSPITAL_COMMUNITY)
Admission: RE | Disposition: A | Discharge status: Home / Self Care | Payer: Self-pay | Source: Ambulatory Visit | Attending: Internal Medicine

## 2015-04-23 ENCOUNTER — Ambulatory Visit (HOSPITAL_COMMUNITY)
Admission: RE | Admit: 2015-04-23 | Discharge: 2015-04-23 | Disposition: A | Discharge status: Home / Self Care | Payer: Medicare Other | Source: Ambulatory Visit | Attending: Internal Medicine | Admitting: Internal Medicine

## 2015-04-23 DIAGNOSIS — I739 Peripheral vascular disease, unspecified: Secondary | ICD-10-CM | POA: Diagnosis not present

## 2015-04-23 DIAGNOSIS — E119 Type 2 diabetes mellitus without complications: Secondary | ICD-10-CM | POA: Insufficient documentation

## 2015-04-23 DIAGNOSIS — Z7982 Long term (current) use of aspirin: Secondary | ICD-10-CM | POA: Diagnosis not present

## 2015-04-23 DIAGNOSIS — K8689 Other specified diseases of pancreas: Secondary | ICD-10-CM

## 2015-04-23 DIAGNOSIS — E785 Hyperlipidemia, unspecified: Secondary | ICD-10-CM | POA: Diagnosis not present

## 2015-04-23 DIAGNOSIS — K219 Gastro-esophageal reflux disease without esophagitis: Secondary | ICD-10-CM | POA: Insufficient documentation

## 2015-04-23 DIAGNOSIS — K838 Other specified diseases of biliary tract: Secondary | ICD-10-CM | POA: Diagnosis not present

## 2015-04-23 DIAGNOSIS — Z8711 Personal history of peptic ulcer disease: Secondary | ICD-10-CM | POA: Insufficient documentation

## 2015-04-23 DIAGNOSIS — K868 Other specified diseases of pancreas: Secondary | ICD-10-CM | POA: Diagnosis not present

## 2015-04-23 DIAGNOSIS — K869 Disease of pancreas, unspecified: Secondary | ICD-10-CM | POA: Diagnosis not present

## 2015-04-23 DIAGNOSIS — Z79899 Other long term (current) drug therapy: Secondary | ICD-10-CM | POA: Diagnosis not present

## 2015-04-23 DIAGNOSIS — E213 Hyperparathyroidism, unspecified: Secondary | ICD-10-CM | POA: Diagnosis not present

## 2015-04-23 DIAGNOSIS — K831 Obstruction of bile duct: Secondary | ICD-10-CM | POA: Insufficient documentation

## 2015-04-23 DIAGNOSIS — I10 Essential (primary) hypertension: Secondary | ICD-10-CM | POA: Diagnosis not present

## 2015-04-23 HISTORY — PX: BILIARY STENT PLACEMENT: SHX5538

## 2015-04-23 HISTORY — PX: SPHINCTEROTOMY: SHX5544

## 2015-04-23 HISTORY — PX: ERCP: SHX5425

## 2015-04-23 LAB — GLUCOSE, CAPILLARY
Glucose-Capillary: 101 mg/dL — ABNORMAL HIGH (ref 65–99)
Glucose-Capillary: 131 mg/dL — ABNORMAL HIGH (ref 65–99)

## 2015-04-23 SURGERY — ERCP, WITH INTERVENTION IF INDICATED
Anesthesia: General

## 2015-04-23 MED ORDER — LACTATED RINGERS IV SOLN
INTRAVENOUS | Status: DC
  Administered 2015-04-23 (×2): via INTRAVENOUS

## 2015-04-23 MED ORDER — NEOSTIGMINE METHYLSULFATE 10 MG/10ML IV SOLN
INTRAVENOUS | Status: DC | PRN
  Administered 2015-04-23: 4 mg via INTRAVENOUS

## 2015-04-23 MED ORDER — LIDOCAINE HCL (CARDIAC) 20 MG/ML IV SOLN
INTRAVENOUS | Status: DC | PRN
  Administered 2015-04-23: 30 mg via INTRAVENOUS

## 2015-04-23 MED ORDER — GLYCOPYRROLATE 0.2 MG/ML IJ SOLN
INTRAMUSCULAR | Status: AC
  Filled 2015-04-23: qty 1

## 2015-04-23 MED ORDER — PROPOFOL 10 MG/ML IV BOLUS
INTRAVENOUS | Status: AC
  Filled 2015-04-23: qty 20

## 2015-04-23 MED ORDER — PROPOFOL 10 MG/ML IV BOLUS
INTRAVENOUS | Status: DC | PRN
  Administered 2015-04-23: 100 mg via INTRAVENOUS

## 2015-04-23 MED ORDER — ROCURONIUM BROMIDE 100 MG/10ML IV SOLN
INTRAVENOUS | Status: DC | PRN
  Administered 2015-04-23: 30 mg via INTRAVENOUS

## 2015-04-23 MED ORDER — GLYCOPYRROLATE 0.2 MG/ML IJ SOLN
INTRAMUSCULAR | Status: DC | PRN
  Administered 2015-04-23: 0.6 mg via INTRAVENOUS

## 2015-04-23 MED ORDER — ROCURONIUM BROMIDE 50 MG/5ML IV SOLN
INTRAVENOUS | Status: AC
  Filled 2015-04-23: qty 1

## 2015-04-23 MED ORDER — LIDOCAINE VISCOUS 2 % MT SOLN
OROMUCOSAL | Status: AC
  Filled 2015-04-23: qty 15

## 2015-04-23 MED ORDER — SODIUM CHLORIDE 0.9 % IV SOLN
INTRAVENOUS | Status: AC
  Filled 2015-04-23: qty 50

## 2015-04-23 MED ORDER — EPHEDRINE SULFATE 50 MG/ML IJ SOLN
INTRAMUSCULAR | Status: DC | PRN
  Administered 2015-04-23 (×2): 5 mg via INTRAVENOUS

## 2015-04-23 MED ORDER — ONDANSETRON HCL 4 MG/2ML IJ SOLN
INTRAMUSCULAR | Status: AC
  Filled 2015-04-23: qty 2

## 2015-04-23 MED ORDER — LIDOCAINE VISCOUS 2 % MT SOLN
15.0000 mL | Freq: Once | OROMUCOSAL | Status: AC
  Administered 2015-04-23: 10 mL via OROMUCOSAL

## 2015-04-23 MED ORDER — FENTANYL CITRATE (PF) 250 MCG/5ML IJ SOLN
INTRAMUSCULAR | Status: AC
  Filled 2015-04-23: qty 5

## 2015-04-23 MED ORDER — CIPROFLOXACIN IN D5W 400 MG/200ML IV SOLN
400.0000 mg | Freq: Once | INTRAVENOUS | Status: AC
  Administered 2015-04-23: 400 mg via INTRAVENOUS

## 2015-04-23 MED ORDER — ONDANSETRON HCL 4 MG/2ML IJ SOLN: 4.0000 mg | Freq: Once | INTRAMUSCULAR | Status: DC | PRN

## 2015-04-23 MED ORDER — STERILE WATER FOR IRRIGATION IR SOLN
Status: DC | PRN
  Administered 2015-04-23: 1000 mL

## 2015-04-23 MED ORDER — ONDANSETRON HCL 4 MG/2ML IJ SOLN
4.0000 mg | Freq: Once | INTRAMUSCULAR | Status: AC
  Administered 2015-04-23: 4 mg via INTRAVENOUS

## 2015-04-23 MED ORDER — GLYCOPYRROLATE 0.2 MG/ML IJ SOLN
0.2000 mg | Freq: Once | INTRAMUSCULAR | Status: AC
Start: 2015-04-23 — End: 2015-04-23
  Administered 2015-04-23: 0.2 mg via INTRAVENOUS

## 2015-04-23 MED ORDER — SUCCINYLCHOLINE CHLORIDE 20 MG/ML IJ SOLN
INTRAMUSCULAR | Status: AC
  Filled 2015-04-23: qty 1

## 2015-04-23 MED ORDER — MIDAZOLAM HCL 2 MG/2ML IJ SOLN
1.0000 mg | INTRAMUSCULAR | Status: DC | PRN
  Administered 2015-04-23: 2 mg via INTRAVENOUS

## 2015-04-23 MED ORDER — CIPROFLOXACIN IN D5W 400 MG/200ML IV SOLN
INTRAVENOUS | Status: AC
  Filled 2015-04-23: qty 200

## 2015-04-23 MED ORDER — FENTANYL CITRATE (PF) 250 MCG/5ML IJ SOLN
INTRAMUSCULAR | Status: DC | PRN
  Administered 2015-04-23 (×3): 50 ug via INTRAVENOUS

## 2015-04-23 MED ORDER — MIDAZOLAM HCL 2 MG/2ML IJ SOLN
INTRAMUSCULAR | Status: AC
  Filled 2015-04-23: qty 2

## 2015-04-23 MED ORDER — SODIUM CHLORIDE 0.9 % IV SOLN
INTRAVENOUS | Status: DC | PRN
  Administered 2015-04-23: 13:00:00

## 2015-04-23 MED ORDER — GLUCAGON HCL RDNA (DIAGNOSTIC) 1 MG IJ SOLR
INTRAMUSCULAR | Status: AC
  Administered 2015-04-23 (×2): .5 mg via INTRAVENOUS
  Filled 2015-04-23: qty 1

## 2015-04-23 MED ORDER — LIDOCAINE HCL (PF) 1 % IJ SOLN
INTRAMUSCULAR | Status: AC
  Filled 2015-04-23: qty 5

## 2015-04-23 MED ORDER — FENTANYL CITRATE (PF) 100 MCG/2ML IJ SOLN: 25.0000 ug | INTRAMUSCULAR | Status: DC | PRN

## 2015-04-23 SURGICAL SUPPLY — 29 items
BALLN RETRIEVAL 12X15 (BALLOONS) IMPLANT
BALLN RETRIEVAL 12X15MM (BALLOONS)
BALLN TIP RX DIL 6-4X5.8X180 (BALLOONS) IMPLANT
BALN RTRVL 200 6-7FR 12-15 (BALLOONS)
BASKET TRAPEZOID 3X6 (MISCELLANEOUS) IMPLANT
BASKET TRAPEZOID LITHO 2.5X5 (MISCELLANEOUS) IMPLANT
BLOCK BITE 60FR ADLT L/F BLUE (MISCELLANEOUS) ×2 IMPLANT
BRUSH CYTOL RX .035 2.1X8X200 (MISCELLANEOUS) ×2 IMPLANT
BSKT STON RTRVL TRAPEZOID 3X6 (MISCELLANEOUS)
DEVICE INFLATION ENCORE 26 (MISCELLANEOUS) IMPLANT
DEVICE LOCKING W-BIOPSY CAP (MISCELLANEOUS) ×3 IMPLANT
FLOOR PAD 36X40 (MISCELLANEOUS) ×3
GUIDEWIRE HYDRA JAGWIRE .35 (WIRE) IMPLANT
GUIDEWIRE JAG HINI 025X260CM (WIRE) ×2 IMPLANT
KIT ENDO PROCEDURE PEN (KITS) ×3 IMPLANT
PAD FLOOR 36X40 (MISCELLANEOUS) IMPLANT
SNARE ROTATE MED OVAL 20MM (MISCELLANEOUS) IMPLANT
SNARE SHORT THROW 13M SML OVAL (MISCELLANEOUS) IMPLANT
SNARE SHORT THROW 27M MED OVAL (MISCELLANEOUS) IMPLANT
SPHINCTEROTOME AUTOTOME .25 (MISCELLANEOUS) ×2 IMPLANT
SPHINCTEROTOME HYDRATOME 44 (MISCELLANEOUS) ×3 IMPLANT
STENT RX PRELOADED 10FRX7CM ×2 IMPLANT
SYR 20CC LL (SYRINGE) IMPLANT
SYR INFLATION 60ML (SYRINGE) ×2 IMPLANT
SYSTEM CONTINUOUS INJECTION (MISCELLANEOUS) ×3 IMPLANT
TIP RX DIL BLN 6-4X5.8X180 (BALLOONS) ×3
WALLSTENT METAL COVERED 10X60 (STENTS) IMPLANT
WALLSTENT METAL COVERED 10X80 (STENTS) IMPLANT
WATER STERILE IRR 1000ML POUR (IV SOLUTION) ×2 IMPLANT

## 2015-04-23 NOTE — Interval H&P Note (Signed)
History and Physical Interval Note:  04/23/2015 12:20 PM  Veronica Stevenson  has presented today for surgery, with the diagnosis of pancreatic mass, obstructive jaundice  The various methods of treatment have been discussed with the patient and family. After consideration of risks, benefits and other options for treatment, the patient has consented to  Procedure(s) with comments: ENDOSCOPIC RETROGRADE CHOLANGIOPANCREATOGRAPHY (ERCP) (N/A) - 1115 SPHINCTEROTOMY (N/A) BILIARY STENT PLACEMENT (N/A) as a surgical intervention .  The patient's history has been reviewed, patient examined, no change in status, stable for surgery.  I have reviewed the patient's chart and labs.  Questions were answered to the patient's satisfaction.     Robert Rourk  No change. Obstructing jaundice secondary to pancreatic head mass.  I explained biliary decompression at length with the patient. I discussed ERCP with sphincterotomy and stent placement at some length. A plastic stent would necessitate subsequent procedure. I Specifically discussed risk of pancreatitis (1 in 10 chance),bleeding and perforation risk. Potential for failed procedure also reviewed. Her questions were answered and she is agreeable.  .   The risks, benefits, limitations, alternatives, and mponderable have been reviewed with the patient. I specifically discussed a1 in 10 chance of pancreatitis, reaction to medications, bleeding, perforation and the possibility of a failed ERCP. Potential for sphincterotomy and stent placement also reviewed. Questions have been answered. All parties agreeable.

## 2015-04-23 NOTE — Anesthesia Preprocedure Evaluation (Signed)
Anesthesia Evaluation  Patient identified by MRN, date of birth, ID band Patient awake    Reviewed: Allergy & Precautions, NPO status , Patient's Chart, lab work & pertinent test results, reviewed documented beta blocker date and time   Airway Mallampati: I  TM Distance: >3 FB     Dental  (+) Teeth Intact, Partial Upper, Dental Advisory Given   Pulmonary neg pulmonary ROS,  breath sounds clear to auscultation        Cardiovascular hypertension, Pt. on medications and Pt. on home beta blockers + Peripheral Vascular Disease Rhythm:Regular Rate:Normal     Neuro/Psych    GI/Hepatic PUD, GERD-  Controlled,  Endo/Other  diabetes, Well Controlled, Type 2, Oral Hypoglycemic Agents  Renal/GU      Musculoskeletal   Abdominal   Peds  Hematology   Anesthesia Other Findings   Reproductive/Obstetrics                             Anesthesia Physical Anesthesia Plan  ASA: III  Anesthesia Plan: General   Post-op Pain Management:    Induction: Intravenous  Airway Management Planned: Oral ETT  Additional Equipment:   Intra-op Plan:   Post-operative Plan: Extubation in OR  Informed Consent: I have reviewed the patients History and Physical, chart, labs and discussed the procedure including the risks, benefits and alternatives for the proposed anesthesia with the patient or authorized representative who has indicated his/her understanding and acceptance.     Plan Discussed with:   Anesthesia Plan Comments:         Anesthesia Quick Evaluation

## 2015-04-23 NOTE — Procedures (Signed)
ERCP PROCEDURE REPORT  PATIENT:  Veronica Stevenson  MR#:  [id number] Birthdate:  05/29/1933, 81 y.o., female Endoscopist:  R. Garfield Cornea, MD FACP [facility] Referred By:  Dr. Rayne Du ref. provider found Procedure Date: 04/23/2015  Procedure:   ERCP with biliary sphincterotomy, bile duct stricture balloon dilation, cytology brushing and stent placement   Indications:  81 year old lady presenting with obstructing jaundice.  Intermittently elevated serum lipase over several months; she's had multiple imaging studies only recently she was noted to have marked dilation of the biliary tree on MRCP but normal LFTs.  In short interim, she underwent an EUS which demonstrated a 3 cm pancreatic head mass without obvious local invasion. Because of anatomical factors, Dr. Ardis Hughs was unable to perform FNA. Her bilirubin recently was determined to be in the 10 range. She comes for ERCP with biliary stricture cytology brushing and decompression via stent placement. CA-19-9 elevated in the 700 range.  This approach has been discussed placed at length risks benefits and limitations on her arms have been reviewed. Please see the history and physical examination for more information.             Informed Consent:  The risks, benefits, limitations, alternatives, and imponderable have been reviewed with the patient. I specifically discussed a 1 in 10 chance of pancreatitis, reaction to medications, bleeding, perforation and the possibility of a failed ERCP. Potential for sphincterotomy and stent placement also reviewed. Questions have been answered. All parties agreeable.  Please see history & physical in medical record for more information.  Medications:  General endotracheal anesthesia was induced by Duwayne Heck and associates.  Please see anesthesia record for complete details.  Instrument: Pentax video chip system duodenoscope.   Procedure performed in the OR. The patient was placed under anesthesia, intubated, and turned  into semipermanent position. Therapeutic Pentax video duodenoscope passed through the oropharynx without any difficulty into the esophagus, stomach, and across the pylorus down into the second portion of the duodenum.   Findings: Cursory examination of distal esophagus and stomach revealed some redundant somewhat" "corkscrewed" gastric cavity. The pyloric channel was traversed with side-viewing scope Examination of the bulb and second portion revealed normal appearing mucosa. The ampulla was identified on the medial wall of the second portion of the duodenum. It was somewhat protuberant with a prominent intramural segment. Given the abnormal gastric anatomy, I had a difficult time obtaining the short position and for good bit of the procedure was in a semi-long position.  A scout film was taken. Utilizing the Microvasive 44 Autotome, the ampulla was approached using guidewire palpation; although I felt approach was good using guidewire palpation,I was unable to achieve either biliary or pancreatic duct cannulation. I went to the smaller 0.25 sphincterotromre and smaller guidewire. With this set up, I was also was unable to achieve biliary cannulation. Dr. Laural Golden was kind enough to come in and observe. He then took the scope and went back  to 44 autotome and an 0.035 guidewire approach the ampullary in similar fashion. He was able to achieve deep biliary cannulation. Subsequently, a cholangiogram was performed revealing marked dilation of the upstream biliary tree and intrahepatic segments there appeared to be a 3-4 cm tight stricture distally.  The sphincterotome was pulled back across the ampulla orifice and a 1 cm sphincterotomy was performed at the 12:00 position using the Erbe unit. This was done without difficulty or apparent complication. Subsequently a 4 mm x 6 cm Hurricaine dilating balloon was railed across the biliary stricture  and inflated to 11 atm for 1 minute it was then taken down. Subsequently,  the cytology brush was pulled across the stricture. Cytology specimen was sent to the lab.  Finally, a 10 [place], 7 cm stent was deployed across the stricture in good position. There was excellent drainage of bile seen with deployment. The pancreatic duct was never manipulated/injected.    Pancreatic duct was not injected or manipulated.   The patient tolerated the procedure well.   Impression:  Obstructing jaundice secondary to pancreatic head mass producing a 3-4 cm distal biliary stricture. Status post biliary sphincterotomy, balloon dilation of the stricture cytology brushing and stent placement   Recommendations:  Continue Levaquin 5 more days. Expedited appointment with Dr. Ancil Linsey in our oncology department. Hopefully, we will have a positive yield on cytology. If not, we may need to pursue cross-sectional imaging guided FNA of the pancreatic head lesion although this lesion is in all likelihood an adenocarcinoma given presentation.  Manus Rudd  04/23/2015  3:02 PM  CC: Dr. Ahmed Prima, Laurita Quint, NP & Dr. Rayne Du ref. provider found

## 2015-04-23 NOTE — Anesthesia Procedure Notes (Signed)
Procedure Name: Intubation Date/Time: 04/23/2015 12:43 PM Performed by: Tressie Stalker E Pre-anesthesia Checklist: Patient identified, Patient being monitored, Timeout performed, Emergency Drugs available and Suction available Patient Re-evaluated:Patient Re-evaluated prior to inductionOxygen Delivery Method: Circle System Utilized Preoxygenation: Pre-oxygenation with 100% oxygen Intubation Type: IV induction Ventilation: Mask ventilation without difficulty Laryngoscope Size: Mac and 3 Grade View: Grade I Tube type: Oral Tube size: 7.0 mm Number of attempts: 1 Airway Equipment and Method: Stylet Placement Confirmation: ETT inserted through vocal cords under direct vision,  positive ETCO2 and breath sounds checked- equal and bilateral Secured at: 21 cm Tube secured with: Tape Dental Injury: Teeth and Oropharynx as per pre-operative assessment

## 2015-04-23 NOTE — Anesthesia Postprocedure Evaluation (Signed)
  Anesthesia Post-op Note  Patient: Veronica Stevenson  Procedure(s) Performed: Procedure(s): ENDOSCOPIC RETROGRADE CHOLANGIOPANCREATOGRAPHY (ERCP), BALLOON DILATION, BRUSHINGS FOR CYTOLOGY (N/A) SPHINCTEROTOMY (N/A) BILIARY STENT PLACEMENT (N/A)  Patient Location: PACU  Anesthesia Type:General  Level of Consciousness: awake, alert , oriented and patient cooperative  Airway and Oxygen Therapy: Patient Spontanous Breathing  Post-op Pain: none  Post-op Assessment: Post-op Vital signs reviewed, Patient's Cardiovascular Status Stable, Respiratory Function Stable, Patent Airway, No signs of Nausea or vomiting and Pain level controlled              Post-op Vital Signs: Reviewed and stable  Last Vitals:  Filed Vitals:   04/23/15 1515  BP: 124/62  Pulse: 70  Temp:   Resp: 13    Complications: No apparent anesthesia complications

## 2015-04-23 NOTE — H&P (View-Only) (Signed)
Primary Care Physician:  [name], NP  Primary Gastroenterologist:  Garfield Cornea, MD   Chief Complaint  Patient presents with  . Follow-up    ERCP    HPI:  Veronica Stevenson is a 81 y.o. female here to schedule ERCP with stenting for obstructing pancreatic head mass.  Patient initially seen by our practice in April 2016 for follow-up of admission for pancreatitis. She was managed by the hospitalist during this hospitalization. She reports her first episode of pancreatitis around January or February of this year at which time she was seen in the emergency department at [facility].   Prior studies as outlined below. -CT of the abdomen and pelvis with contrast January 2016 at [facility] Noted to have pancreatic duct mildly dilated, pancreatic head appeared atrophic. No pancreatic head mass seen and no biliary ductal dilation. -Right upper quadrant abdominal ultrasound 12/2014 Unremarkable, no comment on pancreas.  Since seen by our practice she underwent MRI abdomen/MRCP 02/10/2015 which showed mild intrahepatic and extrahepatic biliary dilation, CBD 9.5 mm. Significant dilation of the main pancreatic duct to the maximum of 8 mm. Side branches also dilated. Mild associated pancreatic atrophy. Cystic changes noted in the pancreatic uncinate process. No obvious obstructing pancreatic head mass or ampullary lesion.   Subsequently saw Dr. Ardis Hughs for endoscopic ultrasound on 04/17/2015 which showed a 1.7 x 2.5 cm mass in the head of the pancreas causing dilation of the pancreatic duct and common bile duct. Suspicious for malignancy. No obvious major vessel involvement. Tortuous distal stomach, duodenal bulb precluded FNA attempt.  -CT chest/abdomen/pelvis with without contrast 04/18/2015 Severe intra-and extra hepatic biliary dilation as well as pancreatic ductal dilation secondary to 3.0 x 2.6 cm pancreatic head mass.  Patient presents today with recent onset jaundice for  about one week. Labs 5 days ago showed a total bilirubin of 10.4, alkaline phosphatase 581, AST 311, ALT 264, albumin 3.5. LFTs were normal on 01/23/2015. CA-19-9 701. Intermittent abdominal pain (RUQ/epigastric). Sometimes can be severe. Using Tylenol when necessary. Bowel movements regular, clay appearing. No blood in the stool. No heartburn. Is able to eat and drink and stay hydrated at this point. Denies any fever or chills.   Diagnosed with DM 02/2015.   Current Outpatient Prescriptions  Medication Sig Dispense Refill  . acetaminophen (TYLENOL) 500 MG tablet Take 500 mg by mouth every 6 (six) hours as needed for mild pain or moderate pain.    Marland Kitchen aspirin EC 81 MG tablet Take 81 mg by mouth every morning.    Marland Kitchen atorvastatin (LIPITOR) 10 MG tablet Take 10 mg by mouth daily after lunch.     . Cholecalciferol (VITAMIN D) 2000 UNITS CAPS Take 1 capsule by mouth every morning.     Marland Kitchen losartan (COZAAR) 50 MG tablet Take 50 mg by mouth every morning.     . metFORMIN (GLUCOPHAGE) 500 MG tablet Take 500 mg by mouth daily after lunch.     . metoprolol succinate (TOPROL-XL) 25 MG 24 hr tablet Take 25 mg by mouth every evening.      No current facility-administered medications for this visit.    Allergies as of 04/22/2015 - Review Complete 04/17/2015  Allergen Reaction Noted  . Codeine Itching 03/26/2012  . Omeprazole Nausea And Vomiting 02/25/2015  . Penicillins Other (See Comments) 03/26/2012    Past Medical History  Diagnosis Date  . Essential hypertension   . Hyperlipidemia   . Asthmatic bronchitis   . Chronic pancreatitis   . Osteoarthritis  of knee   . Atypical ductal hyperplasia of right breast   . Vitamin D deficiency   . GERD (gastroesophageal reflux disease)   . Hyperparathyroidism   . Zollinger-Ellison syndrome   . Carotid artery disease     Past Surgical History  Procedure Laterality Date  . Colonoscopy  2009    [place]  . Tonsillectomy    . Cataract extraction, bilateral  Bilateral   . Parathyroidectomy    . Ovarian cyst removal    . Eus N/A 04/17/2015    Jacobs-1.7 cm x 2.5 cm mass in head of pancreas causing dilation of pancreatic duct and common bile duct.    Family History  Problem Relation Age of Onset  . CAD Mother   . Stroke Father   . Stroke Brother   . Breast cancer Sister     History   Social History  . Marital Status: Single    Spouse Name: N/A  . Number of Children: N/A  . Years of Education: N/A   Occupational History  . Not on file.   Social History Main Topics  . Smoking status: Never Smoker   . Smokeless tobacco: Never Used  . Alcohol Use: No  . Drug Use: No  . Sexual Activity: Not Currently   Other Topics Concern  . Not on file   Social History Narrative      ROS:  General: Negative for anorexia,  fever, chills, fatigue, weakness. 20 pound weight loss this year. 7 pound in last week. Eyes: Negative for vision changes.  ENT: Negative for hoarseness, difficulty swallowing , nasal congestion. CV: Negative for chest pain, angina, palpitations, dyspnea on exertion, peripheral edema.  Respiratory: Negative for dyspnea at rest, dyspnea on exertion, cough, sputum, wheezing.  GI: See history of present illness. GU:  Negative for dysuria, hematuria, urinary incontinence, urinary frequency, nocturnal urination.  MS: Negative for joint pain, low back pain.  Derm: Negative for rash or itching.  Neuro: Negative for weakness, abnormal sensation, seizure, frequent headaches, memory loss, confusion.  Psych: Negative for anxiety, depression, suicidal ideation, hallucinations.  Endo: see general  Heme: Negative for bruising or bleeding. Allergy: Negative for rash or hives.    Physical Examination:  BP 138/77 mmHg  Pulse 78  Temp(Src) 98.2 F (36.8 C) (Oral)  Ht 5\' 4"  (1.626 m)  Wt 119 lb 12.8 oz (54.341 kg)  BMI 20.55 kg/m2   General: Well-nourished, well-developed in no acute distress.  Head: Normocephalic, atraumatic.    Eyes: Conjunctiva pink, no icterus. Mouth: Oropharyngeal mucosa moist and pink , no lesions erythema or exudate. Neck: Supple without thyromegaly, masses, or lymphadenopathy.  Lungs: Clear to auscultation bilaterally.  Heart: Regular rate and rhythm, no murmurs rubs or gallops.  Abdomen: Bowel sounds are normal, mild epigastric/ruq tenderness, nondistended, no hepatosplenomegaly or masses, no abdominal bruits or    hernia , no rebound or guarding.   Rectal: not performed Extremities: No lower extremity edema. No clubbing or deformities.  Neuro: Alert and oriented x 4 , grossly normal neurologically.  Skin: Warm and dry, no rash or jaundice.   Psych: Alert and cooperative, normal mood and affect.  Labs: Lab Results  Component Value Date   WBC 5.2 04/17/2015   HGB 12.9 04/17/2015   HCT 37.3 04/17/2015   MCV 83.3 04/17/2015   PLT 260 04/17/2015   Lab Results  Component Value Date   CREATININE 0.80 04/17/2015   BUN 14 04/17/2015   NA 136 04/17/2015   K 4.0 04/17/2015  CL 103 04/17/2015   CO2 24 04/17/2015   Lab Results  Component Value Date   ALT 264* 04/17/2015   AST 311* 04/17/2015   ALKPHOS 581* 04/17/2015   BILITOT 10.4* 04/17/2015   No results found for: INR, PROTIME   Imaging Studies: Ct Abdomen Pelvis W Wo Contrast  04/18/2015   CLINICAL DATA:  Periumbilical abdominal pain for 2 weeks. Pancreatic mass seen on endoscopy.  EXAM: CT CHEST WITH CONTRAST  CT ABDOMEN AND PELVIS WITH AND WITHOUT CONTRAST  TECHNIQUE: Multidetector CT imaging of the chest was performed during intravenous contrast administration. Multidetector CT imaging of the abdomen and pelvis was performed following the standard protocol before and during bolus administration of intravenous contrast.  CONTRAST:  174mL OMNIPAQUE IOHEXOL 300 MG/ML  SOLN  COMPARISON:  CT scan of chest of July 27, 2006. CT scan of abdomen pelvis of October 31, 2014.  FINDINGS: CT CHEST FINDINGS  No pneumothorax or pleural  effusion is noted. No acute pulmonary disease is noted. There is no evidence of thoracic aortic dissection or aneurysm. Pulmonary arteries appear normal. No mediastinal mass or adenopathy is noted. No significant osseous abnormality is noted in the chest.  CT ABDOMEN AND PELVIS FINDINGS  No gallstones are noted. Gallbladder is distended. Severe intrahepatic and extrahepatic biliary dilatation is noted, as well as pancreatic ductal dilatation, secondary to 3.0 x 2.6 cm pancreatic head mass. The spleen appears normal. Adrenal glands appear normal. No hydronephrosis or renal obstruction is noted. Stable left renal cyst is noted. Atherosclerosis of abdominal aorta is noted without aneurysm formation. There is no evidence of bowel obstruction. Diverticulosis of descending colon is noted without inflammation. The appendix appears normal. No abnormal fluid collection is noted. Uterus and ovaries appear normal. Urinary bladder appears normal. No significant adenopathy is noted. Multilevel degenerative disc disease is noted in the lower lumbar spine.  IMPRESSION: No significant abnormality seen in the chest.  Severe intrahepatic and extrahepatic biliary ductal and pancreatic ductal dilatation is noted secondary to 3 cm pancreatic head mass consistent with malignancy.   Electronically Signed   By: Marijo Conception, M.D.   On: 04/18/2015 19:17   Ct Chest W Contrast  04/18/2015   CLINICAL DATA:  Periumbilical abdominal pain for 2 weeks. Pancreatic mass seen on endoscopy.  EXAM: CT CHEST WITH CONTRAST  CT ABDOMEN AND PELVIS WITH AND WITHOUT CONTRAST  TECHNIQUE: Multidetector CT imaging of the chest was performed during intravenous contrast administration. Multidetector CT imaging of the abdomen and pelvis was performed following the standard protocol before and during bolus administration of intravenous contrast.  CONTRAST:  191mL OMNIPAQUE IOHEXOL 300 MG/ML  SOLN  COMPARISON:  CT scan of chest of July 27, 2006. CT scan of  abdomen pelvis of October 31, 2014.  FINDINGS: CT CHEST FINDINGS  No pneumothorax or pleural effusion is noted. No acute pulmonary disease is noted. There is no evidence of thoracic aortic dissection or aneurysm. Pulmonary arteries appear normal. No mediastinal mass or adenopathy is noted. No significant osseous abnormality is noted in the chest.  CT ABDOMEN AND PELVIS FINDINGS  No gallstones are noted. Gallbladder is distended. Severe intrahepatic and extrahepatic biliary dilatation is noted, as well as pancreatic ductal dilatation, secondary to 3.0 x 2.6 cm pancreatic head mass. The spleen appears normal. Adrenal glands appear normal. No hydronephrosis or renal obstruction is noted. Stable left renal cyst is noted. Atherosclerosis of abdominal aorta is noted without aneurysm formation. There is no evidence of bowel obstruction. Diverticulosis of  descending colon is noted without inflammation. The appendix appears normal. No abnormal fluid collection is noted. Uterus and ovaries appear normal. Urinary bladder appears normal. No significant adenopathy is noted. Multilevel degenerative disc disease is noted in the lower lumbar spine.  IMPRESSION: No significant abnormality seen in the chest.  Severe intrahepatic and extrahepatic biliary ductal and pancreatic ductal dilatation is noted secondary to 3 cm pancreatic head mass consistent with malignancy.   Electronically Signed   By: Marijo Conception, M.D.   On: 04/18/2015 19:17

## 2015-04-23 NOTE — Discharge Instructions (Addendum)
Endoscopic Retrograde Cholangiopancreatography (ERCP), Care After Refer to this sheet in the next few weeks. These instructions provide you with information on caring for yourself after your procedure. Your health care provider may also give you more specific instructions. Your treatment has been planned according to current medical practices, but problems sometimes occur. Call your health care provider if you have any problems or questions after your procedure.  WHAT TO EXPECT AFTER THE PROCEDURE  After your procedure, it is typical to feel:   Soreness in your throat.   Sick to your stomach (nauseous).   Bloated.  Dizzy.   Fatigued. HOME CARE INSTRUCTIONS  Have a friend or family member stay with you for the first 24 hours after your procedure.  Start taking your usual medicines and eating normally as soon as you feel well enough to do so or as directed by your health care provider. SEEK MEDICAL CARE IF:  You have abdominal pain.   You develop signs of infection, such as:   Chills.   Feeling unwell.  SEEK IMMEDIATE MEDICAL CARE IF:  You have difficulty swallowing.  You have worsening throat, chest, or abdominal pain.  You vomit.  You have bloody or very black stools.  You have a fever. Document Released: 07/18/2013 Document Reviewed: 07/18/2013 [facility] Patient Information 2015 [place], [place]. This information is not intended to replace advice given to you by your health care provider. Make sure you discuss any questions you have with your health care provider.  Advance diet as tolerated over the next 24 hours.  Ms. Winker, I was able to place a stent across your bile duct blockage. I was also able to take samples in the hopes of figuring this process out. You should see Dr. Whitney Muse in our oncology department next week. She is on the fourth floor.  Please arrange a hepatic profile on July 15   Take Levaquin 500 milligrams daily for 5 more days and then stop

## 2015-04-23 NOTE — Transfer of Care (Signed)
Immediate Anesthesia Transfer of Care Note  Patient: JERZEY KOMPERDA  Procedure(s) Performed: Procedure(s): ENDOSCOPIC RETROGRADE CHOLANGIOPANCREATOGRAPHY (ERCP), BALLOON DILATION (N/A) SPHINCTEROTOMY (N/A) BILIARY STENT PLACEMENT (N/A)  Patient Location: PACU  Anesthesia Type:General  Level of Consciousness: awake, alert , oriented and patient cooperative  Airway & Oxygen Therapy: Patient Spontanous Breathing and Patient connected to face mask oxygen  Post-op Assessment: Report given to RN and Post -op Vital signs reviewed and stable  Post vital signs: Reviewed and stable  Last Vitals:  Filed Vitals:   04/23/15 1225  BP: 133/68  Pulse:   Temp:   Resp: 20    Complications: No apparent anesthesia complications

## 2015-04-24 ENCOUNTER — Encounter (HOSPITAL_COMMUNITY): Payer: Self-pay | Admitting: Internal Medicine

## 2015-04-24 NOTE — Anesthesia Postprocedure Evaluation (Signed)
  Anesthesia Post-op Note  Patient: Veronica Stevenson  Procedure(s) Performed: Procedure(s): ENDOSCOPIC RETROGRADE CHOLANGIOPANCREATOGRAPHY (ERCP), BALLOON DILATION, BRUSHINGS FOR CYTOLOGY (N/A) SPHINCTEROTOMY (N/A) BILIARY STENT PLACEMENT (N/A)  Patient Location: PACU  Anesthesia Type:General  Level of Consciousness: awake, alert , oriented and patient cooperative  Airway and Oxygen Therapy: Patient Spontanous Breathing  Post-op Pain: none  Post-op Assessment: Post-op Vital signs reviewed, Patient's Cardiovascular Status Stable, Respiratory Function Stable, Patent Airway, No signs of Nausea or vomiting and Pain level controlled              Post-op Vital Signs: Reviewed and stable  Last Vitals:  Filed Vitals:   04/23/15 1543  BP: 140/60  Pulse: 67  Temp: 36.6 C  Resp: 20    Complications: No apparent anesthesia complications

## 2015-04-24 NOTE — Addendum Note (Signed)
Addendum  created 04/24/15 1257 by Charmaine Downs, CRNA   Modules edited: Notes Section   Notes Section:  File: [id number]

## 2015-04-25 ENCOUNTER — Encounter (HOSPITAL_COMMUNITY): Payer: Self-pay | Admitting: Oncology

## 2015-04-25 ENCOUNTER — Encounter: Payer: Self-pay | Admitting: Internal Medicine

## 2015-04-25 ENCOUNTER — Encounter (HOSPITAL_COMMUNITY): Payer: Medicare Other | Attending: Oncology | Admitting: Oncology

## 2015-04-25 VITALS — BP 145/57 | HR 73 | Temp 98.4°F | Resp 18 | Ht 64.0 in | Wt 120.2 lb

## 2015-04-25 DIAGNOSIS — R17 Unspecified jaundice: Secondary | ICD-10-CM | POA: Diagnosis not present

## 2015-04-25 DIAGNOSIS — K869 Disease of pancreas, unspecified: Secondary | ICD-10-CM

## 2015-04-25 DIAGNOSIS — R634 Abnormal weight loss: Secondary | ICD-10-CM

## 2015-04-25 DIAGNOSIS — K8689 Other specified diseases of pancreas: Secondary | ICD-10-CM

## 2015-04-25 MED ORDER — HYDROCODONE-ACETAMINOPHEN 5-325 MG PO TABS: 0.5000 | ORAL_TABLET | Freq: Four times a day (QID) | ORAL | Status: DC | PRN

## 2015-04-25 NOTE — Patient Instructions (Signed)
..  [place] at [facility] Discharge Instructions  RECOMMENDATIONS MADE BY THE CONSULTANT AND ANY TEST RESULTS WILL BE SENT TO YOUR REFERRING PHYSICIAN.  Exam per Dr. Whitney Muse and [name] PA-C [facility] and ensure samples given--We will work on getting you a case of ensure  Referral to Dr. Eugenia Pancoast at [facility]  We will see you back a week from Monday  Thank you for choosing [place] at [facility] to provide your oncology and hematology care.  To afford each patient quality time with our provider, please arrive at least 15 minutes before your scheduled appointment time.    You need to re-schedule your appointment should you arrive 10 or more minutes late.  We strive to give you quality time with our providers, and arriving late affects you and other patients whose appointments are after yours.  Also, if you no show three or more times for appointments you may be dismissed from the clinic at the providers discretion.     Again, thank you for choosing [facility].  Our hope is that these requests will decrease the amount of time that you wait before being seen by our physicians.       _____________________________________________________________  Should you have questions after your visit to [facility], please contact our office at [phone] between the hours of 8:30 a.m. and 4:30 p.m.  Voicemails left after 4:30 p.m. will not be returned until the following business day.  For prescription refill requests, have your pharmacy contact our office.

## 2015-04-25 NOTE — Progress Notes (Signed)
[facility] Hematology/Oncology Consultation   Name: Veronica Stevenson      MRN: [id number]    Location: Room/bed info not found  Date: 04/25/2015 Time:1:08 PM   REFERRING PHYSICIAN:  Manus Rudd, MD  REASON FOR CONSULT:  3 cm Pancreatic mass at head of pancreas   DIAGNOSIS:  2.5 x 1.7 cm head of pancreas mass  HISTORY OF PRESENT ILLNESS:   Veronica Stevenson is a 81 year old woman with a past medical history significant for HTN and hypercholesterolemia who is referred to [facility] with pancreatic mass.  In March, the patient was admitted to [facility] with elevated lipase and signs and symptoms of acute pancreatitis.  It is reported that in Feb 2016, she was diagnosed with pancreatitis.  During hospitalization, [place] was performed which did not demonstrate any acute findings.  Following discharge, she was followed by [facility] (GI).  In May 2016, and MRI of abdomen including MRCP was performed due to continued abdominal pain demonstrating marked dilatation of the main pancreatic duct and side branches without obvious pancreatic or ampullary lesion.  She was subsequently referred to Dr. Ardis Hughs who performed an EUS on 04/17/2015.  He reports a 2.5 x 1.7 cm head of pancreas mass causing dilatation of pancreatic duct and common bile duct.  No major vessel involved on exam.  FNA precluded due to tortuous distal stomach and duodenal bulb.  Subsequently a CA 19-9 was performed on 04/17/2015 showing a value of 701.  CT CAP on 04/18/2015 demonstrates a 3 cm pancreatic head mass consistent with malignancy.  On 04/22/2015, she underwent ERCP, sphincterotomy, and biliary stent placement by Dr. Gala Romney on 04/22/2015.  Chart reviewed.  I personally reviewed and went over laboratory results with the patient.  The results are noted within this dictation.  I personally reviewed and went over radiographic studies with the patient.  The results are noted within this dictation.    She reports that she  was jaundiced recently, but that is improving.  She noted orange urine which has resolved since her stent placement on 04/22/2015.    PAST MEDICAL HISTORY:   Past Medical History  Diagnosis Date  . Essential hypertension   . Hyperlipidemia   . Asthmatic bronchitis   . Chronic pancreatitis   . Osteoarthritis of knee   . Atypical ductal hyperplasia of right breast   . Vitamin D deficiency   . GERD (gastroesophageal reflux disease)   . Hyperparathyroidism   . Zollinger-Ellison syndrome   . Carotid artery disease     ALLERGIES: Allergies  Allergen Reactions  . Codeine Itching  . Omeprazole Nausea And Vomiting  . Penicillins Other (See Comments)    "blacked out"      MEDICATIONS: I have reviewed the patient's current medications.    Current Outpatient Prescriptions on File Prior to Visit  Medication Sig Dispense Refill  . aspirin EC 81 MG tablet Take 81 mg by mouth every morning.    Marland Kitchen atorvastatin (LIPITOR) 10 MG tablet Take 10 mg by mouth daily after lunch.     . Cholecalciferol (VITAMIN D) 2000 UNITS CAPS Take 1 capsule by mouth every morning.     Marland Kitchen levofloxacin (LEVAQUIN) 500 MG tablet Take 1 tablet (500 mg total) by mouth daily. 5 tablet 0  . losartan (COZAAR) 50 MG tablet Take 50 mg by mouth every morning.     . metFORMIN (GLUCOPHAGE) 500 MG tablet Take 500 mg by mouth daily after  lunch.     . metoprolol succinate (TOPROL-XL) 25 MG 24 hr tablet Take 25 mg by mouth every evening.     Marland Kitchen acetaminophen (TYLENOL) 500 MG tablet Take 500 mg by mouth every 6 (six) hours as needed for mild pain or moderate pain.     No current facility-administered medications on file prior to visit.     PAST SURGICAL HISTORY Past Surgical History  Procedure Laterality Date  . Colonoscopy  2009    [place]  . Tonsillectomy    . Cataract extraction, bilateral Bilateral   . Parathyroidectomy    . Ovarian cyst removal    . Eus N/A 04/17/2015    Jacobs-1.7 cm x 2.5 cm mass in head of pancreas  causing dilation of pancreatic duct and common bile duct.  . Ercp N/A 04/23/2015    Procedure: ENDOSCOPIC RETROGRADE CHOLANGIOPANCREATOGRAPHY (ERCP), BALLOON DILATION, BRUSHINGS FOR CYTOLOGY;  Surgeon: Corbin Ade, MD;  Location: [facility];  Service: Endoscopy;  Laterality: N/A;  . Sphincterotomy N/A 04/23/2015    Procedure: SPHINCTEROTOMY;  Surgeon: Corbin Ade, MD;  Location: [facility];  Service: Endoscopy;  Laterality: N/A;  . Biliary stent placement N/A 04/23/2015    Procedure: BILIARY STENT PLACEMENT;  Surgeon: Corbin Ade, MD;  Location: [facility];  Service: Endoscopy;  Laterality: N/A;    FAMILY HISTORY: Family History  Problem Relation Age of Onset  . CAD Mother   . Stroke Father   . Stroke Brother   . Breast cancer Sister    She has 1 brother and 1 sister still living.  She is 1 children out of 12 in total.  Her mother and father both passed from a stroke in their 8's.  Oldest sister passed from cancer, unknown type, at the age of 79 yo.  SOCIAL HISTORY:  reports that she has never smoked. She has never used smokeless tobacco. She reports that she does not drink alcohol or use illicit drugs.  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  She is never married.  She has no children.  However, she has nieces and nephews in the area.  She has 1 brother and 1 sister still living.  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  PERFORMANCE STATUS: The patient's performance status is 1 - Symptomatic but completely ambulatory  PHYSICAL EXAM: Most Recent Vital Signs: Blood pressure 145/57, pulse 73, temperature 98.4 F (36.9 C), temperature source Oral, resp. rate 18, height 5\' 4"  (1.626 m), weight 120 lb 3.2 oz (54.522 kg), SpO2 100 %. General appearance: alert, cooperative, appears stated age, icteric and no distress Thin Head: Normocephalic, without obvious abnormality, atraumatic Eyes: positive findings: scleral icterus Throat:  lips, mucosa, and tongue normal; teeth and gums normal Neck: no adenopathy and supple, symmetrical, trachea midline Lungs: clear to auscultation bilaterally and normal percussion bilaterally Heart: regular rate and rhythm, S1, S2 normal, no murmur, click, rub or gallop Abdomen: soft, non-tender; bowel sounds normal; no masses,  no organomegaly Extremities: extremities normal, atraumatic, no cyanosis or edema Skin: Skin color, texture, turgor normal. No rashes or lesions Lymph nodes: Cervical, supraclavicular, and axillary nodes normal. Neurologic: Alert and oriented X 3, normal strength and tone. Normal symmetric reflexes. Normal coordination and gait  LABORATORY DATA:  Results for orders placed or performed during the hospital encounter of 04/23/15 (from the past 48 hour(s))  Glucose, capillary     Status: Abnormal  Collection Time: 04/23/15  3:02 PM  Result Value Ref Range   Glucose-Capillary 131 (H) 65 - 99 mg/dL      RADIOGRAPHY: Dg Ercp Biliary & Pancreatic Ducts  04/23/2015   CLINICAL DATA:  Biliary obstruction and jaundice  EXAM: ERCP  TECHNIQUE: Multiple spot images obtained with the fluoroscopic device and submitted for interpretation post-procedure.  FLUOROSCOPY TIME:  Radiation Exposure Index (as provided by the fluoroscopic device):  If the device does not provide the exposure index:  Fluoroscopy Time:  4 minutes and 22 seconds  Number of Acquired Images:  10  COMPARISON:  None.  FINDINGS: Images document cannulation of the common bile duct and contrast filling the biliary tree. The common bile duct and bili tree is severely narrowed. There is tapering of the distal common bile duct consistent with the patient's obstruction. The final image demonstrates a plastic stent in place. Contrast has cleared from the dilated common bile duct suggesting adequate function of the stent.  IMPRESSION: Biliary stent placement.  These images were submitted for radiologic interpretation only. Please  see the procedural report for the amount of contrast and the fluoroscopy time utilized.   Electronically Signed   By: Marybelle Killings M.D.   On: 04/23/2015 15:04       PATHOLOGY:  None  ASSESSMENT:  1. Head of pancreas mass, measuring 1.7 x 2.4 cm in size on EUS on 04/17/2015 and 3 cm on radiographic imaging on 04/18/2015.  S/P ERCP with biopsy on 04/22/2015 but no pathology noted in [facility] at this time. DIscussed with pathology and brushing non-diagnostic 2. Jaundice 3. Weight loss, 20 lbs since Feb 2016.   PLAN:  1. I personally reviewed and went over laboratory results with the patient.  The results are noted within this dictation. 2. I personally reviewed and went over radiographic studies with the patient.  The results are noted within this dictation.   3. Chart reviewed 4. Dietician referral 5. Samples of ensure 6. Case discussed with Dr. Eugenia Pancoast.  He is on vacation and requested I send him patient information via secure e-mail.  I completed this task and Dr. Eugenia Pancoast was nice enough to have his team work the patient in with one of his colleagues.  [facility] will contact the patient for appointment with Surgical Oncology. 7. Return on 05/05/2015 for follow-up.  Hopefully in the interim she is seen at [facility] for consideration of resection/whipple procedure.  If adjuvant therapy is needed, she can receive systemic chemotherapy at [facility]. .  All questions were answered. The patient knows to call the clinic with any problems, questions or concerns. We can certainly see the patient much sooner if necessary.  Patient and plan discussed with Dr. Ancil Linsey and she is in agreement with the aforementioned.   This note is electronically signed by: Doy Mince  04/25/2015 1:08 PM  As detailed. Patient seen and examined.  I discussed with her that this is most likely a pancreatic carcinoma.  We discussed overall prognosis even in resectable patients.  I advised her that based  upon imaging she needs a surgical consultation, as if she is a candidate for resection this provides for the longest survival.  She is still quite fit in spite of her weight loss. We discussed obtaining a tissue diagnosis and I assured her that this will be addressed at [facility].  She was given the NCCN guidelines on pancreatic cancer and she and I discussed these together in detail. I answered her questions.  We will plan on seeing her back after her consultation.  She has met our patient navigator and was instructed to call with any questions or concerns in the interim.   Molli Hazard, MD

## 2015-04-29 ENCOUNTER — Encounter: Payer: Self-pay | Admitting: Dietician

## 2015-04-29 NOTE — Progress Notes (Signed)
Asked to follow up with patient d/t significant wt loss.   Contacted Pt by Phone  Wt Readings from Last 10 Encounters:  04/25/15 120 lb 3.2 oz (54.522 kg)  04/22/15 119 lb 12.8 oz (54.341 kg)  04/17/15 126 lb (57.153 kg)  03/24/15 126 lb 12.8 oz (57.516 kg)  02/25/15 129 lb (58.514 kg)  01/23/15 135 lb (61.236 kg)  01/08/15 139 lb 8.8 oz (63.3 kg)  07/26/13 142 lb (64.411 kg)  03/26/12 144 lb (65.318 kg)   Patient weight has reportedly lost 20 lbs since February.   Pt just was just recently found to have a pancreatic mass which MD feels is consistent with pancreatic carcinoma. She is to meet with a surgeon at [facility] in 1 week to determine her treatment options.   Patient reports oral intake as fair and is suffering from symptoms including lack/change in taste, nausea, and constipation. She has not yet undergone any treatment and these symptoms would likely be malignancy/medication related.  She believes that because she has recently started takeing so many medications that her body has not adjusted to the changes yet.   She reports snacking throughout the day. She does not really eat large meals. She also is drinking Boost supplements. She was very glad to hear of the Ensure program. I will order a case for her next appointment.  We talked about the importance of maintaining her weight and lean body mass. Educated pt on the importance of protein and to try to eat protein containing foods at each meal. Listed different options she could have at her meals.   Let her know to call or ask to speak with me if she has any questions as she moves forward with treatment.   Pt seemed to be receptive to the information and thankful for the call  Mailed my contact info, coupons, and handouts titled "Constipation", "Soft and Moist High Protein Menu Ideas", and  "Increasing Calories and Protein"  Burtis Junes RD, LDN Nutrition Pager: [phone] 04/29/2015 1:52 PM

## 2015-05-05 ENCOUNTER — Encounter (HOSPITAL_BASED_OUTPATIENT_CLINIC_OR_DEPARTMENT_OTHER): Payer: Medicare Other | Admitting: Hematology & Oncology

## 2015-05-05 ENCOUNTER — Encounter (HOSPITAL_COMMUNITY): Payer: Self-pay | Admitting: Hematology & Oncology

## 2015-05-05 VITALS — BP 146/71 | HR 79 | Temp 98.6°F | Resp 20

## 2015-05-05 DIAGNOSIS — R17 Unspecified jaundice: Secondary | ICD-10-CM

## 2015-05-05 DIAGNOSIS — R1010 Upper abdominal pain, unspecified: Secondary | ICD-10-CM | POA: Diagnosis not present

## 2015-05-05 DIAGNOSIS — K869 Disease of pancreas, unspecified: Secondary | ICD-10-CM

## 2015-05-05 DIAGNOSIS — K8689 Other specified diseases of pancreas: Secondary | ICD-10-CM

## 2015-05-05 DIAGNOSIS — R978 Other abnormal tumor markers: Secondary | ICD-10-CM

## 2015-05-05 MED ORDER — ONDANSETRON HCL 8 MG PO TABS: 8.0000 mg | ORAL_TABLET | Freq: Three times a day (TID) | ORAL | Status: DC | PRN

## 2015-05-05 NOTE — Patient Instructions (Signed)
[place] at [facility] Discharge Instructions  RECOMMENDATIONS MADE BY THE CONSULTANT AND ANY TEST RESULTS WILL BE SENT TO YOUR REFERRING PHYSICIAN.  Exam and discussion by Dr. Whitney Muse. Keep the appointment with Dr. Eugenia Pancoast tomorrow. Let us know if he plans to do surgery and when it is going to be done.  Call Mickie Kay, RN [phone] when you know what the plan is going to be. Will tentatively schedule you to see Dr. Whitney Muse in 1 week.   Thank you for choosing [place] at [facility] to provide your oncology and hematology care.  To afford each patient quality time with our provider, please arrive at least 15 minutes before your scheduled appointment time.    You need to re-schedule your appointment should you arrive 10 or more minutes late.  We strive to give you quality time with our providers, and arriving late affects you and other patients whose appointments are after yours.  Also, if you no show three or more times for appointments you may be dismissed from the clinic at the providers discretion.     Again, thank you for choosing [facility].  Our hope is that these requests will decrease the amount of time that you wait before being seen by our physicians.       _____________________________________________________________  Should you have questions after your visit to [facility], please contact our office at [phone] between the hours of 8:30 a.m. and 4:30 p.m.  Voicemails left after 4:30 p.m. will not be returned until the following business day.  For prescription refill requests, have your pharmacy contact our office.

## 2015-05-05 NOTE — Progress Notes (Signed)
[facility] Hematology/Oncology Consultation   Name: Veronica Stevenson      MRN: [id number]    Location: Room/bed info not found  Date: 05/05/2015 Time:2:57 PM   REFERRING PHYSICIAN:  Manus Rudd, MD  REASON FOR CONSULT:  3 cm Pancreatic mass at head of pancreas   DIAGNOSIS:  2.5 x 1.7 cm head of pancreas mass  HISTORY OF PRESENT ILLNESS:   Veronica Stevenson is a 81 year old woman with a past medical history significant for HTN and hypercholesterolemia who is referred to [facility] with pancreatic mass.  In March, the patient was admitted to [facility] with elevated lipase and signs and symptoms of acute pancreatitis.  It is reported that in Feb 2016, she was diagnosed with pancreatitis.  During hospitalization, [place] was performed which did not demonstrate any acute findings.  Following discharge, she was followed by [facility] (GI).  In May 2016, and MRI of abdomen including MRCP was performed due to continued abdominal pain demonstrating marked dilatation of the main pancreatic duct and side branches without obvious pancreatic or ampullary lesion.  She was subsequently referred to Dr. Ardis Hughs who performed an EUS on 04/17/2015.  He reports a 2.5 x 1.7 cm head of pancreas mass causing dilatation of pancreatic duct and common bile duct.  No major vessel involved on exam.  FNA precluded due to tortuous distal stomach and duodenal bulb.  Subsequently a CA 19-9 was performed on 04/17/2015 showing a value of 701.  CT CAP on 04/18/2015 demonstrates a 3 cm pancreatic head mass consistent with malignancy.  On 04/22/2015, she underwent ERCP, sphincterotomy, and biliary stent placement by Dr. Gala Romney on 04/22/2015.  The patient will have her referral appointment at [facility]. She will see Dr. Eugenia Pancoast. Her nephew is driving her there.  She complains that when she eats she has pain.  This is alleviated with Tums.  Otherwise, she says that she feels well. She tried taking a pain pill but it  made her nauseated. She is willing to try nausea medication.  Her bowels are normal.  Her urine color has cleared up since she has had the stent put in.   PAST MEDICAL HISTORY:   Past Medical History  Diagnosis Date  . Essential hypertension   . Hyperlipidemia   . Asthmatic bronchitis   . Chronic pancreatitis   . Osteoarthritis of knee   . Atypical ductal hyperplasia of right breast   . Vitamin D deficiency   . GERD (gastroesophageal reflux disease)   . Hyperparathyroidism   . Zollinger-Ellison syndrome   . Carotid artery disease     ALLERGIES: Allergies  Allergen Reactions  . Codeine Itching  . Omeprazole Nausea And Vomiting  . Penicillins Other (See Comments)    "blacked out"      MEDICATIONS: I have reviewed the patient's current medications.    Current Outpatient Prescriptions on File Prior to Visit  Medication Sig Dispense Refill  . acetaminophen (TYLENOL) 500 MG tablet Take 500 mg by mouth every 6 (six) hours as needed for mild pain or moderate pain.    Marland Kitchen aspirin EC 81 MG tablet Take 81 mg by mouth every morning.    Marland Kitchen atorvastatin (LIPITOR) 10 MG tablet Take 10 mg by mouth daily after lunch.     . Cholecalciferol (VITAMIN D) 2000 UNITS CAPS Take 1 capsule by mouth every morning.     [name] Calcium (STOOL SOFTENER PO) Take 1 capsule by mouth  daily.    . losartan (COZAAR) 50 MG tablet Take 50 mg by mouth every morning.     . metFORMIN (GLUCOPHAGE) 500 MG tablet Take 500 mg by mouth daily after lunch.     . metoprolol succinate (TOPROL-XL) 25 MG 24 hr tablet Take 25 mg by mouth every evening.     Marland Kitchen HYDROcodone-acetaminophen (NORCO/VICODIN) 5-325 MG per tablet Take 0.5-1 tablets by mouth every 6 (six) hours as needed for moderate pain. (Patient not taking: Reported on 05/05/2015) 45 tablet 0  . levofloxacin (LEVAQUIN) 500 MG tablet Take 1 tablet (500 mg total) by mouth daily. (Patient not taking: Reported on 05/05/2015) 5 tablet 0   No current facility-administered  medications on file prior to visit.     PAST SURGICAL HISTORY Past Surgical History  Procedure Laterality Date  . Colonoscopy  2009    [place]  . Tonsillectomy    . Cataract extraction, bilateral Bilateral   . Parathyroidectomy    . Ovarian cyst removal    . Eus N/A 04/17/2015    Jacobs-1.7 cm x 2.5 cm mass in head of pancreas causing dilation of pancreatic duct and common bile duct.  . Ercp N/A 04/23/2015    Procedure: ENDOSCOPIC RETROGRADE CHOLANGIOPANCREATOGRAPHY (ERCP), BALLOON DILATION, BRUSHINGS FOR CYTOLOGY;  Surgeon: Daneil Dolin, MD;  Location: [facility];  Service: Endoscopy;  Laterality: N/A;  . Sphincterotomy N/A 04/23/2015    Procedure: SPHINCTEROTOMY;  Surgeon: Daneil Dolin, MD;  Location: [facility];  Service: Endoscopy;  Laterality: N/A;  . Biliary stent placement N/A 04/23/2015    Procedure: BILIARY STENT PLACEMENT;  Surgeon: Daneil Dolin, MD;  Location: [facility];  Service: Endoscopy;  Laterality: N/A;    FAMILY HISTORY: Family History  Problem Relation Age of Onset  . CAD Mother   . Stroke Father   . Stroke Brother   . Breast cancer Sister    She has 1 brother and 1 sister still living.  She is 1 children out of 12 in total.  Her mother and father both passed from a stroke in their 58's.  Oldest sister passed from cancer, unknown type, at the age of 79 yo.  SOCIAL HISTORY:  reports that she has never smoked. She has never used smokeless tobacco. She reports that she does not drink alcohol or use illicit drugs.  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  She is never married.  She has no children.  However, she has nieces and nephews in the area.  She has 1 brother and 1 sister still living.  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  PERFORMANCE STATUS: The patient's performance status is 1 - Symptomatic but completely ambulatory   PHYSICAL EXAM: Most Recent Vital Signs: Blood pressure 146/71, pulse  79, temperature 98.6 F (37 C), temperature source Oral, resp. rate 20, SpO2 100 %. General appearance: alert, cooperative, appears stated age, icteric and no distress Thin Head: Normocephalic, without obvious abnormality, atraumatic Eyes: positive findings: scleral icterus Throat: lips, mucosa, and tongue normal; teeth and gums normal Neck: no adenopathy and supple, symmetrical, trachea midline Lungs: clear to auscultation bilaterally and normal percussion bilaterally Heart: regular rate and rhythm, S1, S2 normal, no murmur, click, rub or gallop Abdomen: soft, non-tender; bowel sounds normal; no masses,  no organomegaly Extremities: extremities normal, atraumatic, no cyanosis or edema Skin: Skin color, texture, turgor normal. No rashes or lesions Lymph nodes: Cervical, supraclavicular,  and axillary nodes normal. Neurologic: Alert and oriented X 3, normal strength and tone. Normal symmetric reflexes. Normal coordination and gait  LABORATORY DATA:   CA 19-9 0 - 35 U/mL 701 (H)       PATHOLOGY:  None  ASSESSMENT/PLAN:  1. Head of pancreas mass, measuring 1.7 x 2.4 cm in size on EUS on 04/17/2015 and 3 cm on radiographic imaging on 04/18/2015.  S/P ERCP with biopsy on 04/22/2015 but no pathology noted in [facility] at this time. DIscussed with pathology and brushing non-diagnostic 2. Jaundice 3. Weight loss, 20 lbs since Feb 2016.  We discussed the plan moving forward. She will go to her consultation appointment at [facility]. I advised her that if surgery is recommended and she wishes to proceed to please call our office and we will move her follow-up appointment next week. I advised her that if she needs to have additional chemotherapy we can do that here at home for her.  We reviewed several questions that she had in regards to pancreatic cancer, I did provide her with reading information at her last appointment. She is eating somewhat better since her stent has been placed. I called her in a  prescription for Zofran which she can take prior to her pain medication if needed. I advised another option would be to try different pain medication.  All questions were answered. The patient knows to call the clinic with any problems, questions or concerns. We can certainly see the patient much sooner if necessary.  This note is electronically signed by Molli Hazard, MD

## 2015-05-13 ENCOUNTER — Encounter (HOSPITAL_COMMUNITY): Payer: Self-pay | Admitting: Oncology

## 2015-05-13 ENCOUNTER — Ambulatory Visit (HOSPITAL_COMMUNITY): Payer: Medicare Other | Admitting: Hematology & Oncology

## 2015-05-13 ENCOUNTER — Encounter (HOSPITAL_COMMUNITY): Payer: Medicare Other | Attending: Oncology | Admitting: Oncology

## 2015-05-13 VITALS — BP 139/73 | HR 81 | Temp 98.8°F | Resp 18 | Wt 120.0 lb

## 2015-05-13 DIAGNOSIS — K8689 Other specified diseases of pancreas: Secondary | ICD-10-CM

## 2015-05-13 DIAGNOSIS — K869 Disease of pancreas, unspecified: Secondary | ICD-10-CM | POA: Diagnosis not present

## 2015-05-13 NOTE — Patient Instructions (Signed)
[place] at [facility] Discharge Instructions  RECOMMENDATIONS MADE BY THE CONSULTANT AND ANY TEST RESULTS WILL BE SENT TO YOUR REFERRING PHYSICIAN.  Exam and discussion today with [name], PA-C. Follow-up as planned with Dr. Eugenia Pancoast. Return for office visit as scheduled with Dr. Whitney Muse.  Thank you for choosing [place] at [facility] to provide your oncology and hematology care.  To afford each patient quality time with our provider, please arrive at least 15 minutes before your scheduled appointment time.    You need to re-schedule your appointment should you arrive 10 or more minutes late.  We strive to give you quality time with our providers, and arriving late affects you and other patients whose appointments are after yours.  Also, if you no show three or more times for appointments you may be dismissed from the clinic at the providers discretion.     Again, thank you for choosing [facility].  Our hope is that these requests will decrease the amount of time that you wait before being seen by our physicians.       _____________________________________________________________  Should you have questions after your visit to [facility], please contact our office at [phone] between the hours of 8:30 a.m. and 4:30 p.m.  Voicemails left after 4:30 p.m. will not be returned until the following business day.  For prescription refill requests, have your pharmacy contact our office.

## 2015-05-13 NOTE — Assessment & Plan Note (Addendum)
Head of pancreas mass, measuring 1.7 x 2.4 cm in size on EUS on 04/17/2015 and 3 cm on radiographic imaging on 04/18/2015. S/P ERCP with biopsy on 04/22/2015 but no pathology noted in [facility] at this time.  04/23/2015 brushings were non-diagnostic.  She was seen on 05/06/2015 by Dr. Eugenia Pancoast at [facility].  Information is not yet updated in [place] as of this afternoon.  Since being seen by Dr. Eugenia Pancoast, Dr. Whitney Muse has discussed the case with Dr. Eugenia Pancoast.  She has been deemed a surgical candidate and therefore, she wishes to pursue this option.    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  Guidelines are reviewed and she would be a candidate for adjuvant gemcitabine therapy (single-agent) following surgery for 6 months to reduce the risk of recurrence.  She is not too keen on chemotherapy, but she is agreeable to discuss this further following surgery.  I have contacted Dr. Eugenia Pancoast and he will make arrangements for surgery.  We will see her back in about 3 weeks to review recovery from surgery and discuss adjuvant therapy options.

## 2015-05-13 NOTE — Progress Notes (Signed)
Veronica Rival, NP [address] [place] [address]  Pancreatic mass  CURRENT THERAPY: Planning of treatment  INTERVAL HISTORY: Veronica Stevenson 81 y.o. female returns for followup of Head of pancreas mass, measuring 1.7 x 2.4 cm in size on EUS on 04/17/2015 and 3 cm on radiographic imaging on 04/18/2015. S/P ERCP with biopsy on 04/22/2015 but no pathology noted in [facility] at this time. Discussed with pathology and brushing non-diagnostic.  The patient has seen Dr. Eugenia Pancoast and she is deemed a surgical candidate.  She returns today to verify that her social issues have been figured out/resolved.  She is a caregiver, and her patient has made arrangements for her upcoming surgery and recovery.  Additionally, she is prepared for SNF following surgery if necessary but she is hopeful that she can be discharged from [facility] with home health at her sister's house.    I broached the subject of systemic chemotherapy with single-agent gemcitabine following whipple to reduce the risk of recurrence, but she was not too keen on this subject.  As a result, we will discuss this further following surgery.  She is prepped for this treatment option.  She is not opposed to it, just was hopeful that she would not need it.  I will go over it in more detail when she returns as I think she is experiencing some information overload at this time.    Past Medical History  Diagnosis Date  . Essential hypertension   . Hyperlipidemia   . Asthmatic bronchitis   . Chronic pancreatitis   . Osteoarthritis of knee   . Atypical ductal hyperplasia of right breast   . Vitamin D deficiency   . GERD (gastroesophageal reflux disease)   . Hyperparathyroidism   . Zollinger-Ellison syndrome   . Carotid artery disease     has Hypertension; Hypercholesteremia; Acute pancreatitis; HLD (hyperlipidemia); Essential hypertension; Hyperglycemia; Anemia; Dyspepsia; Elevated lipase; Abnormal magnetic resonance  cholangiopancreatography (MRCP); Loss of weight; Abdominal pain; Obstructive jaundice; and Pancreatic mass on her problem list.     is allergic to codeine; omeprazole; and penicillins.  Current Outpatient Prescriptions on File Prior to Visit  Medication Sig Dispense Refill  . acetaminophen (TYLENOL) 500 MG tablet Take 500 mg by mouth every 6 (six) hours as needed for mild pain or moderate pain.    Marland Kitchen aspirin EC 81 MG tablet Take 81 mg by mouth every morning.    Marland Kitchen atorvastatin (LIPITOR) 10 MG tablet Take 10 mg by mouth daily after lunch.     . Cholecalciferol (VITAMIN D) 2000 UNITS CAPS Take 1 capsule by mouth every morning.     [name] Calcium (STOOL SOFTENER PO) Take 1 capsule by mouth daily.    Marland Kitchen HYDROcodone-acetaminophen (NORCO/VICODIN) 5-325 MG per tablet Take 0.5-1 tablets by mouth every 6 (six) hours as needed for moderate pain. (Patient not taking: Reported on 05/05/2015) 45 tablet 0  . losartan (COZAAR) 50 MG tablet Take 50 mg by mouth every morning.     . metFORMIN (GLUCOPHAGE) 500 MG tablet Take 500 mg by mouth daily after lunch.     . metoprolol succinate (TOPROL-XL) 25 MG 24 hr tablet Take 25 mg by mouth every evening.     . ondansetron (ZOFRAN) 8 MG tablet Take 1 tablet (8 mg total) by mouth every 8 (eight) hours as needed for nausea or vomiting. 20 tablet 0   No current facility-administered medications on file prior to visit.    Past  Surgical History  Procedure Laterality Date  . Colonoscopy  2009    [place]  . Tonsillectomy    . Cataract extraction, bilateral Bilateral   . Parathyroidectomy    . Ovarian cyst removal    . Eus N/A 04/17/2015    Jacobs-1.7 cm x 2.5 cm mass in head of pancreas causing dilation of pancreatic duct and common bile duct.  . Ercp N/A 04/23/2015    Procedure: ENDOSCOPIC RETROGRADE CHOLANGIOPANCREATOGRAPHY (ERCP), BALLOON DILATION, BRUSHINGS FOR CYTOLOGY;  Surgeon: Daneil Dolin, MD;  Location: [facility];  Service: Endoscopy;  Laterality: N/A;  .  Sphincterotomy N/A 04/23/2015    Procedure: SPHINCTEROTOMY;  Surgeon: Daneil Dolin, MD;  Location: [facility];  Service: Endoscopy;  Laterality: N/A;  . Biliary stent placement N/A 04/23/2015    Procedure: BILIARY STENT PLACEMENT;  Surgeon: Daneil Dolin, MD;  Location: [facility];  Service: Endoscopy;  Laterality: N/A;    Denies any headaches, dizziness, double vision, fevers, chills, night sweats, nausea, vomiting, diarrhea, constipation, chest pain, heart palpitations, shortness of breath, blood in stool, black tarry stool, urinary pain, urinary burning, urinary frequency, hematuria.   PHYSICAL EXAMINATION  ECOG PERFORMANCE STATUS: 0 - Asymptomatic  Filed Vitals:   05/13/15 1249  BP: 139/73  Pulse: 81  Temp: 98.8 F (37.1 C)  Resp: 18    GENERAL:alert, no distress, well nourished, well developed, comfortable, cooperative and smiling SKIN: skin color, texture, turgor are normal, no rashes or significant lesions HEAD: Normocephalic, No masses, lesions, tenderness or abnormalities EYES: normal, PERRLA, EOMI, Conjunctiva are pink and non-injected EARS: External ears normal OROPHARYNX:lips, buccal mucosa, and tongue normal and mucous membranes are moist  NECK: supple, trachea midline LYMPH:  no palpable lymphadenopathy BREAST:not examined LUNGS: clear to auscultation  HEART: regular rate & rhythm, no murmurs and no gallops ABDOMEN:abdomen soft and normal bowel sounds BACK: Back symmetric, no curvature. EXTREMITIES:less then 2 second capillary refill, no joint deformities, effusion, or inflammation, no skin discoloration, no cyanosis  NEURO: alert & oriented x 3 with fluent speech, no focal motor/sensory deficits, gait normal   LABORATORY DATA: CBC    Component Value Date/Time   WBC 5.2 04/17/2015 0857   WBC 7.9 10/31/2014 1347   RBC 4.48 04/17/2015 0857   RBC 4.17 10/31/2014 1347   HGB 12.9 04/17/2015 0857   HGB 12.2 10/31/2014 1347   HCT 37.3 04/17/2015 0857   HCT 38.4  10/31/2014 1347   PLT 260 04/17/2015 0857   PLT 218 10/31/2014 1347   MCV 83.3 04/17/2015 0857   MCV 92 10/31/2014 1347   MCH 28.8 04/17/2015 0857   MCH 29.2 10/31/2014 1347   MCHC 34.6 04/17/2015 0857   MCHC 31.8* 10/31/2014 1347   RDW 16.1* 04/17/2015 0857   RDW 13.8 10/31/2014 1347   LYMPHSABS 1.1 04/17/2015 0857   LYMPHSABS 2.4 10/31/2014 1347   MONOABS 0.4 04/17/2015 0857   MONOABS 0.6 10/31/2014 1347   EOSABS 0.0 04/17/2015 0857   EOSABS 0.1 10/31/2014 1347   BASOSABS 0.0 04/17/2015 0857   BASOSABS 0.0 10/31/2014 1347      Chemistry      Component Value Date/Time   NA 136 04/17/2015 0857   NA 142 10/31/2014 1347   K 4.0 04/17/2015 0857   K 3.5 10/31/2014 1347   CL 103 04/17/2015 0857   CL 108* 10/31/2014 1347   CO2 24 04/17/2015 0857   CO2 26 10/31/2014 1347   BUN 14 04/17/2015 0857   BUN 19* 10/31/2014 1347  CREATININE 0.80 04/17/2015 0857   CREATININE 0.95 01/23/2015 1452   CREATININE 1.10 10/31/2014 1347      Component Value Date/Time   CALCIUM 9.4 04/17/2015 0857   CALCIUM 9.3 10/31/2014 1347   ALKPHOS 581* 04/17/2015 0857   ALKPHOS 88 10/31/2014 1347   AST 311* 04/17/2015 0857   AST 35 10/31/2014 1347   ALT 264* 04/17/2015 0857   ALT 26 10/31/2014 1347   BILITOT 10.4* 04/17/2015 0857   BILITOT 0.4 10/31/2014 1347      PENDING LABS:   RADIOGRAPHIC STUDIES:  Ct Abdomen Pelvis W Wo Contrast  04/18/2015   CLINICAL DATA:  Periumbilical abdominal pain for 2 weeks. Pancreatic mass seen on endoscopy.  EXAM: CT CHEST WITH CONTRAST  CT ABDOMEN AND PELVIS WITH AND WITHOUT CONTRAST  TECHNIQUE: Multidetector CT imaging of the chest was performed during intravenous contrast administration. Multidetector CT imaging of the abdomen and pelvis was performed following the standard protocol before and during bolus administration of intravenous contrast.  CONTRAST:  164mL OMNIPAQUE IOHEXOL 300 MG/ML  SOLN  COMPARISON:  CT scan of chest of July 27, 2006. CT scan of  abdomen pelvis of October 31, 2014.  FINDINGS: CT CHEST FINDINGS  No pneumothorax or pleural effusion is noted. No acute pulmonary disease is noted. There is no evidence of thoracic aortic dissection or aneurysm. Pulmonary arteries appear normal. No mediastinal mass or adenopathy is noted. No significant osseous abnormality is noted in the chest.  CT ABDOMEN AND PELVIS FINDINGS  No gallstones are noted. Gallbladder is distended. Severe intrahepatic and extrahepatic biliary dilatation is noted, as well as pancreatic ductal dilatation, secondary to 3.0 x 2.6 cm pancreatic head mass. The spleen appears normal. Adrenal glands appear normal. No hydronephrosis or renal obstruction is noted. Stable left renal cyst is noted. Atherosclerosis of abdominal aorta is noted without aneurysm formation. There is no evidence of bowel obstruction. Diverticulosis of descending colon is noted without inflammation. The appendix appears normal. No abnormal fluid collection is noted. Uterus and ovaries appear normal. Urinary bladder appears normal. No significant adenopathy is noted. Multilevel degenerative disc disease is noted in the lower lumbar spine.  IMPRESSION: No significant abnormality seen in the chest.  Severe intrahepatic and extrahepatic biliary ductal and pancreatic ductal dilatation is noted secondary to 3 cm pancreatic head mass consistent with malignancy.   Electronically Signed   By: Marijo Conception, M.D.   On: 04/18/2015 19:17   Ct Chest W Contrast  04/18/2015   CLINICAL DATA:  Periumbilical abdominal pain for 2 weeks. Pancreatic mass seen on endoscopy.  EXAM: CT CHEST WITH CONTRAST  CT ABDOMEN AND PELVIS WITH AND WITHOUT CONTRAST  TECHNIQUE: Multidetector CT imaging of the chest was performed during intravenous contrast administration. Multidetector CT imaging of the abdomen and pelvis was performed following the standard protocol before and during bolus administration of intravenous contrast.  CONTRAST:  18mL  OMNIPAQUE IOHEXOL 300 MG/ML  SOLN  COMPARISON:  CT scan of chest of July 27, 2006. CT scan of abdomen pelvis of October 31, 2014.  FINDINGS: CT CHEST FINDINGS  No pneumothorax or pleural effusion is noted. No acute pulmonary disease is noted. There is no evidence of thoracic aortic dissection or aneurysm. Pulmonary arteries appear normal. No mediastinal mass or adenopathy is noted. No significant osseous abnormality is noted in the chest.  CT ABDOMEN AND PELVIS FINDINGS  No gallstones are noted. Gallbladder is distended. Severe intrahepatic and extrahepatic biliary dilatation is noted, as well as pancreatic ductal dilatation,  secondary to 3.0 x 2.6 cm pancreatic head mass. The spleen appears normal. Adrenal glands appear normal. No hydronephrosis or renal obstruction is noted. Stable left renal cyst is noted. Atherosclerosis of abdominal aorta is noted without aneurysm formation. There is no evidence of bowel obstruction. Diverticulosis of descending colon is noted without inflammation. The appendix appears normal. No abnormal fluid collection is noted. Uterus and ovaries appear normal. Urinary bladder appears normal. No significant adenopathy is noted. Multilevel degenerative disc disease is noted in the lower lumbar spine.  IMPRESSION: No significant abnormality seen in the chest.  Severe intrahepatic and extrahepatic biliary ductal and pancreatic ductal dilatation is noted secondary to 3 cm pancreatic head mass consistent with malignancy.   Electronically Signed   By: Marijo Conception, M.D.   On: 04/18/2015 19:17   Dg Ercp Biliary & Pancreatic Ducts  04/23/2015   CLINICAL DATA:  Biliary obstruction and jaundice  EXAM: ERCP  TECHNIQUE: Multiple spot images obtained with the fluoroscopic device and submitted for interpretation post-procedure.  FLUOROSCOPY TIME:  Radiation Exposure Index (as provided by the fluoroscopic device):  If the device does not provide the exposure index:  Fluoroscopy Time:  4 minutes  and 22 seconds  Number of Acquired Images:  10  COMPARISON:  None.  FINDINGS: Images document cannulation of the common bile duct and contrast filling the biliary tree. The common bile duct and bili tree is severely narrowed. There is tapering of the distal common bile duct consistent with the patient's obstruction. The final image demonstrates a plastic stent in place. Contrast has cleared from the dilated common bile duct suggesting adequate function of the stent.  IMPRESSION: Biliary stent placement.  These images were submitted for radiologic interpretation only. Please see the procedural report for the amount of contrast and the fluoroscopy time utilized.   Electronically Signed   By: Marybelle Killings M.D.   On: 04/23/2015 15:04     PATHOLOGY:    ASSESSMENT AND PLAN:  Pancreatic mass Head of pancreas mass, measuring 1.7 x 2.4 cm in size on EUS on 04/17/2015 and 3 cm on radiographic imaging on 04/18/2015. S/P ERCP with biopsy on 04/22/2015 but no pathology noted in [facility] at this time.  04/23/2015 brushings were non-diagnostic.  She was seen on 05/06/2015 by Dr. Eugenia Pancoast at [facility].  Information is not yet updated in [place] as of this afternoon.  Since being seen by Dr. Eugenia Pancoast, Dr. Whitney Muse has discussed the case with Dr. Eugenia Pancoast.  She has been deemed a surgical candidate and therefore, she wishes to pursue this option.    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  Guidelines are reviewed and she would be a candidate for adjuvant gemcitabine therapy (single-agent) following surgery for 6 months to reduce the risk of recurrence.  She is not too keen on chemotherapy, but she is agreeable to discuss this further following surgery.  I have contacted Dr. Eugenia Pancoast and he will make arrangements for surgery.  We will see her back in about 3 weeks to review recovery from surgery and discuss adjuvant therapy options.    THERAPY  PLAN:  Return in 3 weeks for follow-up and discuss future adjuvant treatment options.  All questions were answered. The patient knows to call the clinic with any problems, questions or concerns. We can certainly see the patient much sooner if necessary.  Patient and plan discussed with Dr. Ancil Linsey and she is  in agreement with the aforementioned.   This note is electronically signed by: Robynn Pane, PA-C 05/13/2015 2:06 PM

## 2015-05-28 ENCOUNTER — Encounter: Payer: Self-pay | Admitting: Nurse Practitioner

## 2015-05-28 ENCOUNTER — Ambulatory Visit: Payer: Medicare Other | Admitting: Nurse Practitioner

## 2015-05-28 ENCOUNTER — Telehealth: Payer: Self-pay | Admitting: Nurse Practitioner

## 2015-05-28 NOTE — Telephone Encounter (Signed)
Noted  

## 2015-05-28 NOTE — Telephone Encounter (Signed)
PATIENT WAS A NO SHOW AND LETTER SENT  °

## 2015-06-03 ENCOUNTER — Ambulatory Visit (HOSPITAL_COMMUNITY): Payer: Medicare Other | Admitting: Hematology & Oncology

## 2015-06-11 ENCOUNTER — Telehealth: Payer: Self-pay | Admitting: Internal Medicine

## 2015-06-11 NOTE — Telephone Encounter (Signed)
Noted  

## 2015-06-11 NOTE — Telephone Encounter (Signed)
Pt called to let us know that she missed her OV on 8/17 because she was in the hospital. I offered to reschedule her, but she wants to wait until she has her other appointments and surgery done first and will call back.

## 2015-06-17 ENCOUNTER — Encounter (HOSPITAL_COMMUNITY): Payer: Medicare Other | Admitting: Hematology & Oncology

## 2015-06-23 ENCOUNTER — Ambulatory Visit (HOSPITAL_COMMUNITY): Payer: Medicare Other | Admitting: Hematology & Oncology

## 2015-06-27 ENCOUNTER — Encounter (HOSPITAL_COMMUNITY): Payer: Medicare Other | Attending: Oncology | Admitting: Hematology & Oncology

## 2015-06-27 ENCOUNTER — Encounter (HOSPITAL_COMMUNITY): Payer: Self-pay | Admitting: Hematology & Oncology

## 2015-06-27 ENCOUNTER — Encounter (HOSPITAL_COMMUNITY): Payer: Medicare Other

## 2015-06-27 VITALS — BP 171/70 | HR 80 | Temp 98.9°F | Resp 18 | Wt 112.9 lb

## 2015-06-27 DIAGNOSIS — K838 Other specified diseases of biliary tract: Secondary | ICD-10-CM | POA: Insufficient documentation

## 2015-06-27 DIAGNOSIS — R634 Abnormal weight loss: Secondary | ICD-10-CM | POA: Insufficient documentation

## 2015-06-27 DIAGNOSIS — C250 Malignant neoplasm of head of pancreas: Secondary | ICD-10-CM | POA: Insufficient documentation

## 2015-06-27 MED ORDER — OXYCODONE HCL 5 MG PO TABS: 5.0000 mg | ORAL_TABLET | Freq: Four times a day (QID) | ORAL | Status: DC | PRN

## 2015-06-27 NOTE — Patient Instructions (Signed)
..  [place] at [facility] Discharge Instructions  RECOMMENDATIONS MADE BY THE CONSULTANT AND ANY TEST RESULTS WILL BE SENT TO YOUR REFERRING PHYSICIAN.  Referral to Burtis Junes dietician  Return in 1 week  Thank you for choosing [place] at [facility] to provide your oncology and hematology care.  To afford each patient quality time with our provider, please arrive at least 15 minutes before your scheduled appointment time.    You need to re-schedule your appointment should you arrive 10 or more minutes late.  We strive to give you quality time with our providers, and arriving late affects you and other patients whose appointments are after yours.  Also, if you no show three or more times for appointments you may be dismissed from the clinic at the providers discretion.     Again, thank you for choosing [facility].  Our hope is that these requests will decrease the amount of time that you wait before being seen by our physicians.       _____________________________________________________________  Should you have questions after your visit to [facility], please contact our office at [phone] between the hours of 8:30 a.m. and 4:30 p.m.  Voicemails left after 4:30 p.m. will not be returned until the following business day.  For prescription refill requests, have your pharmacy contact our office.

## 2015-06-27 NOTE — Progress Notes (Signed)
[facility] Hematology/Oncology Consultation   Name: Veronica Stevenson      MRN: [id number]      REFERRING PHYSICIAN:  Manus Rudd, MD  REASON FOR CONSULT:  3 cm Pancreatic mass at head of pancreas   DIAGNOSIS:  Adenocarcinoma of the pancreas Whipple procedure with Dr. Eugenia Pancoast on 06/05/2015 Final pathology with adenocarcinoma, ductal type, moderately differentiated, 3.0 cm and invades into the duodenal muscularis propria. Neoplasm is present at inked uncinate margin and pancreatic margin. All other margins free. No LVI. Positive for perineural invasion. 0/10 LN involved Stage IIA, T3, N0, M0 CT C/A/P on 04/18/2015 with severe intrahepatic/extrahepatic biliary ductal and pancreatic ductal dilatation secondary to 3 cm pancreatic head mass   HISTORY OF PRESENT ILLNESS:   Catricia Stevenson is a 81 year old woman with a past medical history significant for HTN and hypercholesterolemia who is referred to [facility] with pancreatic mass.  He has undergone a successful Whipple procedure with Dr. Eugenia Pancoast. She notes that for the most part she is doing well.  [name] is here alone today. She says she is very pleased with herself with regards to "surviving her surgery."  When asked about her nausea, she says food in general doesn't cause it; some does, and some doesn't. She remarks "I guess it's the taste. The medicine takes the taste away from food." She has lost 28 pounds since March and 8 pounds since early July.  As far as daily activity, she says she doesn't do much and can't do much. She says she hasn't been caretaking since her surgery and illness, ever since about August the 15th. She does walk to the mailbox and back, and other than that keeps her feet elevated to prevent swelling. She denies feeling wobbly. She has taken lasix for her LE edema and notes that it has helped.  With regards to her bowels, she says that every three or four days, she has diarrhea that starts at about  6 AM and goes until about 9:30-10. She remarks that, during that time, she goes about three times; then, for the rest of the day, she's fine.  She does not currently have physical therapy at home, and has been taking oxycodone for pain as prescribed.  She says she wants to get well and be doing better, and "get back on track."  PAST MEDICAL HISTORY:   Past Medical History  Diagnosis Date  . Essential hypertension   . Hyperlipidemia   . Asthmatic bronchitis   . Chronic pancreatitis   . Osteoarthritis of knee   . Atypical ductal hyperplasia of right breast   . Vitamin D deficiency   . GERD (gastroesophageal reflux disease)   . Hyperparathyroidism   . Zollinger-Ellison syndrome   . Carotid artery disease     ALLERGIES: Allergies  Allergen Reactions  . Codeine Itching  . Omeprazole Nausea And Vomiting  . Penicillins Other (See Comments)    "blacked out"      MEDICATIONS: I have reviewed the patient's current medications.    Current Outpatient Prescriptions on File Prior to Visit  Medication Sig Dispense Refill  . acetaminophen (TYLENOL) 500 MG tablet Take 500 mg by mouth every 6 (six) hours as needed for mild pain or moderate pain.    Marland Kitchen aspirin EC 81 MG tablet Take 81 mg by mouth every morning.    Marland Kitchen atorvastatin (LIPITOR) 10 MG tablet Take 10 mg by mouth daily after lunch.     Marland Kitchen  metFORMIN (GLUCOPHAGE) 500 MG tablet Take 500 mg by mouth daily after lunch.     . metoprolol succinate (TOPROL-XL) 25 MG 24 hr tablet Take 25 mg by mouth every evening.     . Cholecalciferol (VITAMIN D) 2000 UNITS CAPS Take 1 capsule by mouth every morning.     [name] Calcium (STOOL SOFTENER PO) Take 1 capsule by mouth daily.    Marland Kitchen HYDROcodone-acetaminophen (NORCO/VICODIN) 5-325 MG per tablet Take 0.5-1 tablets by mouth every 6 (six) hours as needed for moderate pain. (Patient not taking: Reported on 06/27/2015) 45 tablet 0  . ondansetron (ZOFRAN) 8 MG tablet Take 1 tablet (8 mg total) by mouth every  8 (eight) hours as needed for nausea or vomiting. (Patient not taking: Reported on 06/27/2015) 20 tablet 0  . polyethylene glycol (MIRALAX / GLYCOLAX) packet Take 17 g by mouth daily.     No current facility-administered medications on file prior to visit.     PAST SURGICAL HISTORY Past Surgical History  Procedure Laterality Date  . Colonoscopy  2009    [place]  . Tonsillectomy    . Cataract extraction, bilateral Bilateral   . Parathyroidectomy    . Ovarian cyst removal    . Eus N/A 04/17/2015    Jacobs-1.7 cm x 2.5 cm mass in head of pancreas causing dilation of pancreatic duct and common bile duct.  . Ercp N/A 04/23/2015    [id number], heterogeneous,indistinctly bordered mass in head of pancreas  . Sphincterotomy N/A 04/23/2015    Procedure: SPHINCTEROTOMY;  Surgeon: Daneil Dolin, MD;  Location: [facility];  Service: Endoscopy;  Laterality: N/A;  . Biliary stent placement N/A 04/23/2015    Procedure: BILIARY STENT PLACEMENT;  Surgeon: Daneil Dolin, MD;  Location: [facility];  Service: Endoscopy;  Laterality: N/A;  . Whipple procedure  August 15th 2016    FAMILY HISTORY: Family History  Problem Relation Age of Onset  . CAD Mother   . Stroke Father   . Stroke Brother   . Breast cancer Sister    She has 1 brother and 1 sister still living.  She is 1 children out of 12 in total.  Her mother and father both passed from a stroke in their 20's.  Oldest sister passed from cancer, unknown type, at the age of 79 yo.  SOCIAL HISTORY:  reports that she has never smoked. She has never used smokeless tobacco. She reports that she does not drink alcohol or use illicit drugs.  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  She is never married.  She has no children.  However, she has nieces and nephews in the area.  She has 1 brother and 1 sister still living.  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  PERFORMANCE STATUS: The  patient's performance status is 1 - Symptomatic but completely ambulatory  14 point review of systems was performed and is negative except as detailed under history of present illness and above  PHYSICAL EXAM: Most Recent Vital Signs: Blood pressure 171/70, pulse 80, temperature 98.9 F (37.2 C), resp. rate 18, weight 112 lb 14.4 oz (51.211 kg), SpO2 100 %. General appearance: alert, cooperative, appears stated age, icteric and no distress Thin Head: Normocephalic, without obvious abnormality, atraumatic Eyes: positive findings: scleral icterus Throat: lips, mucosa, and tongue normal; teeth and gums normal Neck: no adenopathy and supple, symmetrical, trachea midline Lungs: clear to auscultation bilaterally and  normal percussion bilaterally Heart: regular rate and rhythm, S1, S2 normal, no murmur, click, rub or gallop Abdomen: soft, non-tender; bowel sounds normal; no masses,  no organomegaly  Well-healing surgical incision site; non-painful Extremities: extremities normal, atraumatic, no cyanosis or edema  Pitting edema from mid-calf down on both legs; symmetrical Skin: Skin color, texture, turgor normal. No rashes or lesions Lymph nodes: Cervical, supraclavicular, and axillary nodes normal. Neurologic: Alert and oriented X 3, normal strength and tone. Normal symmetric reflexes. Normal coordination and gait  LABORATORY DATA:   I have reviewed the labs below.  CA 19-9 0 - 35 U/mL 701 (H)       PATHOLOGY:  None  ASSESSMENT/PLAN:  Adenocarcinoma of the pancreas, T3 N0 M0, positive uncinate margin Whipple procedure with Dr. Eugenia Pancoast on 06/05/2015 Weight loss Lower extremity edema  I discussed with the patient her final pathology report. Unfortunately she does have a positive margin. I am not sure she would tolerate radiation. We can certainly discuss this. We discussed today the role of systemic chemotherapy and adjuvant treatment. I advised her she has several weeks head of her to  heal and regain strength. We should try to start adjuvant therapy less than 12 weeks from surgery. We discussed systemic gemcitabine which is a category 1 recommendation in addition 5-FU leucovorin can also be considered.  I will have nutrition work with the patient. I will plan on seeing her back next week to reassess her weight and performance status. She will need a repeat CA-19-9 at some point. Her preoperative tumor Marker was 701 U/mL.  I refilled her pain medication.  If, at our next appointment, she is still feeling like she needs to sit and keep her feet up, I will refer her for physical therapy at home.   Orders Placed This Encounter  Procedures  . CBC with Differential    Standing Status: Future     Number of Occurrences:      Standing Expiration Date: 06/26/2016  . Comprehensive metabolic panel    Standing Status: Future     Number of Occurrences:      Standing Expiration Date: 06/26/2016    All questions were answered. The patient knows to call the clinic with any problems, questions or concerns. We can certainly see the patient much sooner if necessary.  This document serves as a record of services personally performed by Ancil Linsey, MD. It was created on her behalf by Toni Amend, a trained medical scribe. The creation of this record is based on the scribe's personal observations and the Advika Mclelland's statements to them. This document has been checked and approved by the attending Durant Scibilia.  I have reviewed the above documentation for accuracy and completeness, and I agree with the above.  This note is electronically signed by Molli Hazard, MD

## 2015-07-04 ENCOUNTER — Encounter: Payer: Self-pay | Admitting: Dietician

## 2015-07-04 ENCOUNTER — Encounter (HOSPITAL_BASED_OUTPATIENT_CLINIC_OR_DEPARTMENT_OTHER): Payer: Medicare Other | Admitting: Hematology & Oncology

## 2015-07-04 ENCOUNTER — Encounter (HOSPITAL_COMMUNITY): Payer: Self-pay | Admitting: Hematology & Oncology

## 2015-07-04 VITALS — BP 172/72 | HR 67 | Temp 98.4°F | Resp 16 | Wt 112.8 lb

## 2015-07-04 DIAGNOSIS — C250 Malignant neoplasm of head of pancreas: Secondary | ICD-10-CM

## 2015-07-04 DIAGNOSIS — C259 Malignant neoplasm of pancreas, unspecified: Secondary | ICD-10-CM | POA: Insufficient documentation

## 2015-07-04 DIAGNOSIS — R634 Abnormal weight loss: Secondary | ICD-10-CM | POA: Diagnosis not present

## 2015-07-04 DIAGNOSIS — K838 Other specified diseases of biliary tract: Secondary | ICD-10-CM

## 2015-07-04 DIAGNOSIS — K831 Obstruction of bile duct: Secondary | ICD-10-CM

## 2015-07-04 MED ORDER — PANCRELIPASE (LIP-PROT-AMYL) 12000-38000 UNITS PO CPEP: 12000.0000 [IU] | ORAL_CAPSULE | Freq: Three times a day (TID) | ORAL | Status: DC

## 2015-07-04 NOTE — Patient Instructions (Signed)
[place] [facility] Discharge Instructions  RECOMMENDATIONS MADE BY THE CONSULTANT AND ANY TEST RESULTS WILL BE SENT TO YOUR REFERRING PHYSICIAN.  Exam and discussion by Dr. Whitney Muse Plans are for you to get chemotherapy using gemzar - will discuss more on your return [place] - take as directed  Follow-up in 2 weeks.  Thank you for choosing [place] at [facility] to provide your oncology and hematology care.  To afford each patient quality time with our provider, please arrive at least 15 minutes before your scheduled appointment time.    You need to re-schedule your appointment should you arrive 10 or more minutes late.  We strive to give you quality time with our providers, and arriving late affects you and other patients whose appointments are after yours.  Also, if you no show three or more times for appointments you may be dismissed from the clinic at the providers discretion.     Again, thank you for choosing [facility].  Our hope is that these requests will decrease the amount of time that you wait before being seen by our physicians.       _____________________________________________________________  Should you have questions after your visit to [facility], please contact our office at [phone] between the hours of 8:30 a.m. and 4:30 p.m.  Voicemails left after 4:30 p.m. will not be returned until the following business day.  For prescription refill requests, have your pharmacy contact our office.

## 2015-07-04 NOTE — Progress Notes (Signed)
[facility] Hematology/Oncology Consultation   Name: Veronica Stevenson      MRN: [id number]      REFERRING PHYSICIAN:  Manus Rudd, MD  REASON FOR CONSULT:  3 cm Pancreatic mass at head of pancreas   DIAGNOSIS:  Adenocarcinoma of the pancreas Whipple procedure with Dr. Eugenia Pancoast on 06/05/2015 Final pathology with adenocarcinoma, ductal type, moderately differentiated, 3.0 cm and invades into the duodenal muscularis propria. Neoplasm is present at inked uncinate margin and pancreatic margin. All other margins free. No LVI. Positive for perineural invasion. 0/10 LN involved Stage IIA, T3, N0, M0 CT C/A/P on 04/18/2015 with severe intrahepatic/extrahepatic biliary ductal and pancreatic ductal dilatation secondary to 3 cm pancreatic head mass   HISTORY OF PRESENT ILLNESS:   Veronica Stevenson is a 81 year old woman with a past medical history significant for HTN and hypercholesterolemia who is referred to [facility] with pancreatic cancer.  She has undergone a successful Whipple procedure with Dr. Eugenia Pancoast. She notes that for the most part she is doing well.  [name] is here alone today. She says she is very pleased with herself with regards to "surviving her surgery."  [name] is here alone today. [name] has been to see a dietician and her weight is stable. She has been walking around the house and to the mailbox. She has not been staying in the bed, as she used to. She washes the dishes and tries to stay busy. She sleeps well at night. She is able to shower and bath with some assistance.  She notes that her legs are still swollen, but they are improving. Her surgery was performed on August 18th. Ms. Colon had a bout of vomiting this past Tuesday, 9/20. She took Metformin prior to the vomiting. She has not taken Metformin since this instance. [name] does not check her blood sugar levels. She continues to make steady improvements in her functional status.   PAST MEDICAL  HISTORY:   Past Medical History  Diagnosis Date  . Essential hypertension   . Hyperlipidemia   . Asthmatic bronchitis   . Chronic pancreatitis   . Osteoarthritis of knee   . Atypical ductal hyperplasia of right breast   . Vitamin D deficiency   . GERD (gastroesophageal reflux disease)   . Hyperparathyroidism   . Zollinger-Ellison syndrome   . Carotid artery disease     ALLERGIES: Allergies  Allergen Reactions  . Codeine Itching  . Omeprazole Nausea And Vomiting  . Penicillins Other (See Comments)    "blacked out"      MEDICATIONS: I have reviewed the patient's current medications.    Current Outpatient Prescriptions on File Prior to Visit  Medication Sig Dispense Refill  . aspirin EC 81 MG tablet Take 81 mg by mouth every morning.    Marland Kitchen atorvastatin (LIPITOR) 10 MG tablet Take 10 mg by mouth daily after lunch.     . Cholecalciferol (VITAMIN D) 2000 UNITS CAPS Take 1 capsule by mouth every morning.     Marland Kitchen losartan (COZAAR) 100 MG tablet Take 100 mg by mouth daily.     . metFORMIN (GLUCOPHAGE) 500 MG tablet Take 500 mg by mouth daily after lunch.     . metoCLOPramide (REGLAN) 10 MG tablet Take 10 mg by mouth 4 (four) times daily. And at night  0  . metoprolol succinate (TOPROL-XL) 25 MG 24 hr tablet Take 25 mg by mouth every evening.     Marland Kitchen  ondansetron (ZOFRAN) 8 MG tablet Take 1 tablet (8 mg total) by mouth every 8 (eight) hours as needed for nausea or vomiting. 20 tablet 0  . oxyCODONE (OXY IR/ROXICODONE) 5 MG immediate release tablet Take 1 tablet (5 mg total) by mouth every 6 (six) hours as needed. 45 tablet 0  . acetaminophen (TYLENOL) 500 MG tablet Take 500 mg by mouth every 6 (six) hours as needed for mild pain or moderate pain.    [name] Calcium (STOOL SOFTENER PO) Take 1 capsule by mouth daily.    Marland Kitchen loperamide (IMODIUM A-D) 2 MG tablet Take 2 mg by mouth as needed for diarrhea or loose stools.    . polyethylene glycol (MIRALAX / GLYCOLAX) packet Take 17 g by mouth  daily.     No current facility-administered medications on file prior to visit.     PAST SURGICAL HISTORY Past Surgical History  Procedure Laterality Date  . Colonoscopy  2009    [place]  . Tonsillectomy    . Cataract extraction, bilateral Bilateral   . Parathyroidectomy    . Ovarian cyst removal    . Eus N/A 04/17/2015    Jacobs-1.7 cm x 2.5 cm mass in head of pancreas causing dilation of pancreatic duct and common bile duct.  . Ercp N/A 04/23/2015    [id number], heterogeneous,indistinctly bordered mass in head of pancreas  . Sphincterotomy N/A 04/23/2015    Procedure: SPHINCTEROTOMY;  Surgeon: Daneil Dolin, MD;  Location: [facility];  Service: Endoscopy;  Laterality: N/A;  . Biliary stent placement N/A 04/23/2015    Procedure: BILIARY STENT PLACEMENT;  Surgeon: Daneil Dolin, MD;  Location: [facility];  Service: Endoscopy;  Laterality: N/A;  . Whipple procedure  August 15th 2016    FAMILY HISTORY: Family History  Problem Relation Age of Onset  . CAD Mother   . Stroke Father   . Stroke Brother   . Breast cancer Sister    She has 1 brother and 1 sister still living.  She is 1 children out of 12 in total.  Her mother and father both passed from a stroke in their 22's.  Oldest sister passed from cancer, unknown type, at the age of 79 yo.  SOCIAL HISTORY:  reports that she has never smoked. She has never used smokeless tobacco. She reports that she does not drink alcohol or use illicit drugs.  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  She is never married.  She has no children.  However, she has nieces and nephews in the area.  She has 1 brother and 1 sister still living.  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  PERFORMANCE STATUS: The patient's performance status is 1 - Symptomatic but completely ambulatory  14 point review of systems was performed and is negative except as detailed under history of present  illness and above Positive for leg swelling.    Noted improvement. Positive for vomiting.    Vomited on Tuesday, 9/20, after taking Metformin.  PHYSICAL EXAM: Most Recent Vital Signs: Blood pressure 172/72, pulse 67, temperature 98.4 F (36.9 C), temperature source Oral, resp. rate 16, weight 112 lb 12.8 oz (51.166 kg), SpO2 99 %. General appearance: alert, cooperative, appears stated age, icteric and no distress Thin Head: Normocephalic, without obvious abnormality, atraumatic Eyes: positive findings: scleral icterus Throat: lips, mucosa, and tongue normal; teeth and gums normal Neck: no adenopathy and supple, symmetrical, trachea  midline Lungs: clear to auscultation bilaterally and normal percussion bilaterally Heart: regular rate and rhythm, S1, S2 normal, no murmur, click, rub or gallop Abdomen: soft, non-tender; bowel sounds normal; no masses,  no organomegaly  Well-healing surgical incision site; non-painful Extremities: extremities normal, atraumatic, no cyanosis   Pitting edema to mid calf, 1-2+; improved since last visit Skin: Skin color, texture, turgor normal. No rashes or lesions Lymph nodes: Cervical, supraclavicular, and axillary nodes normal. Neurologic: Alert and oriented X 3, normal strength and tone. Normal symmetric reflexes. Normal coordination and gait  LABORATORY DATA:   I have reviewed the labs below. Pre-op Ca 19-9  CA 19-9 0 - 35 U/mL 701 (H)        PATHOLOGY:    ASSESSMENT/PLAN:  Adenocarcinoma of the pancreas, T3 N0 M0, positive uncinate margin Whipple procedure with Dr. Eugenia Pancoast on 06/05/2015 Weight loss Lower extremity edema  I discussed with the patient her final pathology report. Unfortunately she does have a positive margin. I am not sure she would tolerate radiation. We can certainly discuss this moving forward. We discussed today the role of systemic chemotherapy and adjuvant treatment. I advised her she has several weeks head of her to heal  and regain strength. We should try to start adjuvant therapy less than 12 weeks from surgery. We discussed systemic gemcitabine which is a category 1 recommendation in addition 5-FU leucovorin can also be considered.  She continues to make slow and steady improvements in her activity, appetite and PS.  I will see her back in 2 weeks at which time we can begin to plan on moving forward with adjuvant therapy.    Orders Placed This Encounter  Procedures  . CBC with Differential    Standing Status: Future     Number of Occurrences:      Standing Expiration Date: 07/03/2016  . Comprehensive metabolic panel    Standing Status: Future     Number of Occurrences:      Standing Expiration Date: 07/03/2016  . Cancer antigen 19-9    Standing Status: Future     Number of Occurrences:      Standing Expiration Date: 07/03/2016    All questions were answered. The patient knows to call the clinic with any problems, questions or concerns. We can certainly see the patient much sooner if necessary.  This document serves as a record of services personally performed by Ancil Linsey, MD. It was created on her behalf by Arlyce Harman, a trained medical scribe. The creation of this record is based on the scribe's personal observations and the provider's statements to them. This document has been checked and approved by the attending provider.  I have reviewed the above documentation for accuracy and completeness, and I agree with the above.  This note is electronically signed by  Kelby Fam. Whitney Muse, MD

## 2015-07-04 NOTE — Progress Notes (Addendum)
Pt s/p whipple procedure. Has been experiencing n/v  Wt Readings from Last 10 Encounters:  07/04/15 112 lb 12.8 oz (51.166 kg)  06/27/15 112 lb 14.4 oz (51.211 kg)  05/13/15 120 lb (54.432 kg)  04/25/15 120 lb 3.2 oz (54.522 kg)  04/22/15 119 lb 12.8 oz (54.341 kg)  04/17/15 126 lb (57.153 kg)  03/24/15 126 lb 12.8 oz (57.516 kg)  02/25/15 129 lb (58.514 kg)  01/23/15 135 lb (61.236 kg)  01/08/15 139 lb 8.8 oz (63.3 kg)   Patient weight has decreased by 8 lbs since I saw her. Considering the type of surgery she had, this is  surprising.   Patient reports oral intake as fair. She has had some N/V, but reports that these symptoms have improved this past week.  I discussed with her that eating foods high in fat may precipitate n/v due to its slower gastric emptying.   I asked if she has had any symptoms of fat malabsorption. She did state she had large amounts of diarrhea after eating a breakfast of eggs, bacon, and sausage. However, from the way she described her stools they did not seem to be reflective of steatorhea.  She states she has been taking immodium per her surgeon and this has helped with these symptoms. From what I could tell, she is not taking any pancreatic enzymes.   Educated pt that the part of her S.I. That was removed was where the greatest calcium absorption occurs. I recommended a calcium supplement to take with her Vitamin D.   She states she has had a poor appetite. She believes her oxycodone is causing this. She also has "not taste" which is negatively impacting her already poor appetite.  I see she is also on metformin which can cause some of the symptoms she described as well as a decreased appetite. She reports being placed on this in May 2016.   Asked her to discuss her medications and potential side effects with MD.   Will continue to follow pt's progress.   Burtis Junes RD, LDN Nutrition Pager: [phone] 07/04/2015 8:50 AM

## 2015-07-12 ENCOUNTER — Encounter (HOSPITAL_COMMUNITY): Payer: Self-pay | Admitting: Hematology & Oncology

## 2015-07-18 ENCOUNTER — Encounter (HOSPITAL_COMMUNITY): Payer: Self-pay | Admitting: Hematology & Oncology

## 2015-07-18 ENCOUNTER — Encounter (HOSPITAL_BASED_OUTPATIENT_CLINIC_OR_DEPARTMENT_OTHER): Payer: Medicare Other

## 2015-07-18 ENCOUNTER — Encounter: Payer: Self-pay | Admitting: Dietician

## 2015-07-18 ENCOUNTER — Encounter (HOSPITAL_COMMUNITY): Payer: Medicare Other | Attending: Oncology | Admitting: Hematology & Oncology

## 2015-07-18 ENCOUNTER — Other Ambulatory Visit (HOSPITAL_COMMUNITY): Payer: Self-pay

## 2015-07-18 VITALS — BP 154/62 | HR 62 | Temp 98.6°F | Resp 16 | Wt 113.2 lb

## 2015-07-18 DIAGNOSIS — C250 Malignant neoplasm of head of pancreas: Secondary | ICD-10-CM | POA: Diagnosis not present

## 2015-07-18 DIAGNOSIS — R634 Abnormal weight loss: Secondary | ICD-10-CM | POA: Insufficient documentation

## 2015-07-18 DIAGNOSIS — Z0000 Encounter for general adult medical examination without abnormal findings: Secondary | ICD-10-CM

## 2015-07-18 DIAGNOSIS — I10 Essential (primary) hypertension: Secondary | ICD-10-CM | POA: Diagnosis not present

## 2015-07-18 DIAGNOSIS — K831 Obstruction of bile duct: Secondary | ICD-10-CM

## 2015-07-18 DIAGNOSIS — Z23 Encounter for immunization: Secondary | ICD-10-CM

## 2015-07-18 DIAGNOSIS — K838 Other specified diseases of biliary tract: Secondary | ICD-10-CM | POA: Diagnosis present

## 2015-07-18 DIAGNOSIS — E7800 Pure hypercholesterolemia, unspecified: Secondary | ICD-10-CM | POA: Diagnosis not present

## 2015-07-18 LAB — CBC WITH DIFFERENTIAL/PLATELET
BASOS ABS: 0 10*3/uL (ref 0.0–0.1)
Basophils Relative: 0 %
Eosinophils Absolute: 0.2 10*3/uL (ref 0.0–0.7)
Eosinophils Relative: 2 %
HEMATOCRIT: 32.3 % — AB (ref 36.0–46.0)
Hemoglobin: 10.8 g/dL — ABNORMAL LOW (ref 12.0–15.0)
LYMPHS PCT: 42 %
Lymphs Abs: 3 10*3/uL (ref 0.7–4.0)
MCH: 28.6 pg (ref 26.0–34.0)
MCHC: 33.4 g/dL (ref 30.0–36.0)
MCV: 85.7 fL (ref 78.0–100.0)
Monocytes Absolute: 0.4 10*3/uL (ref 0.1–1.0)
Monocytes Relative: 6 %
NEUTROS ABS: 3.5 10*3/uL (ref 1.7–7.7)
Neutrophils Relative %: 50 %
PLATELETS: 241 10*3/uL (ref 150–400)
RBC: 3.77 MIL/uL — ABNORMAL LOW (ref 3.87–5.11)
RDW: 14.4 % (ref 11.5–15.5)
WBC: 7.1 10*3/uL (ref 4.0–10.5)

## 2015-07-18 LAB — COMPREHENSIVE METABOLIC PANEL
ALT: 23 U/L (ref 14–54)
AST: 35 U/L (ref 15–41)
Albumin: 2.9 g/dL — ABNORMAL LOW (ref 3.5–5.0)
Alkaline Phosphatase: 70 U/L (ref 38–126)
Anion gap: 7 (ref 5–15)
BILIRUBIN TOTAL: 1 mg/dL (ref 0.3–1.2)
BUN: 15 mg/dL (ref 6–20)
CHLORIDE: 107 mmol/L (ref 101–111)
CO2: 27 mmol/L (ref 22–32)
Calcium: 8 mg/dL — ABNORMAL LOW (ref 8.9–10.3)
Creatinine, Ser: 1.45 mg/dL — ABNORMAL HIGH (ref 0.44–1.00)
GFR, EST AFRICAN AMERICAN: 38 mL/min — AB (ref >60)
GFR, EST NON AFRICAN AMERICAN: 33 mL/min — AB (ref >60)
Glucose, Bld: 94 mg/dL (ref 65–99)
Potassium: 3 mmol/L — ABNORMAL LOW (ref 3.5–5.1)
Sodium: 141 mmol/L (ref 135–145)
TOTAL PROTEIN: 6.5 g/dL (ref 6.5–8.1)

## 2015-07-18 MED ORDER — INFLUENZA VAC SPLIT QUAD 0.5 ML IM SUSY
0.5000 mL | PREFILLED_SYRINGE | Freq: Once | INTRAMUSCULAR | Status: AC
  Administered 2015-07-18: 0.5 mL via INTRAMUSCULAR
  Filled 2015-07-18: qty 0.5

## 2015-07-18 NOTE — Progress Notes (Signed)
Opened in error

## 2015-07-18 NOTE — Progress Notes (Signed)
[facility] Hematology/Oncology Consultation   Name: [name]      MRN: [id number]      REFERRING PHYSICIAN:  Manus Rudd, MD  REASON FOR CONSULT:  3 cm Pancreatic mass at head of pancreas   DIAGNOSIS:  Adenocarcinoma of the pancreas Whipple procedure with Dr. Eugenia Pancoast on 06/05/2015 Final pathology with adenocarcinoma, ductal type, moderately differentiated, 3.0 cm and invades into the duodenal muscularis propria. Neoplasm is present at inked uncinate margin and pancreatic margin. All other margins free. No LVI. Positive for perineural invasion. 0/10 LN involved Stage IIA, T3, N0, M0 CT C/A/P on 04/18/2015 with severe intrahepatic/extrahepatic biliary ductal and pancreatic ductal dilatation secondary to 3 cm pancreatic head mass   HISTORY OF PRESENT ILLNESS:   Veronica Stevenson is a 81 year old woman with a past medical history significant for HTN and hypercholesterolemia who is referred to [facility] with pancreatic cancer.  She has undergone a successful Whipple procedure with Dr. Eugenia Pancoast. She notes that for the most part she is doing well.  [name] is here alone today. She is not feeling well, but she does look better than last time.  She says she began having her current nausea and diarrhea symptoms 2-3 weeks ago.  She reports that she takes her medicine with food, but then within 2-3 hours, it's all coming back up. She says she's tried eating smaller portions, but that it hasn't helped. She is not vomiting every meal; it's more like once every other day "or something like that." She says her sister's been cooking her good meals, but that whenever she takes whatever medicine she's supposed to take, then it all comes back up. She is also throwing up her "pancreatic enzyme supplement.".  She denies any constipation, and states adamantly that she is not taking Miralax or stool softener anymore.  She says her pain comes back every other day in the afternoon, after  doing activities like going to the store etc. She remarks that she still has some tenderness in her stomach, but confirms that she's been more active these days.  [name] is still taking the "regular medicines" that she takes. She can take her pain medicines without vomiting, including oxycodone. She usually only takes one per day.  .Her legs are still swollen and only "marginally better."   PAST MEDICAL HISTORY:   Past Medical History  Diagnosis Date  . Essential hypertension   . Hyperlipidemia   . Asthmatic bronchitis   . Chronic pancreatitis ([place])   . Osteoarthritis of knee   . Atypical ductal hyperplasia of right breast   . Vitamin D deficiency   . GERD (gastroesophageal reflux disease)   . Hyperparathyroidism ([place])   . Zollinger-Ellison syndrome   . Carotid artery disease ([facility])     ALLERGIES: Allergies  Allergen Reactions  . Codeine Itching  . Omeprazole Nausea And Vomiting  . Penicillins Other (See Comments)    "blacked out"      MEDICATIONS: I have reviewed the patient's current medications.    Current Outpatient Prescriptions on File Prior to Visit  Medication Sig Dispense Refill  . acetaminophen (TYLENOL) 500 MG tablet Take 500 mg by mouth every 6 (six) hours as needed for mild pain or moderate pain.    Marland Kitchen aspirin EC 81 MG tablet Take 81 mg by mouth every morning.    Marland Kitchen atorvastatin (LIPITOR) 10 MG tablet Take 10 mg by mouth daily after lunch.     Marland Kitchen  Cholecalciferol (VITAMIN D) 2000 UNITS CAPS Take 1 capsule by mouth every morning.     . loperamide (IMODIUM A-D) 2 MG tablet Take 2 mg by mouth as needed for diarrhea or loose stools.    Marland Kitchen losartan (COZAAR) 100 MG tablet Take 100 mg by mouth daily.     . metoCLOPramide (REGLAN) 10 MG tablet Take 10 mg by mouth 4 (four) times daily. And at night  0  . metoprolol succinate (TOPROL-XL) 25 MG 24 hr tablet Take 25 mg by mouth every evening.     . ondansetron (ZOFRAN) 8 MG tablet Take 1 tablet (8 mg total) by mouth every 8  (eight) hours as needed for nausea or vomiting. 20 tablet 0  . oxyCODONE (OXY IR/ROXICODONE) 5 MG immediate release tablet Take 1 tablet (5 mg total) by mouth every 6 (six) hours as needed. 45 tablet 0  . Docusate Calcium (STOOL SOFTENER PO) Take 1 capsule by mouth daily.    . lipase/protease/amylase (CREON) 12000 UNITS CPEP capsule Take 1 capsule (12,000 Units total) by mouth 3 (three) times daily with meals. (Patient not taking: Reported on 07/18/2015) 270 capsule 3  . metFORMIN (GLUCOPHAGE) 500 MG tablet Take 500 mg by mouth daily after lunch.     . polyethylene glycol (MIRALAX / GLYCOLAX) packet Take 17 g by mouth daily.     No current facility-administered medications on file prior to visit.     PAST SURGICAL HISTORY Past Surgical History  Procedure Laterality Date  . Colonoscopy  2009    [place]  . Tonsillectomy    . Cataract extraction, bilateral Bilateral   . Parathyroidectomy    . Ovarian cyst removal    . Eus N/A 04/17/2015    Jacobs-1.7 cm x 2.5 cm mass in head of pancreas causing dilation of pancreatic duct and common bile duct.  . Ercp N/A 04/23/2015    VPX:TGGYIRSWNI, heterogeneous,indistinctly bordered mass in head of pancreas  . Sphincterotomy N/A 04/23/2015    Procedure: SPHINCTEROTOMY;  Surgeon: Daneil Dolin, MD;  Location: [facility];  Service: Endoscopy;  Laterality: N/A;  . Biliary stent placement N/A 04/23/2015    Procedure: BILIARY STENT PLACEMENT;  Surgeon: Daneil Dolin, MD;  Location: [facility];  Service: Endoscopy;  Laterality: N/A;  . Whipple procedure  August 15th 2016    FAMILY HISTORY: Family History  Problem Relation Age of Onset  . CAD Mother   . Stroke Father   . Stroke Brother   . Breast cancer Sister    She has 1 brother and 1 sister still living.  She is 1 children out of 12 in total.  Her mother and father both passed from a stroke in their 77's.  Oldest sister passed from cancer, unknown type, at the age of 79 yo.  SOCIAL HISTORY:  reports that  she has never smoked. She has never used smokeless tobacco. She reports that she does not drink alcohol or use illicit drugs.  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  She is never married.  She has no children.  However, she has nieces and nephews in the area.  She has 1 brother and 1 sister still living.  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  PERFORMANCE STATUS: The patient's performance status is 1 - Symptomatic but completely ambulatory  14 point review of systems was performed and is negative except as detailed under history of present  illness and above Positive for leg swelling.    Noted improvement. Positive for vomiting.    Vomited on Tuesday, 9/20, after taking Metformin.  14 point review of systems was performed and is negative except as detailed under history of present illness and above   PHYSICAL EXAM: Most Recent Vital Signs: Blood pressure 154/62, pulse 62, temperature 98.6 F (37 C), temperature source Oral, resp. rate 16, weight 113 lb 3.2 oz (51.347 kg), SpO2 99 %. General appearance: alert, cooperative, appears stated age, icteric and no distress Thin Head: Normocephalic, without obvious abnormality, atraumatic Eyes: positive findings: scleral icterus Throat: lips, mucosa, and tongue normal; teeth and gums normal Neck: no adenopathy and supple, symmetrical, trachea midline Lungs: clear to auscultation bilaterally and normal percussion bilaterally Heart: regular rate and rhythm, S1, S2 normal, no murmur, click, rub or gallop Abdomen: soft, non-tender; bowel sounds normal; no masses,  no organomegaly  Well-healing surgical incision site; non-painful Extremities: extremities normal, atraumatic, no cyanosis   Pitting edema to mid calf, 1-2+; improved since last visit Skin: Skin color, texture, turgor normal. No rashes or lesions Lymph nodes: Cervical, supraclavicular, and axillary nodes  normal. Neurologic: Alert and oriented X 3, normal strength and tone. Normal symmetric reflexes. Normal coordination and gait  LABORATORY DATA: I have reviewed the labs below.  CBC    Component Value Date/Time   WBC 7.1 07/18/2015 0852   WBC 7.9 10/31/2014 1347   RBC 3.77* 07/18/2015 0852   RBC 4.17 10/31/2014 1347   HGB 10.8* 07/18/2015 0852   HGB 12.2 10/31/2014 1347   HCT 32.3* 07/18/2015 0852   HCT 38.4 10/31/2014 1347   PLT 241 07/18/2015 0852   PLT 218 10/31/2014 1347   MCV 85.7 07/18/2015 0852   MCV 92 10/31/2014 1347   MCH 28.6 07/18/2015 0852   MCH 29.2 10/31/2014 1347   MCHC 33.4 07/18/2015 0852   MCHC 31.8* 10/31/2014 1347   RDW 14.4 07/18/2015 0852   RDW 13.8 10/31/2014 1347   LYMPHSABS 3.0 07/18/2015 0852   LYMPHSABS 2.4 10/31/2014 1347   MONOABS 0.4 07/18/2015 0852   MONOABS 0.6 10/31/2014 1347   EOSABS 0.2 07/18/2015 0852   EOSABS 0.1 10/31/2014 1347   BASOSABS 0.0 07/18/2015 0852   BASOSABS 0.0 10/31/2014 1347   CMP     Component Value Date/Time   NA 141 07/18/2015 0852   NA 142 10/31/2014 1347   K 3.0* 07/18/2015 0852   K 3.5 10/31/2014 1347   CL 107 07/18/2015 0852   CL 108* 10/31/2014 1347   CO2 27 07/18/2015 0852   CO2 26 10/31/2014 1347   GLUCOSE 94 07/18/2015 0852   GLUCOSE 89 10/31/2014 1347   BUN 15 07/18/2015 0852   BUN 19* 10/31/2014 1347   CREATININE 1.45* 07/18/2015 0852   CREATININE 0.95 01/23/2015 1452   CREATININE 1.10 10/31/2014 1347   CALCIUM 8.0* 07/18/2015 0852   CALCIUM 9.3 10/31/2014 1347   PROT 6.5 07/18/2015 0852   PROT 7.6 10/31/2014 1347   ALBUMIN 2.9* 07/18/2015 0852   ALBUMIN 3.6 10/31/2014 1347   AST 35 07/18/2015 0852   AST 35 10/31/2014 1347   ALT 23 07/18/2015 0852   ALT 26 10/31/2014 1347   ALKPHOS 70 07/18/2015 0852   ALKPHOS 88 10/31/2014 1347   BILITOT 1.0 07/18/2015 0852   BILITOT 0.4 10/31/2014 1347   GFRNONAA 33* 07/18/2015 0852   GFRNONAA 51* 10/31/2014 1347   GFRAA 38* 07/18/2015 0852    GFRAA >60 10/31/2014 1347  Pre-op Ca 19-9  CA 19-9 0 - 35 U/mL 701 (H)        PATHOLOGY:     ASSESSMENT/PLAN:  Adenocarcinoma of the pancreas, T3 N0 M0, positive uncinate margin Whipple procedure with Dr. Eugenia Pancoast on 06/05/2015 Weight loss Lower extremity edema  The plan is for adjuvant Gemzar. I would like to get her started. I have recommended decreasing her reglan to twice daily given her diarrhea. I have held her atorvasatin, I advised her not to take her pancreatic enzymes. She does not seem to have signs/symptoms of malabsorption.  I will have dietary meet with her today.  In regards to her performance status she is improving. She is now going out of the house and "shopping. Her lower extremity edema is persistent but improved. We will see her back next week and if she has made further improvements we will plan on starting chemotherapy the following. I advised her if her symptoms worsen over the next several days to let us know early next week. Her weight is stable.  Ca 19-9 is pending today.  All questions were answered. The patient knows to call the clinic with any problems, questions or concerns. We can certainly see the patient much sooner if necessary.  This document serves as a record of services personally performed by Ancil Linsey, MD. It was created on her behalf by Toni Amend, a trained medical scribe. The creation of this record is based on the scribe's personal observations and the provider's statements to them. This document has been checked and approved by the attending provider.  I have reviewed the above documentation for accuracy and completeness, and I agree with the above.  This note is electronically signed by  Kelby Fam. Whitney Muse, MD

## 2015-07-18 NOTE — Progress Notes (Signed)
Veronica Stevenson presents today for injection per MD orders. Flu vaccine administered IM in left DELTOID. Administration without incident. Patient tolerated well.

## 2015-07-18 NOTE — Patient Instructions (Signed)
[place] at [facility] Discharge Instructions  RECOMMENDATIONS MADE BY THE CONSULTANT AND ANY TEST RESULTS WILL BE SENT TO YOUR REFERRING PHYSICIAN.  Exam and discussion by Dr. Whitney Muse. Stop the following medications:  Atorvastatin  Creon  Miralax  Stool Softener Change Metochlopramide to twice daily Flu vaccine today Will have boost for you on your next visit    Follow-up in 1 week.  Thank you for choosing [place] at [facility] to provide your oncology and hematology care.  To afford each patient quality time with our provider, please arrive at least 15 minutes before your scheduled appointment time.    You need to re-schedule your appointment should you arrive 10 or more minutes late.  We strive to give you quality time with our providers, and arriving late affects you and other patients whose appointments are after yours.  Also, if you no show three or more times for appointments you may be dismissed from the clinic at the providers discretion.     Again, thank you for choosing [facility].  Our hope is that these requests will decrease the amount of time that you wait before being seen by our physicians.       _____________________________________________________________  Should you have questions after your visit to [facility], please contact our office at [phone] between the hours of 8:30 a.m. and 4:30 p.m.  Voicemails left after 4:30 p.m. will not be returned until the following business day.  For prescription refill requests, have your pharmacy contact our office.

## 2015-07-18 NOTE — Progress Notes (Signed)
Pt had asked about receiving case of Ensure  Wt Readings from Last 10 Encounters:  07/18/15 113 lb 3.2 oz (51.347 kg)  07/04/15 112 lb 12.8 oz (51.166 kg)  06/27/15 112 lb 14.4 oz (51.211 kg)  05/13/15 120 lb (54.432 kg)  04/25/15 120 lb 3.2 oz (54.522 kg)  04/22/15 119 lb 12.8 oz (54.341 kg)  04/17/15 126 lb (57.153 kg)  03/24/15 126 lb 12.8 oz (57.516 kg)  02/25/15 129 lb (58.514 kg)  01/23/15 135 lb (61.236 kg)   Patient weight has increased by about a half a pound since I last saw her 2 weeks ago.   Patient reports oral intake as fair and is suffering from symptoms including Nausea/Vomiting. She states that there are no specific foods that cause her to to become more nauseated than others. Delayed gastric emptying is one of the most common complications s/p whipple.   She has started taking a calcium supplement with her Vitamin D. She was briefly tried on enzymes, but was unable to keep down .  Discussed with patient that Ensure Plus does have a significant amount of fat in it and it may not be well tolerated given her symptoms. She has not tried these since her surgery. Before ordering a case, I gave her one to try and will f/u next week to see how well she tolerated it.   On discussion with MD, patient has also been suffering from severe diarrhea which pt had not mentioned during our discussion. On return to talk to pt about this, she had already left the center. Pt had stated in past she was using her imodium and it greatly helped.  MD also had mentioned that pt is to undergo adjuvant chemotherapy d/t positive surgical margins. Pt will be at high risk for malnutrition given her low BMI and already impaired PO tolerance. She will likely best be able to tolerate small, low fat meals throughout the day.  Pt was already tried on prokinetics, but these were decreased today because of her diarrhea.  Per nurse, Her nausea medication was increased.   If diarrhea persists, can try adding  soluble fiber, but this would likely impair her overall intake which would not be ideal.  Pt did not seem to be too willing to talk today. Will f/u at her appointment in 1 week.   Burtis Junes RD, LDN Nutrition Pager: [phone] 07/18/2015 10:58 AM

## 2015-07-18 NOTE — Progress Notes (Signed)
LABS DRAWN

## 2015-07-19 LAB — CANCER ANTIGEN 19-9: CA 19 9: 49 U/mL — AB (ref 0–35)

## 2015-07-22 ENCOUNTER — Encounter: Payer: Self-pay | Admitting: Dietician

## 2015-07-22 NOTE — Progress Notes (Signed)
Briefly followed up with pt to assess tolerance to Ensure   Wt Readings from Last 10 Encounters:  07/18/15 113 lb 3.2 oz (51.347 kg)  07/04/15 112 lb 12.8 oz (51.166 kg)  06/27/15 112 lb 14.4 oz (51.211 kg)  05/13/15 120 lb (54.432 kg)  04/25/15 120 lb 3.2 oz (54.522 kg)  04/22/15 119 lb 12.8 oz (54.341 kg)  04/17/15 126 lb (57.153 kg)  03/24/15 126 lb 12.8 oz (57.516 kg)  02/25/15 129 lb (58.514 kg)  01/23/15 135 lb (61.236 kg)   Patient states that she was only able to drink a half of the Ensure bottle. She still states that everything is causing her to have diarrhea and she is not exactly sure if the diarrhea was made worse or not by the Ensure. She desired to hold off on receiving a case because she would like to see how she tolerates the other half.  Will follow up with pt at her next appointment Friday.   Burtis Junes RD, LDN Nutrition Pager: [phone] 07/22/2015 12:29 PM

## 2015-07-25 ENCOUNTER — Encounter: Payer: Self-pay | Admitting: Dietician

## 2015-07-25 ENCOUNTER — Encounter (HOSPITAL_BASED_OUTPATIENT_CLINIC_OR_DEPARTMENT_OTHER): Payer: Medicare Other | Admitting: Oncology

## 2015-07-25 VITALS — BP 152/78 | HR 74 | Temp 98.2°F | Resp 16 | Wt 113.7 lb

## 2015-07-25 DIAGNOSIS — C250 Malignant neoplasm of head of pancreas: Secondary | ICD-10-CM | POA: Diagnosis not present

## 2015-07-25 DIAGNOSIS — R600 Localized edema: Secondary | ICD-10-CM

## 2015-07-25 DIAGNOSIS — R195 Other fecal abnormalities: Secondary | ICD-10-CM

## 2015-07-25 MED ORDER — FUROSEMIDE 20 MG PO TABS: 20.0000 mg | ORAL_TABLET | Freq: Every day | ORAL | Status: DC | PRN

## 2015-07-25 NOTE — Progress Notes (Signed)
F/u with pt  Wt Readings from Last 10 Encounters:  07/25/15 113 lb 11.2 oz (51.574 kg)  07/18/15 113 lb 3.2 oz (51.347 kg)  07/04/15 112 lb 12.8 oz (51.166 kg)  06/27/15 112 lb 14.4 oz (51.211 kg)  05/13/15 120 lb (54.432 kg)  04/25/15 120 lb 3.2 oz (54.522 kg)  04/22/15 119 lb 12.8 oz (54.341 kg)  04/17/15 126 lb (57.153 kg)  03/24/15 126 lb 12.8 oz (57.516 kg)  02/25/15 129 lb (58.514 kg)   Patient weight has not changed since her last appointment  She states that, thankfully, her nausea and vomiting has resolved. She however is still suffering from diarrhea after almost every meal.   She says the Ensure I gave her to try DID give her diarrhea which is what was suspected.   It is interesting she is not suffering from n/v because on an in depth discussion with pt, she is eating many high fat foods.  Breakfast is eggs (with crisco/butter), bacon, cheese. She frequently eats beef and mentioned hamburger helper. She drinks whole milk. She experiences the diarrhea roughly 1-1.5 hours after eating.  She did say she had a few bites of cornflakes without experiencing diarrhea. Reccommended to pt that she dramatically reduce the fat in her diet for a few days and see if her diarrhea persists. We discussed avoiding high fat foods in her diet such as  pork, beef, cheese, egg yolks, nuts, whole milk, butter, crisco.   Reccommended she try starches/grains/carbs to see if they are better tolerated. Reccommended cereals, potatoes, rice, noodles, breads etc. Also discussed high pro, low fat sources such as turkey/Chicken.  She does have a hx of diabetes but reports she hasnt had issues with blood sugars recently. Her last CBG was WDL.   Glucerna has slightly less fat than the Ensure and RD gave pt sample to see if it is any better tolerated.   She states she is well aware of the need to stay hydrated and says she drinks fluid throughout the day.   Finally, reccommended she try a soluble fiber  supplement, such as a benefiber, to add bulk to her stools and hopefully reduce some of the diarrhea.   Pt seemed to be agreeable to try a lower fat diet for a few days.   Will f/u at her next appointment: 10/25  Burtis Junes RD, LDN Nutrition Pager: [phone] 07/25/2015 11:33 AM

## 2015-07-25 NOTE — Progress Notes (Signed)
Veronica Rival, NP [address] [place] [address]  Malignant neoplasm of head of pancreas (HCC)  Bilateral edema of lower extremity - Plan: furosemide (LASIX) 20 MG tablet  CURRENT THERAPY: Planning for therapy in the near future based upon symptoms today.  INTERVAL HISTORY: Veronica Stevenson 81 y.o. female returns for followup of Adenocarcinoma of the pancreas (T3N0M0) with positive ucinate margin, S/P whipple procedure by Dr. Eugenia Pancoast at [facility] on 06/05/2015.    Pancreatic cancer ([place])   04/17/2015 Tumor Marker Results for Veronica Stevenson, Veronica Stevenson (MRN [id number]) as of 07/25/2015 09:24  04/17/2015 08:57 CA 19-9: 701 (H)    04/18/2015 Imaging CT abd/pelvis- Severe intrahepatic and extrahepatic biliary ductal and pancreatic ductal dilatation is noted secondary to 3 cm pancreatic head mass consistent with malignancy.   04/23/2015 Pathology Results BILE DUCT BRUSHING (SPECIMEN 1 OF 1 COLLECTED 04/23/2015) NONDIAGNOSTIC MATERIAL.   04/23/2015 Procedure ERCP- Dr. Ardis Hughs   06/05/2015 Surgery Dr. Eugenia Pancoast at [place] procedure demonstrating a T3N0 pancreatic mass with positive ucinate margin   07/18/2015 Tumor Marker Results for Veronica Stevenson, Veronica Stevenson (MRN [id number]) as of 07/25/2015 09:24  07/18/2015 08:52 CA 19-9: 49 (H)     I personally reviewed and went over laboratory results with the patient.  The results are noted within this dictation.  She reports LE edema that is tender and keeping her awake at night.  She reports that she keeps them elevated when resting to help with dependent edema, but despite this conservative intervention, she notes no improvement.  Edema is B/L  She notes continued watery stools, 3-4 x per day.  We have attempted a number of interventions including decreasing Reglan to BID, holding her atorvastatin, and holding pancreatic enzymes given a lack of signs/symptoms of malabsorption in an attempt to help with loose stools.  I have messaged Dr. Ardis Hughs for some advice  or recommendations if he has any.  Otherwise, she denies any issues.  She notes the aforementioned are her two main issues.  If these are improved, "I would be very happy," she reports.   Past Medical History  Diagnosis Date  . Essential hypertension   . Hyperlipidemia   . Asthmatic bronchitis   . Chronic pancreatitis ([place])   . Osteoarthritis of knee   . Atypical ductal hyperplasia of right breast   . Vitamin D deficiency   . GERD (gastroesophageal reflux disease)   . Hyperparathyroidism ([place])   . Zollinger-Ellison syndrome   . Carotid artery disease ([place])     has Hypertension; Hypercholesteremia; Acute pancreatitis; HLD (hyperlipidemia); Essential hypertension; Hyperglycemia; Anemia; Dyspepsia; Elevated lipase; Abnormal magnetic resonance cholangiopancreatography (MRCP); Loss of weight; Abdominal pain; Obstructive jaundice; and Pancreatic cancer ([place]) on her problem list.     is allergic to codeine; omeprazole; and penicillins.  Current Outpatient Prescriptions on File Prior to Visit  Medication Sig Dispense Refill  . acetaminophen (TYLENOL) 500 MG tablet Take 500 mg by mouth every 6 (six) hours as needed for mild pain or moderate pain.    Marland Kitchen aspirin EC 81 MG tablet Take 81 mg by mouth every morning.    . Cholecalciferol (VITAMIN D) 2000 UNITS CAPS Take 1 capsule by mouth every morning.     Marland Kitchen losartan (COZAAR) 100 MG tablet Take 100 mg by mouth daily.     . metFORMIN (GLUCOPHAGE) 500 MG tablet Take 500 mg by mouth daily after lunch.     . metoprolol succinate (TOPROL-XL) 25 MG 24 hr tablet Take  25 mg by mouth every evening.     . ondansetron (ZOFRAN) 8 MG tablet Take 1 tablet (8 mg total) by mouth every 8 (eight) hours as needed for nausea or vomiting. 20 tablet 0  . oxyCODONE (OXY IR/ROXICODONE) 5 MG immediate release tablet Take 1 tablet (5 mg total) by mouth every 6 (six) hours as needed. 45 tablet 0  . atorvastatin (LIPITOR) 10 MG tablet Take 10 mg by mouth daily after lunch.      Veronica Stevenson Calcium (STOOL SOFTENER PO) Take 1 capsule by mouth daily.    . lipase/protease/amylase (CREON) 12000 UNITS CPEP capsule Take 1 capsule (12,000 Units total) by mouth 3 (three) times daily with meals. (Patient not taking: Reported on 07/18/2015) 270 capsule 3  . loperamide (IMODIUM A-D) 2 MG tablet Take 2 mg by mouth as needed for diarrhea or loose stools.    . metoCLOPramide (REGLAN) 10 MG tablet Take 10 mg by mouth 4 (four) times daily. And at night  0  . polyethylene glycol (MIRALAX / GLYCOLAX) packet Take 17 g by mouth daily.     No current facility-administered medications on file prior to visit.    Past Surgical History  Procedure Laterality Date  . Colonoscopy  2009    [place]  . Tonsillectomy    . Cataract extraction, bilateral Bilateral   . Parathyroidectomy    . Ovarian cyst removal    . Eus N/A 04/17/2015    Jacobs-1.7 cm x 2.5 cm mass in head of pancreas causing dilation of pancreatic duct and common bile duct.  . Ercp N/A 04/23/2015    HCW:CBJSEGBTDV, heterogeneous,indistinctly bordered mass in head of pancreas  . Sphincterotomy N/A 04/23/2015    Procedure: SPHINCTEROTOMY;  Surgeon: Daneil Dolin, MD;  Location: [facility];  Service: Endoscopy;  Laterality: N/A;  . Biliary stent placement N/A 04/23/2015    Procedure: BILIARY STENT PLACEMENT;  Surgeon: Daneil Dolin, MD;  Location: [facility];  Service: Endoscopy;  Laterality: N/A;  . Whipple procedure  August 15th 2016    Denies any headaches, dizziness, double vision, fevers, chills, night sweats, nausea, vomiting, constipation, chest pain, heart palpitations, shortness of breath, blood in stool, black tarry stool, urinary pain, urinary burning, urinary frequency, hematuria.   PHYSICAL EXAMINATION  ECOG PERFORMANCE STATUS: 1 - Symptomatic but completely ambulatory  Filed Vitals:   07/25/15 1001  BP: 152/78  Pulse: 74  Temp: 98.2 F (36.8 C)  Resp: 16    GENERAL:alert, no distress, well nourished, well  developed, comfortable, cooperative, smiling and unaccompanied SKIN: skin color, texture, turgor are normal, no rashes or significant lesions HEAD: Normocephalic, No masses, lesions, tenderness or abnormalities EYES: normal, PERRLA, EOMI, Conjunctiva are pink and non-injected EARS: External ears normal OROPHARYNX:lips, buccal mucosa, and tongue normal and mucous membranes are moist  NECK: supple, no adenopathy, trachea midline LYMPH:  no palpable lymphadenopathy, no hepatosplenomegaly BREAST:not examined LUNGS: clear to auscultation and percussion HEART: regular rate & rhythm, no murmurs and no gallops ABDOMEN:abdomen soft, non-tender and normal bowel sounds BACK: Back symmetric, no curvature., No CVA tenderness EXTREMITIES:less then 2 second capillary refill, no joint deformities, effusion, or inflammation, no skin discoloration, no cyanosis, positive findings:  edema B/L pre-tibial and pedal edema, 3+ pitting with tenderness to palpation.  No erythema, heat and negative Homan's sign.  NEURO: alert & oriented x 3 with fluent speech, no focal motor/sensory deficits, gait normal   LABORATORY DATA: CBC    Component Value Date/Time   WBC 7.1 07/18/2015  0852   WBC 7.9 10/31/2014 1347   RBC 3.77* 07/18/2015 0852   RBC 4.17 10/31/2014 1347   HGB 10.8* 07/18/2015 0852   HGB 12.2 10/31/2014 1347   HCT 32.3* 07/18/2015 0852   HCT 38.4 10/31/2014 1347   PLT 241 07/18/2015 0852   PLT 218 10/31/2014 1347   MCV 85.7 07/18/2015 0852   MCV 92 10/31/2014 1347   MCH 28.6 07/18/2015 0852   MCH 29.2 10/31/2014 1347   MCHC 33.4 07/18/2015 0852   MCHC 31.8* 10/31/2014 1347   RDW 14.4 07/18/2015 0852   RDW 13.8 10/31/2014 1347   LYMPHSABS 3.0 07/18/2015 0852   LYMPHSABS 2.4 10/31/2014 1347   MONOABS 0.4 07/18/2015 0852   MONOABS 0.6 10/31/2014 1347   EOSABS 0.2 07/18/2015 0852   EOSABS 0.1 10/31/2014 1347   BASOSABS 0.0 07/18/2015 0852   BASOSABS 0.0 10/31/2014 1347      Chemistry        Component Value Date/Time   NA 141 07/18/2015 0852   NA 142 10/31/2014 1347   K 3.0* 07/18/2015 0852   K 3.5 10/31/2014 1347   CL 107 07/18/2015 0852   CL 108* 10/31/2014 1347   CO2 27 07/18/2015 0852   CO2 26 10/31/2014 1347   BUN 15 07/18/2015 0852   BUN 19* 10/31/2014 1347   CREATININE 1.45* 07/18/2015 0852   CREATININE 0.95 01/23/2015 1452   CREATININE 1.10 10/31/2014 1347      Component Value Date/Time   CALCIUM 8.0* 07/18/2015 0852   CALCIUM 9.3 10/31/2014 1347   ALKPHOS 70 07/18/2015 0852   ALKPHOS 88 10/31/2014 1347   AST 35 07/18/2015 0852   AST 35 10/31/2014 1347   ALT 23 07/18/2015 0852   ALT 26 10/31/2014 1347   BILITOT 1.0 07/18/2015 0852   BILITOT 0.4 10/31/2014 1347      Results for Veronica Stevenson, Veronica Stevenson (MRN [id number]) as of 07/25/2015 09:24  Ref. Range 07/18/2015 08:52  CA 19-9 Latest Ref Range: 0-35 U/mL 49 (H)    PENDING LABS:   RADIOGRAPHIC STUDIES:  No results found.   PATHOLOGY:    ASSESSMENT AND PLAN:  Pancreatic cancer Adenocarcinoma of the pancreas (T3N0M0) with positive ucinate margin, S/P whipple procedure by Dr. Eugenia Pancoast at [facility] on 06/05/2015.  Oncology history developed.  Staging completed.  Watery stools x 3-4 times per day persist.  Interventions previously attempted: decreasing Reglan to BID, holding her atorvastatin, and holding pancreatic enzymes given a lack of signs/symptoms of malabsorption in an attempt to help with loose stools.  I have messaged Dr. Ardis Hughs for some advice or recommendations if he has any.  I will relay this information to the patient when received.  B/L LE edema.  She has tried conservative treatment with failure.  It is painful and interfering with her ability to sleep.  Rx for Lasix 20 mg daily PRN is given #15.  Hopefully this will help.  "How about I come back in 2 weeks."  I will have her come back in 1 week for follow-up as we are ready to start therapy very soon.  She does not have a port.  We will try  peripheral treatment first to see how well she tolerates treatment.  Dr. Ardis Hughs was nice enough to make some recommendations.  She was previously on pancreatic enzymes and she noted that her symptoms worsened.  We can always consider restarting this intervention.  We will recall her next week for PCR C. Diff and if negative, she can start  Imodium (OTC) scheduled 2 mg in AM and 2 mg in PM.    THERAPY PLAN:  We will continue with symptom management.  We have asked for additional GI assistance regarding her watery stools.  I suspect within 2 weeks, we will be starting treatment based upon her symptoms.  All questions were answered. The patient knows to call the clinic with any problems, questions or concerns. We can certainly see the patient much sooner if necessary.  Patient and plan discussed with Dr. Ancil Linsey and she is in agreement with the aforementioned.   This note is electronically signed by: Doy Mince 07/25/2015 4:26 PM

## 2015-07-25 NOTE — Patient Instructions (Signed)
[place] at [facility] Discharge Instructions  RECOMMENDATIONS MADE BY THE CONSULTANT AND ANY TEST RESULTS WILL BE SENT TO YOUR REFERRING PHYSICIAN.  Exam per Meriel Flavors. Return as scheduled.  Thank you for choosing [place] at [facility] to provide your oncology and hematology care.  To afford each patient quality time with our provider, please arrive at least 15 minutes before your scheduled appointment time.    You need to re-schedule your appointment should you arrive 10 or more minutes late.  We strive to give you quality time with our providers, and arriving late affects you and other patients whose appointments are after yours.  Also, if you no show three or more times for appointments you may be dismissed from the clinic at the providers discretion.     Again, thank you for choosing [facility].  Our hope is that these requests will decrease the amount of time that you wait before being seen by our physicians.       _____________________________________________________________  Should you have questions after your visit to [facility], please contact our office at [phone] between the hours of 8:30 a.m. and 4:30 p.m.  Voicemails left after 4:30 p.m. will not be returned until the following business day.  For prescription refill requests, have your pharmacy contact our office.

## 2015-07-25 NOTE — Assessment & Plan Note (Addendum)
Adenocarcinoma of the pancreas (T3N0M0) with positive ucinate margin, S/P whipple procedure by Dr. Eugenia Pancoast at [facility] on 06/05/2015.  Oncology history developed.  Staging completed.  Watery stools x 3-4 times per day persist.  Interventions previously attempted: decreasing Reglan to BID, holding her atorvastatin, and holding pancreatic enzymes given a lack of signs/symptoms of malabsorption in an attempt to help with loose stools.  I have messaged Dr. Ardis Hughs for some advice or recommendations if he has any.  I will relay this information to the patient when received.  B/L LE edema.  She has tried conservative treatment with failure.  It is painful and interfering with her ability to sleep.  Rx for Lasix 20 mg daily PRN is given #15.  Hopefully this will help.  "How about I come back in 2 weeks."  I will have her come back in 1 week for follow-up as we are ready to start therapy very soon.  She does not have a port.  We will try peripheral treatment first to see how well she tolerates treatment.  Dr. Ardis Hughs was nice enough to make some recommendations.  She was previously on pancreatic enzymes and she noted that her symptoms worsened.  We can always consider restarting this intervention.  We will recall her next week for PCR C. Diff and if negative, she can start Imodium (OTC) scheduled 2 mg in AM and 2 mg in PM.

## 2015-07-28 ENCOUNTER — Telehealth (HOSPITAL_COMMUNITY): Payer: Self-pay | Admitting: *Deleted

## 2015-07-28 NOTE — Telephone Encounter (Signed)
Patient contacted and will come tomorrow morning to try to collect stool specimen. She states that the loose stools are usually first thing in the morning and then nothing after noon.

## 2015-07-29 ENCOUNTER — Other Ambulatory Visit (HOSPITAL_COMMUNITY): Payer: Medicare Other

## 2015-07-29 NOTE — Patient Instructions (Signed)
Clostridium Difficile Testing WHY AM I HAVING THIS TEST? This test is performed to check for Clostridium difficile bacteria in your stool. Being infected with this toxin can result in damage to the lining of your colon and may lead to colitis. Your health care provider may recommend this test if you have had diarrhea and have been taking antibiotic medicine for more than 5 days. The test may also be done if you have a weakened defense system (immune system) and you develop diarrhea, even if you are not taking antibiotics. WHAT KIND OF SAMPLE IS TAKEN? A stool sample is required for this test. This stool sample may be obtained by:  Proctoscopy or colonoscopy.  Collection at home in a sterile container given to you by the lab.  Collection from an incontinence pad. WILL I NEED TO COLLECT SAMPLES AT HOME? You may be asked to collect a stool sample at home. Follow your health care provider's instructions about how to collect the sample. When collecting a sample at home, make sure that you:  Wash your hands before collecting the sample.  Do not mix urine or toilet paper with the sample.  Do not retrieve the stool sample from the toilet.  Refrigerate the sample if it cannot be returned to the lab right away.  Wash your hands after collecting the sample. HOW DO I PREPARE FOR THE TEST? There is no preparation required for this test. If you are asked to collect a stool sample at home, your health care provider will give you the supplies and instructions you need. HOW ARE MY TEST RESULTS REPORTED? Your test results will be reported as either positive or negative. It is your responsibility to obtain your test results. Ask the lab or department performing the test when and how you will get your results. WHAT DO THE RESULTS MEAN? A negative test result means that there was no Clostridium difficile toxin identified in your stool. A positive test result means that you have Clostridium difficile colitis  or another antibiotic-related colitis. Talk with your health care provider to discuss your results, treatment options, and if necessary, the need for more tests. Talk with your health care provider if you have any questions about your results.   This information is not intended to replace advice given to you by your health care provider. Make sure you discuss any questions you have with your health care provider.   Document Released: 10/20/2004 Document Revised: 10/18/2014 Document Reviewed: 02/20/2014 Elsevier Interactive Patient Education [employer].

## 2015-07-31 DIAGNOSIS — C250 Malignant neoplasm of head of pancreas: Secondary | ICD-10-CM | POA: Diagnosis not present

## 2015-07-31 LAB — CLOSTRIDIUM DIFFICILE BY PCR: Toxigenic C. Difficile by PCR: NEGATIVE

## 2015-07-31 NOTE — Progress Notes (Signed)
Patient returned stool specimen for c. diff

## 2015-08-05 ENCOUNTER — Ambulatory Visit (HOSPITAL_COMMUNITY): Payer: Medicare Other | Admitting: Hematology & Oncology

## 2015-08-05 NOTE — Progress Notes (Signed)
Veronica Rival, NP [address] [place] [address]  Malignant neoplasm of head of pancreas (HCC)  Bilateral edema of lower extremity - Plan: furosemide (LASIX) 20 MG tablet  CURRENT THERAPY: Planning for therapy in the near future based upon symptoms today.  INTERVAL HISTORY: Veronica Stevenson 81 y.o. female returns for followup of Adenocarcinoma of the pancreas (T3N0M0) with positive ucinate margin, S/P whipple procedure by Dr. Eugenia Pancoast at [facility] on 06/05/2015.    Pancreatic cancer (Veronica Stevenson)   04/17/2015 Tumor Marker Results for LEATTA, ALEWINE (MRN [id number]) as of 07/25/2015 09:24  04/17/2015 08:57 CA 19-9: 701 (H)    04/18/2015 Imaging CT abd/pelvis- Severe intrahepatic and extrahepatic biliary ductal and pancreatic ductal dilatation is noted secondary to 3 cm pancreatic head mass consistent with malignancy.   04/23/2015 Pathology Results BILE DUCT BRUSHING (SPECIMEN 1 OF 1 COLLECTED 04/23/2015) NONDIAGNOSTIC MATERIAL.   04/23/2015 Procedure ERCP- Dr. Ardis Hughs   06/05/2015 Surgery Dr. Eugenia Pancoast at [place] procedure demonstrating a T3N0 pancreatic mass with positive ucinate margin   07/18/2015 Tumor Marker Results for Veronica Stevenson (MRN [id number]) as of 07/25/2015 09:24  07/18/2015 08:52 CA 19-9: 49 (H)     I personally reviewed and went over laboratory results with the patient.  The results are noted within this dictation.  She is 2 months out from surgery and therefore, we need to move on with single-agent gemcitabine.  I reviewed this treatment option.  I reviewed the risks, benefits, side effects, and alternative to treatment.  She will need chemotherapy teaching prior to the start of therapy.  She is agreeable to this plan and timeframe.    She continues with loose stools.  Her C. Diff was negative.  Dr. Ardis Hughs was helpful with suggestions.  We passed this information on to Ms. Grigg, but she did not receive the nursing message.  As a result, she learned today of the  information.  Hopefully her stools improve.  She is drinking 3-4 glasses of H2O daily.  I have asked her to double that, at least.  Her weight is down 3 lbs since 2 weeks ago.  I have reviewed her weight history.  Pre-op, her weight was about 120 lbs.  Post-op, her weight dropped to 112 lbs and she has been stable there since.  Then today, her weight is noted to be 109 lbs.  She notes that her appetite is strong.  She continues with B/L LE edema.  I am concerned about the relationship of her edema and her albumin level.  She is followed by Burtis Junes.   Past Medical History  Diagnosis Date  . Essential hypertension   . Hyperlipidemia   . Asthmatic bronchitis   . Chronic pancreatitis ([place])   . Osteoarthritis of knee   . Atypical ductal hyperplasia of right breast   . Vitamin D deficiency   . GERD (gastroesophageal reflux disease)   . Hyperparathyroidism ([place])   . Zollinger-Ellison syndrome   . Carotid artery disease ([place])     has Hypertension; Hypercholesteremia; Acute pancreatitis; HLD (hyperlipidemia); Essential hypertension; Hyperglycemia; Anemia; Dyspepsia; Elevated lipase; Abnormal magnetic resonance cholangiopancreatography (MRCP); Loss of weight; Abdominal pain; Obstructive jaundice; and Pancreatic cancer ([place]) on her problem list.     is allergic to codeine; omeprazole; and penicillins.  Current Outpatient Prescriptions on File Prior to Visit  Medication Sig Dispense Refill  . acetaminophen (TYLENOL) 500 MG tablet Take 500 mg by mouth every 6 (six) hours as needed  for mild pain or moderate pain.    Marland Kitchen aspirin EC 81 MG tablet Take 81 mg by mouth every morning.    Marland Kitchen atorvastatin (LIPITOR) 10 MG tablet Take 10 mg by mouth daily after lunch.     . Cholecalciferol (VITAMIN D) 2000 UNITS CAPS Take 1 capsule by mouth every morning.     Mariane Stevenson Calcium (STOOL SOFTENER PO) Take 1 capsule by mouth daily.    Marland Kitchen loperamide (IMODIUM A-D) 2 MG tablet Take 2 mg by mouth as needed for  diarrhea or loose stools.    Marland Kitchen losartan (COZAAR) 100 MG tablet Take 100 mg by mouth daily.     . metFORMIN (GLUCOPHAGE) 500 MG tablet Take 500 mg by mouth daily after lunch.     . metoCLOPramide (REGLAN) 10 MG tablet Take 10 mg by mouth 4 (four) times daily. And at night  0  . metoprolol succinate (TOPROL-XL) 25 MG 24 hr tablet Take 25 mg by mouth every evening.     Marland Kitchen oxyCODONE (OXY IR/ROXICODONE) 5 MG immediate release tablet Take 1 tablet (5 mg total) by mouth every 6 (six) hours as needed. 45 tablet 0  . potassium chloride 20 MEQ/15ML (10%) SOLN Take 40 mEq by mouth daily.     . lipase/protease/amylase (CREON) 12000 UNITS CPEP capsule Take 1 capsule (12,000 Units total) by mouth 3 (three) times daily with meals. (Patient not taking: Reported on 07/18/2015) 270 capsule 3  . ondansetron (ZOFRAN) 8 MG tablet Take 1 tablet (8 mg total) by mouth every 8 (eight) hours as needed for nausea or vomiting. (Patient not taking: Reported on 08/06/2015) 20 tablet 0  . polyethylene glycol (MIRALAX / GLYCOLAX) packet Take 17 g by mouth daily.     No current facility-administered medications on file prior to visit.    Past Surgical History  Procedure Laterality Date  . Colonoscopy  2009    [place]  . Tonsillectomy    . Cataract extraction, bilateral Bilateral   . Parathyroidectomy    . Ovarian cyst removal    . Eus N/A 04/17/2015    Jacobs-1.7 cm x 2.5 cm mass in head of pancreas causing dilation of pancreatic duct and common bile duct.  . Ercp N/A 04/23/2015    VWU:JWJXBJYNWG, heterogeneous,indistinctly bordered mass in head of pancreas  . Sphincterotomy N/A 04/23/2015    Procedure: SPHINCTEROTOMY;  Surgeon: Daneil Dolin, MD;  Location: [facility];  Service: Endoscopy;  Laterality: N/A;  . Biliary stent placement N/A 04/23/2015    Procedure: BILIARY STENT PLACEMENT;  Surgeon: Daneil Dolin, MD;  Location: [facility];  Service: Endoscopy;  Laterality: N/A;  . Whipple procedure  August 15th 2016    Denies  any headaches, dizziness, double vision, fevers, chills, night sweats, nausea, vomiting, constipation, chest pain, heart palpitations, shortness of breath, blood in stool, black tarry stool, urinary pain, urinary burning, urinary frequency, hematuria.   PHYSICAL EXAMINATION  ECOG PERFORMANCE STATUS: 1 - Symptomatic but completely ambulatory  Filed Vitals:   08/06/15 0958  BP: 127/56  Pulse: 79  Temp: 98 F (36.7 C)  Resp: 18    GENERAL:alert, no distress, well nourished, well developed, comfortable, cooperative, smiling and unaccompanied SKIN: skin color, texture, turgor are normal, no rashes or significant lesions HEAD: Normocephalic, No masses, lesions, tenderness or abnormalities EYES: normal, PERRLA, EOMI, Conjunctiva are pink and non-injected EARS: External ears normal OROPHARYNX:lips, buccal mucosa, and tongue normal and mucous membranes are moist  NECK: supple, no adenopathy, trachea midline LYMPH:  no palpable lymphadenopathy, no hepatosplenomegaly BREAST:not examined LUNGS: not examined HEART: not examined ABDOMEN:abdomen soft, non-tender and normal bowel sounds BACK: Back symmetric, no curvature., No CVA tenderness EXTREMITIES:less then 2 second capillary refill, no joint deformities, effusion, or inflammation, no skin discoloration, no cyanosis, positive findings:  edema B/L pre-tibial and pedal edema, 3+ pitting with tenderness to palpation.  No erythema, heat and negative Homan's sign.  NEURO: alert & oriented x 3 with fluent speech, no focal motor/sensory deficits, gait normal   LABORATORY DATA: CBC    Component Value Date/Time   WBC 7.1 07/18/2015 0852   WBC 7.9 10/31/2014 1347   RBC 3.77* 07/18/2015 0852   RBC 4.17 10/31/2014 1347   HGB 10.8* 07/18/2015 0852   HGB 12.2 10/31/2014 1347   HCT 32.3* 07/18/2015 0852   HCT 38.4 10/31/2014 1347   PLT 241 07/18/2015 0852   PLT 218 10/31/2014 1347   MCV 85.7 07/18/2015 0852   MCV 92 10/31/2014 1347   MCH 28.6  07/18/2015 0852   MCH 29.2 10/31/2014 1347   MCHC 33.4 07/18/2015 0852   MCHC 31.8* 10/31/2014 1347   RDW 14.4 07/18/2015 0852   RDW 13.8 10/31/2014 1347   LYMPHSABS 3.0 07/18/2015 0852   LYMPHSABS 2.4 10/31/2014 1347   MONOABS 0.4 07/18/2015 0852   MONOABS 0.6 10/31/2014 1347   EOSABS 0.2 07/18/2015 0852   EOSABS 0.1 10/31/2014 1347   BASOSABS 0.0 07/18/2015 0852   BASOSABS 0.0 10/31/2014 1347      Chemistry      Component Value Date/Time   NA 141 07/18/2015 0852   NA 142 10/31/2014 1347   K 3.0* 07/18/2015 0852   K 3.5 10/31/2014 1347   CL 107 07/18/2015 0852   CL 108* 10/31/2014 1347   CO2 27 07/18/2015 0852   CO2 26 10/31/2014 1347   BUN 15 07/18/2015 0852   BUN 19* 10/31/2014 1347   CREATININE 1.45* 07/18/2015 0852   CREATININE 0.95 01/23/2015 1452   CREATININE 1.10 10/31/2014 1347      Component Value Date/Time   CALCIUM 8.0* 07/18/2015 0852   CALCIUM 9.3 10/31/2014 1347   ALKPHOS 70 07/18/2015 0852   ALKPHOS 88 10/31/2014 1347   AST 35 07/18/2015 0852   AST 35 10/31/2014 1347   ALT 23 07/18/2015 0852   ALT 26 10/31/2014 1347   BILITOT 1.0 07/18/2015 0852   BILITOT 0.4 10/31/2014 1347      Results for KELLYN, MCCARY (MRN [id number]) as of 07/25/2015 09:24  Ref. Range 07/18/2015 08:52  CA 19-9 Latest Ref Range: 0-35 U/mL 49 (H)    PENDING LABS:   RADIOGRAPHIC STUDIES:  No results found.   PATHOLOGY:    ASSESSMENT AND PLAN:  Pancreatic cancer Adenocarcinoma of the pancreas (T3N0M0) with positive ucinate margin, S/P whipple procedure by Dr. Eugenia Pancoast at [facility] on 06/05/2015.  Oncology history developed.  Staging completed.  Watery stools x 3-4 times per day persist.  Interventions previously attempted: decreasing Reglan to BID, holding her atorvastatin, and holding pancreatic enzymes given a lack of signs/symptoms of malabsorption in an attempt to help with loose stools.  I have messaged Dr. Ardis Hughs for some advice or recommendations and he was  gracious enough to provide some recommendations.  Unfortunately, the patient did not get our message about the recommendations.  As a result, she learned of them today.  She will institute Imodium 2 tabs in AM and PM scheduled.  She reports strong appetite.  Her weight is down 3 lbs.  She is followed by Burtis Junes and I have messaged him to keep him updated.  Her LE edema persists despite a 7 day course of Lasix.  She notes that it did improve with Lasix.  I am concerned about her LE edema and its likely association with her albumin level.  Of note, her protein is WNL, at lower limits.  I have refilled her Lasix Rx.  She reports that she drinks 3-4 glasses of H2O daily.  She is advised to at least double that amount.  I expressed the importance of Hydration.  She does not have a port.  We will try peripheral treatment first to see how well she tolerates treatment.  I will get her set-up for chemotherapy teaching and plan on starting treatment within 2 weeks.    We will see her back on day 1 of cycle 1.    THERAPY PLAN:  We will start planning for future systemic chemotherapy.    All questions were answered. The patient knows to call the clinic with any problems, questions or concerns. We can certainly see the patient much sooner if necessary.  Patient and plan discussed with Dr. Ancil Linsey and she is in agreement with the aforementioned.   This note is electronically signed by: Doy Mince 08/06/2015 11:56 AM

## 2015-08-05 NOTE — Assessment & Plan Note (Addendum)
Adenocarcinoma of the pancreas (T3N0M0) with positive ucinate margin, S/P whipple procedure by Dr. Eugenia Pancoast at [facility] on 06/05/2015.  Oncology history developed.  Staging completed.  Watery stools x 3-4 times per day persist.  Interventions previously attempted: decreasing Reglan to BID, holding her atorvastatin, and holding pancreatic enzymes given a lack of signs/symptoms of malabsorption in an attempt to help with loose stools.  I have messaged Dr. Ardis Hughs for some advice or recommendations and he was gracious enough to provide some recommendations.  Unfortunately, the patient did not get our message about the recommendations.  As a result, she learned of them today.  She will institute Imodium 2 tabs in AM and PM scheduled.  She reports strong appetite.  Her weight is down 3 lbs.  She is followed by Burtis Junes and I have messaged him to keep him updated.  Her LE edema persists despite a 7 day course of Lasix.  She notes that it did improve with Lasix.  I am concerned about her LE edema and its likely association with her albumin level.  Of note, her protein is WNL, at lower limits.  I have refilled her Lasix Rx.  She reports that she drinks 3-4 glasses of H2O daily.  She is advised to at least double that amount.  I expressed the importance of Hydration.  She does not have a port.  We will try peripheral treatment first to see how well she tolerates treatment.  I will get her set-up for chemotherapy teaching and plan on starting treatment within 2 weeks.  I have built Gemcitabine on days 1, 8 and 15 every 28 days.  We may need to delete day 15 from treatment plan depending on tolerability and if her counts hold up to treatment.    We will see her back on day 1 of cycle 1.

## 2015-08-06 ENCOUNTER — Encounter (HOSPITAL_COMMUNITY): Payer: Self-pay | Admitting: Oncology

## 2015-08-06 ENCOUNTER — Other Ambulatory Visit (HOSPITAL_COMMUNITY): Payer: Self-pay | Admitting: Oncology

## 2015-08-06 ENCOUNTER — Encounter (HOSPITAL_BASED_OUTPATIENT_CLINIC_OR_DEPARTMENT_OTHER): Payer: Medicare Other | Admitting: Oncology

## 2015-08-06 VITALS — BP 127/56 | HR 79 | Temp 98.0°F | Resp 18 | Wt 109.6 lb

## 2015-08-06 DIAGNOSIS — R634 Abnormal weight loss: Secondary | ICD-10-CM

## 2015-08-06 DIAGNOSIS — R600 Localized edema: Secondary | ICD-10-CM | POA: Diagnosis not present

## 2015-08-06 DIAGNOSIS — C250 Malignant neoplasm of head of pancreas: Secondary | ICD-10-CM

## 2015-08-06 DIAGNOSIS — R195 Other fecal abnormalities: Secondary | ICD-10-CM | POA: Diagnosis not present

## 2015-08-06 MED ORDER — FUROSEMIDE 20 MG PO TABS: 20.0000 mg | ORAL_TABLET | Freq: Every day | ORAL | Status: DC | PRN

## 2015-08-06 NOTE — Patient Instructions (Signed)
.  [place] at [facility] Discharge Instructions  RECOMMENDATIONS MADE BY THE CONSULTANT AND ANY TEST RESULTS WILL BE SENT TO YOUR REFERRING PHYSICIAN.  Chemo teaching for Gemcitabine Chemo in 1-2 weeks Imodium instructions --2 mg twice a day  Thank you for choosing [place] at [facility] to provide your oncology and hematology care.  To afford each patient quality time with our provider, please arrive at least 15 minutes before your scheduled appointment time.    You need to re-schedule your appointment should you arrive 10 or more minutes late.  We strive to give you quality time with our providers, and arriving late affects you and other patients whose appointments are after yours.  Also, if you no show three or more times for appointments you may be dismissed from the clinic at the providers discretion.     Again, thank you for choosing [facility].  Our hope is that these requests will decrease the amount of time that you wait before being seen by our physicians.       _____________________________________________________________  Should you have questions after your visit to [facility], please contact our office at [phone] between the hours of 8:30 a.m. and 4:30 p.m.  Voicemails left after 4:30 p.m. will not be returned until the following business day.  For prescription refill requests, have your pharmacy contact our office.

## 2015-08-06 NOTE — Patient Instructions (Addendum)
[facility]   CHEMOTHERAPY INSTRUCTIONS  Premeds: Zofran - for nausea/vomiting prevention/reduction. Dexamethasone - steroid - given to reduce the risk of you having an allergic type reaction to the chemotherapy. Dex can cause you to feel energized, nervous/anxious/jittery, make you have trouble sleeping, and/or make you feel hot/flushed in the face/neck and/or look pink/red in the face/neck. These side effects will pass as the Dex wears off. (takes 30 minutes to infuse)   Gemcitabine - Side Effects  Bone marrow suppression (lowers white blood cells (fight infection), lowers red blood cells (make up your blood), lowers platelets (help blood to clot)   Nausea/vomiting  Fever  Flu-like symptoms - body aches, chills  Rash  It take 30 minutes for Gemzar to infuse.   POTENTIAL SIDE EFFECTS OF TREATMENT: Increased Susceptibility to Infection, Vomiting, Constipation, Hair Thinning, Changes in Character of Skin and Nails (brittleness, dryness,etc.), Bone Marrow Suppression and Nausea    EDUCATIONAL MATERIALS GIVEN AND REVIEWED: Chemotherapy and You booklet Specific Instructions Sheets: Gemcitabine, Zofran, Imodium   SELF CARE ACTIVITIES WHILE ON CHEMOTHERAPY: Increase your fluid intake 48 hours prior to treatment and drink at least 2 quarts per day after treatment., No alcohol intake., No aspirin or other medications unless approved by your oncologist., Eat foods that are light and easy to digest., Eat foods at cold or room temperature., No fried, fatty, or spicy foods immediately before or after treatment., Have teeth cleaned professionally before starting treatment. Keep dentures and partial plates clean., Use soft toothbrush and do not use mouthwashes that contain alcohol. Biotene is a good mouthwash that is available at most pharmacies or may be ordered by calling [phone]., Use warm salt water gargles (1 teaspoon salt per 1 quart warm water)  before and after meals and at bedtime. Or you may rinse with 2 tablespoons of three -percent hydrogen peroxide mixed in eight ounces of water., Always use sunscreen with SPF (Sun Protection Factor) of 30 or higher., Use your nausea medication as directed to prevent nausea., Use your stool softener or laxative as directed to prevent constipation. and Use your anti-diarrheal medication as directed to stop diarrhea.  Please wash your hands for at least 30 seconds using warm soapy water. Handwashing is the #1 way to prevent the spread of germs. Stay away from sick people or people who are getting over a cold. If you develop respiratory systems such as green/yellow mucus production or productive cough or persistent cough let us know and we will see if you need an antibiotic. It is a good idea to keep a pair of gloves on when going into grocery stores/[employer] to decrease your risk of coming into contact with germs on the carts, etc. Carry alcohol hand gel with you at all times and use it frequently if out in public. All foods need to be cooked thoroughly. No raw foods. No medium or undercooked meats, eggs. If your food is cooked medium well, it does not need to be hot pink or saturated with bloody liquid at all. Vegetables and fruits need to be washed/rinsed under the faucet with a dish detergent before being consumed. You can eat raw fruits and vegetables unless we tell you otherwise but it would be best if you cooked them or bought frozen. Do not eat off of salad bars or hot bars unless you really trust the cleanliness of the restaurant. If you need dental work, please let Dr. Whitney Muse know before you go for your appointment so that  we can coordinate the best possible time for you in regards to your chemo regimen. You need to also let your dentist know that you are actively taking chemo. We may need to do labs prior to your dental appointment. We also want your bowels moving at least every other day. If this is not  happening, we need to know so that we can get you on a bowel regimen to help you go.      MEDICATIONS: You have been given prescriptions for the following medications:  Zofran 8mg  tablet. Take 1 tablet every 8 hours as needed for nausea/vomiting. (this can constipate)   Over-the-Counter Meds:  Miralax 1 capful in 8 oz of fluid daily. May increase to two times a day if needed. This is a stool softener. If this doesn't work proceed you can add:  Senokot S  - start with 1 tablet two times a day and increase to 4 tablets two times a day if needed. (total of 8 tablets in a 24 hour period). This is a stimulant laxative.   Call us if this does not help your bowels move.   Imodium 2mg  capsule. Take 2 capsules after the 1st loose stool and then 1 capsule every 2 hours until you go a total of 12 hours without having a loose stool. Call the [facility] if loose stools continue.    SYMPTOMS TO REPORT AS SOON AS POSSIBLE AFTER TREATMENT:  FEVER GREATER THAN 100.5 F  CHILLS WITH OR WITHOUT FEVER  NAUSEA AND VOMITING THAT IS NOT CONTROLLED WITH YOUR NAUSEA MEDICATION  UNUSUAL SHORTNESS OF BREATH  UNUSUAL BRUISING OR BLEEDING  TENDERNESS IN MOUTH AND THROAT WITH OR WITHOUT PRESENCE OF ULCERS  URINARY PROBLEMS  BOWEL PROBLEMS  UNUSUAL RASH    Wear comfortable clothing and clothing appropriate for easy access to any Portacath or PICC line. Let us know if there is anything that we can do to make your therapy better!      I have been informed and understand all of the instructions given to me and have received a copy. I have been instructed to call the clinic [phone] or my family physician as soon as possible for continued medical care, if indicated. I do not have any more questions at this time but understand that I may call the [facility] or the Patient Navigator at [phone] during office hours should I have questions or need assistance in obtaining follow-up  care.           Gemcitabine injection What is this medicine? GEMCITABINE (jem SIT a been) is a chemotherapy drug. This medicine is used to treat many types of cancer like breast cancer, lung cancer, pancreatic cancer, and ovarian cancer. This medicine may be used for other purposes; ask your health care provider or pharmacist if you have questions. What should I tell my health care provider before I take this medicine? They need to know if you have any of these conditions: -blood disorders -infection -kidney disease -liver disease -recent or ongoing radiation therapy -an unusual or allergic reaction to gemcitabine, other chemotherapy, other medicines, foods, dyes, or preservatives -pregnant or trying to get pregnant -breast-feeding How should I use this medicine? This drug is given as an infusion into a vein. It is administered in a hospital or clinic by a specially trained health care professional. Talk to your pediatrician regarding the use of this medicine in children. Special care may be needed. Overdosage: If you think you have taken  too much of this medicine contact a poison control center or emergency room at once. NOTE: This medicine is only for you. Do not share this medicine with others. What if I miss a dose? It is important not to miss your dose. Call your doctor or health care professional if you are unable to keep an appointment. What may interact with this medicine? -medicines to increase blood counts like filgrastim, pegfilgrastim, sargramostim -some other chemotherapy drugs like cisplatin -vaccines Talk to your doctor or health care professional before taking any of these medicines: -acetaminophen -aspirin -ibuprofen -ketoprofen -naproxen This list may not describe all possible interactions. Give your health care provider a list of all the medicines, herbs, non-prescription drugs, or dietary supplements you use. Also tell them if you smoke, drink alcohol, or  use illegal drugs. Some items may interact with your medicine. What should I watch for while using this medicine? Visit your doctor for checks on your progress. This drug may make you feel generally unwell. This is not uncommon, as chemotherapy can affect healthy cells as well as cancer cells. Report any side effects. Continue your course of treatment even though you feel ill unless your doctor tells you to stop. In some cases, you may be given additional medicines to help with side effects. Follow all directions for their use. Call your doctor or health care professional for advice if you get a fever, chills or sore throat, or other symptoms of a cold or flu. Do not treat yourself. This drug decreases your body's ability to fight infections. Try to avoid being around people who are sick. This medicine may increase your risk to bruise or bleed. Call your doctor or health care professional if you notice any unusual bleeding. Be careful brushing and flossing your teeth or using a toothpick because you may get an infection or bleed more easily. If you have any dental work done, tell your dentist you are receiving this medicine. Avoid taking products that contain aspirin, acetaminophen, ibuprofen, naproxen, or ketoprofen unless instructed by your doctor. These medicines may hide a fever. Women should inform their doctor if they wish to become pregnant or think they might be pregnant. There is a potential for serious side effects to an unborn child. Talk to your health care professional or pharmacist for more information. Do not breast-feed an infant while taking this medicine. What side effects may I notice from receiving this medicine? Side effects that you should report to your doctor or health care professional as soon as possible: -allergic reactions like skin rash, itching or hives, swelling of the face, lips, or tongue -low blood counts - this medicine may decrease the number of white blood cells, red  blood cells and platelets. You may be at increased risk for infections and bleeding. -signs of infection - fever or chills, cough, sore throat, pain or difficulty passing urine -signs of decreased platelets or bleeding - bruising, pinpoint red spots on the skin, black, tarry stools, blood in the urine -signs of decreased red blood cells - unusually weak or tired, fainting spells, lightheadedness -breathing problems -chest pain -mouth sores -nausea and vomiting -pain, swelling, redness at site where injected -pain, tingling, numbness in the hands or feet -stomach pain -swelling of ankles, feet, hands -unusual bleeding Side effects that usually do not require medical attention (report to your doctor or health care professional if they continue or are bothersome): -constipation -diarrhea -hair loss -loss of appetite -stomach upset This list may not describe all possible side  effects. Call your doctor for medical advice about side effects. You may report side effects to FDA at 1-800-FDA-1088. Where should I keep my medicine? This drug is given in a hospital or clinic and will not be stored at home. NOTE: This sheet is a summary. It may not cover all possible information. If you have questions about this medicine, talk to your doctor, pharmacist, or health care provider.    2016, [employer]. (2008-02-06 18:45:54) Ondansetron injection What is this medicine? ONDANSETRON (on DAN se tron) is used to treat nausea and vomiting caused by chemotherapy. It is also used to prevent or treat nausea and vomiting after surgery. This medicine may be used for other purposes; ask your health care provider or pharmacist if you have questions. What should I tell my health care provider before I take this medicine? They need to know if you have any of these conditions: -heart disease -history of irregular heartbeat -liver disease -low levels of magnesium or potassium in the blood -an unusual  or allergic reaction to ondansetron, granisetron, other medicines, foods, dyes, or preservatives -pregnant or trying to get pregnant -breast-feeding How should I use this medicine? This medicine is for infusion into a vein. It is given by a health care professional in a hospital or clinic setting. Talk to your pediatrician regarding the use of this medicine in children. Special care may be needed. Overdosage: If you think you have taken too much of this medicine contact a poison control center or emergency room at once. NOTE: This medicine is only for you. Do not share this medicine with others. What if I miss a dose? This does not apply. What may interact with this medicine? Do not take this medicine with any of the following medications: -apomorphine -certain medicines for fungal infections like fluconazole, itraconazole, ketoconazole, posaconazole, voriconazole -cisapride -dofetilide -dronedarone -pimozide -thioridazine -ziprasidone This medicine may also interact with the following medications: -carbamazepine -certain medicines for depression, anxiety, or psychotic disturbances -fentanyl -linezolid -MAOIs like Carbex, Eldepryl, Marplan, Nardil, and Parnate -methylene blue (injected into a vein) -other medicines that prolong the QT interval (cause an abnormal heart rhythm) -phenytoin -rifampicin -tramadol This list may not describe all possible interactions. Give your health care provider a list of all the medicines, herbs, non-prescription drugs, or dietary supplements you use. Also tell them if you smoke, drink alcohol, or use illegal drugs. Some items may interact with your medicine. What should I watch for while using this medicine? Your condition will be monitored carefully while you are receiving this medicine. What side effects may I notice from receiving this medicine? Side effects that you should report to your doctor or health care professional as soon as  possible: -allergic reactions like skin rash, itching or hives, swelling of the face, lips, or tongue -breathing problems -confusion -dizziness -fast or irregular heartbeat -feeling faint or lightheaded, falls -fever and chills -loss of balance or coordination -seizures -sweating -swelling of the hands and feet -tightness in the chest -tremors -unusually weak or tired Side effects that usually do not require medical attention (report to your doctor or health care professional if they continue or are bothersome): -constipation or diarrhea -headache This list may not describe all possible side effects. Call your doctor for medical advice about side effects. You may report side effects to FDA at 1-800-FDA-1088. Where should I keep my medicine? This drug is given in a hospital or clinic and will not be stored at home. NOTE: This sheet is a summary. It  may not cover all possible information. If you have questions about this medicine, talk to your doctor, pharmacist, or health care provider.    2016, [employer] Standard. (2013-07-04 16:18:28) Loperamide tablets or capsules What is this medicine? LOPERAMIDE (loe PER a mide) is used to treat diarrhea. This medicine may be used for other purposes; ask your health care provider or pharmacist if you have questions. What should I tell my health care provider before I take this medicine? They need to know if you have any of these conditions: -a black or bloody stool -bacterial food poisoning -colitis or mucus in your stool -currently taking an antibiotic medication for an infection -fever -liver disease -severe abdominal pain, swelling or bulging -an unusual or allergic reaction to loperamide, other medicines, foods, dyes, or preservatives -pregnant or trying to get pregnant -breast-feeding How should I use this medicine? Take this medicine by mouth with a glass of water. Follow the directions on the prescription label. Take your  doses at regular intervals. Do not take your medicine more often than directed. Talk to your pediatrician regarding the use of this medicine in children. Special care may be needed. Overdosage: If you think you have taken too much of this medicine contact a poison control center or emergency room at once. NOTE: This medicine is only for you. Do not share this medicine with others. What if I miss a dose? This does not apply. This medicine is not for regular use. Only take this medicine while you continue to have loose bowel movements. Do not take more medicine than recommended by the packaging label or by your healthcare professional. What may interact with this medicine? Do not take this medicine with any of the following medications: - alosetron This medicine may also interact with the following medications: -cimetidine -clarithromycin -erythromycin -gemfibrozil -itraconazole -ketoconazole -quinidine -quinine -ranitidine -ritonavir -saquinavir This list may not describe all possible interactions. Give your health care provider a list of all the medicines, herbs, non-prescription drugs, or dietary supplements you use. Also tell them if you smoke, drink alcohol, or use illegal drugs. Some items may interact with your medicine. What should I watch for while using this medicine? Do not take this medicine for more than 2 days without asking your doctor or health care professional. Do not use doses higher than those prescribed by your doctor or listed on the label. Check with your doctor or health care professional right away if you develop a fever, severe abdominal pain, swelling or bulging, or if you have have bloody/black diarrhea or stools. You may get drowsy or dizzy. Do not drive, use machinery, or do anything that needs mental alertness until you know how this medicine affects you. Do not stand or sit up quickly, especially if you are an older patient. This reduces the risk of dizzy or  fainting spells. Alcohol can increase possible drowsiness and dizziness. Avoid alcoholic drinks. Your mouth may get dry. Chewing sugarless gum or sucking hard candy, and drinking plenty of water may help. Contact your doctor if the problem does not go away or is severe. Drinking plenty of water can also help prevent dehydration that can occur with diarrhea. Elderly patients may have a more variable response to the effects of this medicine, and are more susceptible to the effects of dehydration. What side effects may I notice from receiving this medicine? Side effects that you should report to your doctor or health care professional as soon as possible: - allergic reactions like skin rash, itching  or hives, swelling of the face, lips, or tongue -bloated, swollen feeling in your abdomen -blurred vision -loss of appetite -signs and symptoms of a dangerous change in heartbeat or heart rhythm like chest pain; dizziness; fast or irregular heartbeat palpitations; feeling faint or lightheaded, falls; breathing problems -stomach pain Side effects that usually do not require medical attention (report to your doctor or health care professional if they continue or are bothersome): - constipation -drowsiness or dizziness -dry mouth -nausea, vomiting This list may not describe all possible side effects. Call your doctor for medical advice about side effects. You may report side effects to FDA at 1-800-FDA-1088. Where should I keep my medicine? Keep out of the reach of children. Store at room temperature between 15 and 25 degrees C (59 and 77 degrees F). Keep container tightly closed. Throw away any unused medicine after the expiration date. NOTE: This sheet is a summary. It may not cover all possible information. If you have questions about this medicine, talk to your doctor, pharmacist, or health care provider.    2016, [employer]vier/[employer] Standard. (2015-03-25 15:29:29) Dexamethasone injection What is this  medicine? DEXAMETHASONE (dex a METH a sone) is a corticosteroid. It is used to treat inflammation of the skin, joints, lungs, and other organs. Common conditions treated include asthma, allergies, and arthritis. It is also used for other conditions, like blood disorders and diseases of the adrenal glands. This medicine may be used for other purposes; ask your health care provider or pharmacist if you have questions. What should I tell my health care provider before I take this medicine? They need to know if you have any of these conditions: -blood clotting problems -Cushing's syndrome -diabetes -glaucoma -heart problems or disease -high blood pressure -infection like herpes, measles, tuberculosis, or chickenpox -kidney disease -liver disease -mental problems -myasthenia gravis -osteoporosis -previous heart attack -seizures -stomach, ulcer or intestine disease including colitis and diverticulitis -thyroid problem -an unusual or allergic reaction to dexamethasone, corticosteroids, other medicines, lactose, foods, dyes, or preservatives -pregnant or trying to get pregnant -breast-feeding How should I use this medicine? This medicine is for injection into a muscle, joint, lesion, soft tissue, or vein. It is given by a health care professional in a hospital or clinic setting. Talk to your pediatrician regarding the use of this medicine in children. Special care may be needed. Overdosage: If you think you have taken too much of this medicine contact a poison control center or emergency room at once. NOTE: This medicine is only for you. Do not share this medicine with others. What if I miss a dose? This may not apply. If you are having a series of injections over a prolonged period, try not to miss an appointment. Call your doctor or health care professional to reschedule if you are unable to keep an appointment. What may interact with this medicine? Do not take this medicine with any of the  following medications: -mifepristone, RU-486 -vaccines This medicine may also interact with the following medications: -amphotericin B -antibiotics like clarithromycin, erythromycin, and troleandomycin -aspirin and aspirin-like drugs -barbiturates like phenobarbital -carbamazepine -cholestyramine -cholinesterase inhibitors like donepezil, galantamine, rivastigmine, and tacrine -cyclosporine -digoxin -diuretics -ephedrine -female hormones, like estrogens or progestins and birth control pills -indinavir -isoniazid -ketoconazole -medicines for diabetes -medicines that improve muscle tone or strength for conditions like myasthenia gravis -NSAIDs, medicines for pain and inflammation, like ibuprofen or naproxen -phenytoin -rifampin -thalidomide -warfarin This list may not describe all possible interactions. Give your health care provider a list of  all the medicines, herbs, non-prescription drugs, or dietary supplements you use. Also tell them if you smoke, drink alcohol, or use illegal drugs. Some items may interact with your medicine. What should I watch for while using this medicine? Your condition will be monitored carefully while you are receiving this medicine. If you are taking this medicine for a long time, carry an identification card with your name and address, the type and dose of your medicine, and your doctor's name and address. This medicine may increase your risk of getting an infection. Stay away from people who are sick. Tell your doctor or health care professional if you are around anyone with measles or chickenpox. Talk to your health care provider before you get any vaccines that you take this medicine. If you are going to have surgery, tell your doctor or health care professional that you have taken this medicine within the last twelve months. Ask your doctor or health care professional about your diet. You may need to lower the amount of salt you eat. The medicine can  increase your blood sugar. If you are a diabetic check with your doctor if you need help adjusting the dose of your diabetic medicine. What side effects may I notice from receiving this medicine? Side effects that you should report to your doctor or health care professional as soon as possible: -allergic reactions like skin rash, itching or hives, swelling of the face, lips, or tongue -black or tarry stools -change in the amount of urine -changes in vision -confusion, excitement, restlessness, a false sense of well-being -fever, sore throat, sneezing, cough, or other signs of infection, wounds that will not heal -hallucinations -increased thirst -mental depression, mood swings, mistaken feelings of self importance or of being mistreated -pain in hips, back, ribs, arms, shoulders, or legs -pain, redness, or irritation at the injection site -redness, blistering, peeling or loosening of the skin, including inside the mouth -rounding out of face -swelling of feet or lower legs -unusual bleeding or bruising -unusual tired or weak -wounds that do not heal Side effects that usually do not require medical attention (report to your doctor or health care professional if they continue or are bothersome): -diarrhea or constipation -change in taste -headache -nausea, vomiting -skin problems, acne, thin and shiny skin -touble sleeping -unusual growth of hair on the face or body -weight gain This list may not describe all possible side effects. Call your doctor for medical advice about side effects. You may report side effects to FDA at 1-800-FDA-1088. Where should I keep my medicine? This drug is given in a hospital or clinic and will not be stored at home. NOTE: This sheet is a summary. It may not cover all possible information. If you have questions about this medicine, talk to your doctor, pharmacist, or health care provider.    2016, Elsevier/Gold Standard. (2008-01-18 14:04:12)

## 2015-08-12 ENCOUNTER — Telehealth (HOSPITAL_COMMUNITY): Payer: Self-pay | Admitting: Hematology & Oncology

## 2015-08-12 NOTE — Telephone Encounter (Signed)
Pc to [place] to check status of pre auth request for gemzar. Per Donley Redder precert is not required because pt is out of scope.

## 2015-08-13 ENCOUNTER — Encounter (HOSPITAL_COMMUNITY): Payer: Medicare Other | Attending: Oncology

## 2015-08-13 VITALS — BP 132/59 | HR 76 | Temp 98.1°F | Resp 20 | Wt 113.0 lb

## 2015-08-13 DIAGNOSIS — C250 Malignant neoplasm of head of pancreas: Secondary | ICD-10-CM | POA: Diagnosis not present

## 2015-08-13 DIAGNOSIS — K838 Other specified diseases of biliary tract: Secondary | ICD-10-CM | POA: Insufficient documentation

## 2015-08-13 DIAGNOSIS — R634 Abnormal weight loss: Secondary | ICD-10-CM | POA: Insufficient documentation

## 2015-08-13 MED ORDER — ONDANSETRON HCL 8 MG PO TABS: 8.0000 mg | ORAL_TABLET | Freq: Three times a day (TID) | ORAL | Status: DC | PRN

## 2015-08-13 NOTE — Progress Notes (Signed)
Chemo teaching done and consent signed for Gemzar. Calendar given to pt. Pt aware that she may not be able to tolerate 3 weeks of treatment and may only be able to tolerate 2 weeks of tx per month. She said ok. Patient to pick up Zofran @ pharmacy. Edema to BLE. BLE are shiny in appearance. Distress screening done.

## 2015-08-15 ENCOUNTER — Encounter (HOSPITAL_BASED_OUTPATIENT_CLINIC_OR_DEPARTMENT_OTHER): Payer: Medicare Other | Admitting: Oncology

## 2015-08-15 ENCOUNTER — Encounter (HOSPITAL_BASED_OUTPATIENT_CLINIC_OR_DEPARTMENT_OTHER): Payer: Medicare Other

## 2015-08-15 ENCOUNTER — Encounter: Payer: Self-pay | Admitting: Dietician

## 2015-08-15 ENCOUNTER — Ambulatory Visit (HOSPITAL_COMMUNITY): Payer: Medicare Other | Admitting: Oncology

## 2015-08-15 VITALS — BP 72/48 | HR 115 | Temp 98.1°F | Resp 18 | Wt 114.0 lb

## 2015-08-15 VITALS — BP 146/78 | HR 92 | Temp 97.8°F | Resp 16

## 2015-08-15 DIAGNOSIS — C250 Malignant neoplasm of head of pancreas: Secondary | ICD-10-CM

## 2015-08-15 DIAGNOSIS — K838 Other specified diseases of biliary tract: Secondary | ICD-10-CM | POA: Diagnosis present

## 2015-08-15 DIAGNOSIS — R634 Abnormal weight loss: Secondary | ICD-10-CM | POA: Diagnosis present

## 2015-08-15 DIAGNOSIS — R609 Edema, unspecified: Secondary | ICD-10-CM | POA: Diagnosis not present

## 2015-08-15 DIAGNOSIS — C259 Malignant neoplasm of pancreas, unspecified: Secondary | ICD-10-CM

## 2015-08-15 DIAGNOSIS — Z5111 Encounter for antineoplastic chemotherapy: Secondary | ICD-10-CM | POA: Diagnosis not present

## 2015-08-15 LAB — CBC WITH DIFFERENTIAL/PLATELET
BASOS PCT: 0 %
Basophils Absolute: 0 10*3/uL (ref 0.0–0.1)
Eosinophils Absolute: 0.1 10*3/uL (ref 0.0–0.7)
Eosinophils Relative: 1 %
HEMATOCRIT: 30.8 % — AB (ref 36.0–46.0)
HEMOGLOBIN: 10.6 g/dL — AB (ref 12.0–15.0)
Lymphocytes Relative: 37 %
Lymphs Abs: 3.1 10*3/uL (ref 0.7–4.0)
MCH: 28 pg (ref 26.0–34.0)
MCHC: 34.4 g/dL (ref 30.0–36.0)
MCV: 81.5 fL (ref 78.0–100.0)
MONOS PCT: 6 %
Monocytes Absolute: 0.5 10*3/uL (ref 0.1–1.0)
NEUTROS ABS: 4.7 10*3/uL (ref 1.7–7.7)
Neutrophils Relative %: 56 %
Platelets: 262 10*3/uL (ref 150–400)
RBC: 3.78 MIL/uL — ABNORMAL LOW (ref 3.87–5.11)
RDW: 17 % — ABNORMAL HIGH (ref 11.5–15.5)
WBC: 8.4 10*3/uL (ref 4.0–10.5)

## 2015-08-15 LAB — COMPREHENSIVE METABOLIC PANEL
ALK PHOS: 80 U/L (ref 38–126)
ALT: 32 U/L (ref 14–54)
AST: 46 U/L — AB (ref 15–41)
Albumin: 2.4 g/dL — ABNORMAL LOW (ref 3.5–5.0)
Anion gap: 8 (ref 5–15)
BILIRUBIN TOTAL: 1 mg/dL (ref 0.3–1.2)
BUN: 11 mg/dL (ref 6–20)
CALCIUM: 8.3 mg/dL — AB (ref 8.9–10.3)
CO2: 22 mmol/L (ref 22–32)
CREATININE: 1.05 mg/dL — AB (ref 0.44–1.00)
Chloride: 109 mmol/L (ref 101–111)
GFR calc Af Amer: 57 mL/min — ABNORMAL LOW (ref >60)
GFR calc non Af Amer: 49 mL/min — ABNORMAL LOW (ref >60)
GLUCOSE: 119 mg/dL — AB (ref 65–99)
Potassium: 3.5 mmol/L (ref 3.5–5.1)
Sodium: 139 mmol/L (ref 135–145)
TOTAL PROTEIN: 6.1 g/dL — AB (ref 6.5–8.1)

## 2015-08-15 MED ORDER — SODIUM CHLORIDE 0.9 % IV SOLN
Freq: Once | INTRAVENOUS | Status: AC
  Administered 2015-08-15: 11:00:00 via INTRAVENOUS
  Filled 2015-08-15: qty 4

## 2015-08-15 MED ORDER — SODIUM CHLORIDE 0.9 % IV SOLN
1000.0000 mg/m2 | Freq: Once | INTRAVENOUS | Status: AC
  Administered 2015-08-15: 1482 mg via INTRAVENOUS
  Filled 2015-08-15: qty 38.98

## 2015-08-15 MED ORDER — SODIUM CHLORIDE 0.9 % IV SOLN
Freq: Once | INTRAVENOUS | Status: AC
  Administered 2015-08-15: 11:00:00 via INTRAVENOUS

## 2015-08-15 MED ORDER — HEPARIN SOD (PORK) LOCK FLUSH 100 UNIT/ML IV SOLN
500.0000 [IU] | Freq: Once | INTRAVENOUS | Status: DC | PRN
Start: 2015-08-15 — End: 2015-08-15

## 2015-08-15 MED ORDER — SODIUM CHLORIDE 0.9 % IJ SOLN: 10.0000 mL | INTRAMUSCULAR | Status: DC | PRN

## 2015-08-15 NOTE — Patient Instructions (Signed)
[facility] Discharge Instructions for Patients Receiving Chemotherapy  Today you received the following chemotherapy agents gemzar Fluids today, blood pressure is low, be careful when you stand Return as scheduled Please call the clinic if you have any questions or concerns  To help prevent nausea and vomiting after your treatment, we encourage you to take your nausea medication    If you develop nausea and vomiting, or diarrhea that is not controlled by your medication, call the clinic.  The clinic phone number is [phone]. Office hours are Monday-Friday 8:30am-5:00pm.  BELOW ARE SYMPTOMS THAT SHOULD BE REPORTED IMMEDIATELY:  *FEVER GREATER THAN 101.0 F  *CHILLS WITH OR WITHOUT FEVER  NAUSEA AND VOMITING THAT IS NOT CONTROLLED WITH YOUR NAUSEA MEDICATION  *UNUSUAL SHORTNESS OF BREATH  *UNUSUAL BRUISING OR BLEEDING  TENDERNESS IN MOUTH AND THROAT WITH OR WITHOUT PRESENCE OF ULCERS  *URINARY PROBLEMS  *BOWEL PROBLEMS  UNUSUAL RASH Items with * indicate a potential emergency and should be followed up as soon as possible. If you have an emergency after office hours please contact your primary care physician or go to the nearest emergency department.  Please call the clinic during office hours if you have any questions or concerns.   You may also contact the Patient Navigator at [phone] should you have any questions or need assistance in obtaining follow up care. _____________________________________________________________________ Have you asked about our STAR program?    STAR stands for Survivorship Training and Rehabilitation, and this is a nationally recognized cancer care program that focuses on survivorship and rehabilitation.  Cancer and cancer treatments may cause problems, such as, pain, making you feel tired and keeping you from doing the things that you need or want to do. Cancer rehabilitation can help. Our goal is to reduce these troubling  effects and help you have the best quality of life possible.  You may receive a survey from a nurse that asks questions about your current state of health.  Based on the survey results, all eligible patients will be referred to the [facility] program for an evaluation so we can better serve you! A frequently asked questions sheet is available upon request.

## 2015-08-15 NOTE — Assessment & Plan Note (Signed)
Adenocarcinoma of the pancreas (T3N0M0) with positive ucinate margin, S/P whipple procedure by Dr. Eugenia Pancoast at [facility] on 06/05/2015.  Oncology history updated.  She is beginning systemic chemotherapy in the adjuvant setting with single-agent gemcitabine today.  Her labs today meet paramters for treatment.  Labs on days 1, 8, 15: CBC diff, CMET  She notes that she called her pharmacy about her Lasix Rx and they report they have not received the Rx.  In [facility], it is confirmed that they received the e-scribed medication the day it was prescribed.  Our nurse is checking into this.  Her BP is below her baseline.  We will give 500 cc of NS today at 250cc/hr.  She will hold her Losartan for 1 week at which time, we will recheck BP in the clinic.  She will continue with her Toprol XL as directed.  Hydration education is provided.  She does not have a port.  We will try peripheral treatment first to see how well she tolerates treatment.  Return in 1 week for Day of 8 of treatment, pre-chemo labs, and follow-up appointment for tolerance check.

## 2015-08-15 NOTE — Progress Notes (Signed)
Follow up with patient. She is due for first chemo session today.   Wt Readings from Last 10 Encounters:  08/15/15 114 lb (51.71 kg)  08/13/15 113 lb (51.256 kg)  08/06/15 109 lb 9.6 oz (49.714 kg)  07/25/15 113 lb 11.2 oz (51.574 kg)  07/18/15 113 lb 3.2 oz (51.347 kg)  07/04/15 112 lb 12.8 oz (51.166 kg)  06/27/15 112 lb 14.4 oz (51.211 kg)  05/13/15 120 lb (54.432 kg)  04/25/15 120 lb 3.2 oz (54.522 kg)  04/22/15 119 lb 12.8 oz (54.341 kg)   Fortunately, Patients weight has  stabilized.   Patient reports oral intake as fair, but markedly improved. She states she is eating 2 meals and 2 snacks daily. She also drinks 1 Ensure.   She reports that she is eating high protein sources such as chicken, liver, ensure. She declined receiving a case of Ensure at this time.   She has had no n/v/c. She is still having diarrhea, but it has decreased in frequency. She says she has 3 bouts in the morning and a couple at night.   Pt seems to be much improved and was in a good mood today. She was eating a bagged lunch.   Will f/u with pt next week to see if she how she tolerated her first session.   Burtis Junes RD, LDN Nutrition Pager: [phone] 08/15/2015 12:04 PM

## 2015-08-15 NOTE — Progress Notes (Signed)
OK to treat per [name] PA, creatinine has improved since last labs, NS running at 268mL/hr due to low blood pressure, 500 mL total given   B/P 146/78 at discharge Dr Whitney Muse notified, pt to take blood pressure medication every other day, pt verbalized understanding.  Veronica Stevenson Tolerated chemotherapy well today Discharged ambulatory

## 2015-08-15 NOTE — Progress Notes (Signed)
Veronica Rival, NP [address] [place] [address]  Malignant neoplasm of head of pancreas ([place]) - Plan: CBC with Differential, Comprehensive metabolic panel  CURRENT THERAPY: Gemcitabine single-agent days 1, 8, 15 every 28 days, beginning on 08/15/2015.  INTERVAL HISTORY: Veronica Stevenson 81 y.o. female returns for followup of Adenocarcinoma of the pancreas (T3N0M0) with positive ucinate margin, S/P whipple procedure by Dr. Eugenia Stevenson at [facility] on 06/05/2015.    Pancreatic cancer ([place])   04/17/2015 Tumor Marker Results for Veronica Stevenson, Veronica Stevenson (MRN [id number]) as of 07/25/2015 09:24  04/17/2015 08:57 CA 19-9: 701 (H)    04/18/2015 Imaging CT abd/pelvis- Severe intrahepatic and extrahepatic biliary ductal and pancreatic ductal dilatation is noted secondary to 3 cm pancreatic head mass consistent with malignancy.   04/23/2015 Pathology Results BILE DUCT BRUSHING (SPECIMEN 1 OF 1 COLLECTED 04/23/2015) NONDIAGNOSTIC MATERIAL.   04/23/2015 Procedure ERCP- Dr. Ardis Hughs   06/05/2015 Surgery Dr. Eugenia Stevenson at [facility] procedure demonstrating a T3N0 pancreatic mass with positive ucinate margin   07/18/2015 Tumor Marker Results for Veronica Stevenson (MRN [id number]) as of 07/25/2015 09:24  07/18/2015 08:52 CA 19-9: 49 (H)    08/15/2015 -  Chemotherapy Gemcitabine single agent days 1, 8, 15 every 28 days.   I personally reviewed and went over laboratory results with the patient.  The results are noted within this dictation.  Vitals today show that her BP is below baseline.  She is on Toprol XL and Losartan.  She has not taken her losartan today.  She notes that her leg are still edematous.  She has not gotten her Lasix Rx.  Her legs are tender as a result.   Past Medical History  Diagnosis Date  . Essential hypertension   . Hyperlipidemia   . Asthmatic bronchitis   . Chronic pancreatitis ([place])   . Osteoarthritis of knee   . Atypical ductal hyperplasia of right breast   . Vitamin D  deficiency   . GERD (gastroesophageal reflux disease)   . Hyperparathyroidism ([place])   . Zollinger-Ellison syndrome   . Carotid artery disease ([place])     has Hypertension; Hypercholesteremia; Acute pancreatitis; HLD (hyperlipidemia); Essential hypertension; Hyperglycemia; Anemia; Dyspepsia; Elevated lipase; Abnormal magnetic resonance cholangiopancreatography (MRCP); Loss of weight; Abdominal pain; Obstructive jaundice; and Pancreatic cancer ([place]) on her problem list.     is allergic to codeine; omeprazole; and penicillins.  Current Outpatient Prescriptions on File Prior to Visit  Medication Sig Dispense Refill  . acetaminophen (TYLENOL) 500 MG tablet Take 500 mg by mouth every 6 (six) hours as needed for mild pain or moderate pain.    Marland Kitchen aspirin EC 81 MG tablet Take 81 mg by mouth every morning.    Marland Kitchen atorvastatin (LIPITOR) 10 MG tablet Take 10 mg by mouth daily after lunch.     . furosemide (LASIX) 20 MG tablet Take 1 tablet (20 mg total) by mouth daily as needed. 15 tablet 1  . Gemcitabine HCl (GEMZAR IV) Inject into the vein. To start August 15, 2015.    Marland Kitchen losartan (COZAAR) 100 MG tablet Take 100 mg by mouth daily.     . metoCLOPramide (REGLAN) 10 MG tablet Take 10 mg by mouth 4 (four) times daily. And at night  0  . metoprolol succinate (TOPROL-XL) 25 MG 24 hr tablet Take 25 mg by mouth every evening.     . ondansetron (ZOFRAN) 8 MG tablet Take 1 tablet (8 mg total) by mouth every 8 (eight)  hours as needed for nausea or vomiting. 30 tablet 2  . oxyCODONE (OXY IR/ROXICODONE) 5 MG immediate release tablet Take 1 tablet (5 mg total) by mouth every 6 (six) hours as needed. 45 tablet 0  . polyethylene glycol (MIRALAX / GLYCOLAX) packet Take 17 g by mouth daily.    . potassium chloride 20 MEQ/15ML (10%) SOLN Take 40 mEq by mouth daily.     . Cholecalciferol (VITAMIN D) 2000 UNITS CAPS Take 1 capsule by mouth every morning.     . loperamide (IMODIUM A-D) 2 MG tablet Take 2 mg by mouth as needed  for diarrhea or loose stools.     Current Facility-Administered Medications on File Prior to Visit  Medication Dose Route Frequency Provider Last Rate Last Dose  . gemcitabine (GEMZAR) 1,482 mg in sodium chloride 0.9 % 100 mL chemo infusion  1,000 mg/m2 (Treatment Plan Actual) Intravenous Once Patrici Ranks, MD      . heparin lock flush 100 unit/mL  500 Units Intracatheter Once PRN Patrici Ranks, MD      . sodium chloride 0.9 % injection 10 mL  10 mL Intracatheter PRN Patrici Ranks, MD        Past Surgical History  Procedure Laterality Date  . Colonoscopy  2009    [place]  . Tonsillectomy    . Cataract extraction, bilateral Bilateral   . Parathyroidectomy    . Ovarian cyst removal    . Eus N/A 04/17/2015    Jacobs-1.7 cm x 2.5 cm mass in head of pancreas causing dilation of pancreatic duct and common bile duct.  . Ercp N/A 04/23/2015    BPZ:WCHENIDPOE, heterogeneous,indistinctly bordered mass in head of pancreas  . Sphincterotomy N/A 04/23/2015    Procedure: SPHINCTEROTOMY;  Surgeon: Daneil Dolin, MD;  Location: [facility];  Service: Endoscopy;  Laterality: N/A;  . Biliary stent placement N/A 04/23/2015    Procedure: BILIARY STENT PLACEMENT;  Surgeon: Daneil Dolin, MD;  Location: [facility];  Service: Endoscopy;  Laterality: N/A;  . Whipple procedure  August 15th 2016    Denies any headaches, dizziness, double vision, fevers, chills, night sweats, nausea, vomiting, constipation, chest pain, heart palpitations, shortness of breath, blood in stool, black tarry stool, urinary pain, urinary burning, urinary frequency, hematuria.   PHYSICAL EXAMINATION  ECOG PERFORMANCE STATUS: 1 - Symptomatic but completely ambulatory  Filed Vitals:   08/15/15 0954  BP: 72/48  Pulse: 115  Temp: 98.1 F (36.7 C)  Resp: 18    GENERAL:alert, no distress, comfortable, cooperative, smiling and in chemo-recliner, unaccompanied. SKIN: skin color, texture, turgor are normal, no rashes or  significant lesions HEAD: Normocephalic, No masses, lesions, tenderness or abnormalities EYES: normal, PERRLA, EOMI, Conjunctiva are pink and non-injected EARS: External ears normal OROPHARYNX:lips, buccal mucosa, and tongue normal and mucous membranes are moist  NECK: supple, no adenopathy, trachea midline LYMPH:  no palpable lymphadenopathy BREAST:not examined LUNGS: clear to auscultation  HEART: regular rate & rhythm ABDOMEN:abdomen soft, normal bowel sounds and minimally tender as surgical site on palpation. BACK: Back symmetric, no curvature. EXTREMITIES:less then 2 second capillary refill, no joint deformities, effusion, or inflammation, no skin discoloration, no cyanosis , positive: B/L LE edema 2-3+ pitting pre-tibially. NEURO: alert & oriented x 3 with fluent speech, no focal motor/sensory deficits, gait normal    LABORATORY DATA: CBC    Component Value Date/Time   WBC 8.4 08/15/2015 1014   WBC 7.9 10/31/2014 1347   RBC 3.78* 08/15/2015 1014   RBC  4.17 10/31/2014 1347   HGB 10.6* 08/15/2015 1014   HGB 12.2 10/31/2014 1347   HCT 30.8* 08/15/2015 1014   HCT 38.4 10/31/2014 1347   PLT 262 08/15/2015 1014   PLT 218 10/31/2014 1347   MCV 81.5 08/15/2015 1014   MCV 92 10/31/2014 1347   MCH 28.0 08/15/2015 1014   MCH 29.2 10/31/2014 1347   MCHC 34.4 08/15/2015 1014   MCHC 31.8* 10/31/2014 1347   RDW 17.0* 08/15/2015 1014   RDW 13.8 10/31/2014 1347   LYMPHSABS 3.1 08/15/2015 1014   LYMPHSABS 2.4 10/31/2014 1347   MONOABS 0.5 08/15/2015 1014   MONOABS 0.6 10/31/2014 1347   EOSABS 0.1 08/15/2015 1014   EOSABS 0.1 10/31/2014 1347   BASOSABS 0.0 08/15/2015 1014   BASOSABS 0.0 10/31/2014 1347      Chemistry      Component Value Date/Time   NA 139 08/15/2015 1014   NA 142 10/31/2014 1347   K 3.5 08/15/2015 1014   K 3.5 10/31/2014 1347   CL 109 08/15/2015 1014   CL 108* 10/31/2014 1347   CO2 22 08/15/2015 1014   CO2 26 10/31/2014 1347   BUN 11 08/15/2015 1014    BUN 19* 10/31/2014 1347   CREATININE 1.05* 08/15/2015 1014   CREATININE 0.95 01/23/2015 1452   CREATININE 1.10 10/31/2014 1347      Component Value Date/Time   CALCIUM 8.3* 08/15/2015 1014   CALCIUM 9.3 10/31/2014 1347   ALKPHOS 80 08/15/2015 1014   ALKPHOS 88 10/31/2014 1347   AST 46* 08/15/2015 1014   AST 35 10/31/2014 1347   ALT 32 08/15/2015 1014   ALT 26 10/31/2014 1347   BILITOT 1.0 08/15/2015 1014   BILITOT 0.4 10/31/2014 1347        PENDING LABS:   RADIOGRAPHIC STUDIES:  No results found.   PATHOLOGY:    ASSESSMENT AND PLAN:  Pancreatic cancer ([place]) Adenocarcinoma of the pancreas (T3N0M0) with positive ucinate margin, S/P whipple procedure by Dr. Eugenia Stevenson at [facility] on 06/05/2015.  Oncology history updated.  She is beginning systemic chemotherapy in the adjuvant setting with single-agent gemcitabine today.  Her labs today meet paramters for treatment.  Labs on days 1, 8, 15: CBC diff, CMET  She notes that she called her pharmacy about her Lasix Rx and they report they have not received the Rx.  In [facility], it is confirmed that they received the e-scribed medication the day it was prescribed.  Our nurse is checking into this.  Her BP is below her baseline.  We will give 500 cc of NS today at 250cc/hr.  She will hold her Losartan for 1 week at which time, we will recheck BP in the clinic.  She will continue with her Toprol XL as directed.  Hydration education is provided.  She does not have a port.  We will try peripheral treatment first to see how well she tolerates treatment.  Return in 1 week for Day of 8 of treatment, pre-chemo labs, and follow-up appointment for tolerance check.    THERAPY PLAN:  Continue treatment as planned.  Depending on tolerance and bone marrow toxicity, we may need to cancel day 15.  All questions were answered. The patient knows to call the clinic with any problems, questions or concerns. We can certainly see the patient much sooner  if necessary.  Patient and plan discussed with Dr. Ancil Linsey and she is in agreement with the aforementioned.   This note is electronically signed by: Doy Mince 08/15/2015  11:57 AM

## 2015-08-15 NOTE — Patient Instructions (Signed)
[place] at [facility] Discharge Instructions  RECOMMENDATIONS MADE BY THE CONSULTANT AND ANY TEST RESULTS WILL BE SENT TO YOUR REFERRING PHYSICIAN.  You will have labs in one week as well as follow up appointment with the doctor.   Please do not take your Losartan for one week because of your low blood pressure.   You will also see Dr. Chanteria Haggard Muse on 09/12/2015.  Please see appointment list for dates and times for appointments.    Thank you for choosing [place] at [facility] to provide your oncology and hematology care.  To afford each patient quality time with our provider, please arrive at least 15 minutes before your scheduled appointment time.    You need to re-schedule your appointment should you arrive 10 or more minutes late.  We strive to give you quality time with our providers, and arriving late affects you and other patients whose appointments are after yours.  Also, if you no show three or more times for appointments you may be dismissed from the clinic at the providers discretion.     Again, thank you for choosing [facility].  Our hope is that these requests will decrease the amount of time that you wait before being seen by our physicians.       _____________________________________________________________  Should you have questions after your visit to [facility], please contact our office at [phone] between the hours of 8:30 a.m. and 4:30 p.m.  Voicemails left after 4:30 p.m. will not be returned until the following business day.  For prescription refill requests, have your pharmacy contact our office.

## 2015-08-19 ENCOUNTER — Encounter: Payer: Self-pay | Admitting: *Deleted

## 2015-08-19 NOTE — Progress Notes (Signed)
[facility] Psychosocial Distress Screening Clinical Social Work  Clinical Social Work was referred by distress screening protocol.  The patient scored a 5 on the Psychosocial Distress Thermometer which indicates moderate distress. Clinical Social Worker phoned pt at home to assess for distress and other psychosocial needs. Pt reports she has lots of support from her sister and stayed with her recently at her home in [place]. Pt has concerns about her finances and is concerned about covering her medical expenses. Pt appears she may meet income criteria for medicaid that would assist with her medicare copay. CSW directed her to go to her local DSS to further apply for assistance. Pt denies concerns with transportation, as her sister will bring her. This could become an issue, however, as her sister has to drive from [place] to take her to appointments. She may need further assistance in the future and CSW educated her on how to get assistance. CSW available and pt aware of how to contact.   ONCBCN DISTRESS SCREENING 08/13/2015  Screening Type Initial Screening  Distress experienced in past week (1-10) 5  Practical problem type Insurance  Emotional problem type Adjusting to illness  Physical Problem type Pain;Sleep/insomnia;Loss of appetitie;Constipation/diarrhea;Tingling hands/feet;Swollen arms/legs  Physician notified of physical symptoms (No Data)  Referral to clinical social work Yes  Referral to dietition Yes  Referral to financial advocate Yes    Clinical Social Worker follow up needed: No.  If yes, follow up plan:  Loren Racer, [place] Tuesdays   [phone]

## 2015-08-22 ENCOUNTER — Encounter (HOSPITAL_BASED_OUTPATIENT_CLINIC_OR_DEPARTMENT_OTHER): Payer: Medicare Other

## 2015-08-22 ENCOUNTER — Encounter (HOSPITAL_BASED_OUTPATIENT_CLINIC_OR_DEPARTMENT_OTHER): Payer: Medicare Other | Admitting: Oncology

## 2015-08-22 VITALS — BP 130/50 | HR 88 | Temp 97.8°F | Resp 16 | Wt 114.7 lb

## 2015-08-22 DIAGNOSIS — C250 Malignant neoplasm of head of pancreas: Secondary | ICD-10-CM | POA: Diagnosis not present

## 2015-08-22 DIAGNOSIS — R609 Edema, unspecified: Secondary | ICD-10-CM | POA: Diagnosis not present

## 2015-08-22 DIAGNOSIS — D649 Anemia, unspecified: Secondary | ICD-10-CM | POA: Diagnosis not present

## 2015-08-22 DIAGNOSIS — Z5111 Encounter for antineoplastic chemotherapy: Secondary | ICD-10-CM

## 2015-08-22 DIAGNOSIS — C259 Malignant neoplasm of pancreas, unspecified: Secondary | ICD-10-CM

## 2015-08-22 LAB — COMPREHENSIVE METABOLIC PANEL
ALBUMIN: 2.4 g/dL — AB (ref 3.5–5.0)
ALK PHOS: 83 U/L (ref 38–126)
ALT: 23 U/L (ref 14–54)
ANION GAP: 6 (ref 5–15)
AST: 26 U/L (ref 15–41)
BUN: 15 mg/dL (ref 6–20)
CALCIUM: 8.2 mg/dL — AB (ref 8.9–10.3)
CO2: 23 mmol/L (ref 22–32)
Chloride: 110 mmol/L (ref 101–111)
Creatinine, Ser: 1.28 mg/dL — ABNORMAL HIGH (ref 0.44–1.00)
GFR calc Af Amer: 45 mL/min — ABNORMAL LOW (ref >60)
GFR calc non Af Amer: 38 mL/min — ABNORMAL LOW (ref >60)
GLUCOSE: 96 mg/dL (ref 65–99)
POTASSIUM: 3.4 mmol/L — AB (ref 3.5–5.1)
SODIUM: 139 mmol/L (ref 135–145)
Total Bilirubin: 0.9 mg/dL (ref 0.3–1.2)
Total Protein: 6.1 g/dL — ABNORMAL LOW (ref 6.5–8.1)

## 2015-08-22 LAB — CBC WITH DIFFERENTIAL/PLATELET
BASOS PCT: 0 %
Basophils Absolute: 0 10*3/uL (ref 0.0–0.1)
Eosinophils Absolute: 0 10*3/uL (ref 0.0–0.7)
Eosinophils Relative: 0 %
HCT: 23.2 % — ABNORMAL LOW (ref 36.0–46.0)
HEMOGLOBIN: 8.3 g/dL — AB (ref 12.0–15.0)
Lymphocytes Relative: 48 %
Lymphs Abs: 1.5 10*3/uL (ref 0.7–4.0)
MCH: 28.8 pg (ref 26.0–34.0)
MCHC: 35.8 g/dL (ref 30.0–36.0)
MCV: 80.6 fL (ref 78.0–100.0)
Monocytes Absolute: 0.1 10*3/uL (ref 0.1–1.0)
Monocytes Relative: 3 %
NEUTROS ABS: 1.6 10*3/uL — AB (ref 1.7–7.7)
NEUTROS PCT: 49 %
PLATELETS: 156 10*3/uL (ref 150–400)
RBC: 2.88 MIL/uL — ABNORMAL LOW (ref 3.87–5.11)
RDW: 17.4 % — AB (ref 11.5–15.5)
WBC: 3.2 10*3/uL — ABNORMAL LOW (ref 4.0–10.5)

## 2015-08-22 MED ORDER — OXYCODONE HCL 5 MG PO TABS: 5.0000 mg | ORAL_TABLET | Freq: Four times a day (QID) | ORAL | Status: DC | PRN

## 2015-08-22 MED ORDER — SODIUM CHLORIDE 0.9 % IV SOLN
1000.0000 mg/m2 | Freq: Once | INTRAVENOUS | Status: AC
  Administered 2015-08-22: 1482 mg via INTRAVENOUS
  Filled 2015-08-22: qty 38.98

## 2015-08-22 MED ORDER — SODIUM CHLORIDE 0.9 % IV SOLN
Freq: Once | INTRAVENOUS | Status: AC
  Administered 2015-08-22: 13:00:00 via INTRAVENOUS

## 2015-08-22 MED ORDER — HEPARIN SOD (PORK) LOCK FLUSH 100 UNIT/ML IV SOLN: 500.0000 [IU] | Freq: Once | INTRAVENOUS | Status: DC | PRN

## 2015-08-22 MED ORDER — SODIUM CHLORIDE 0.9 % IV SOLN
Freq: Once | INTRAVENOUS | Status: AC
  Administered 2015-08-22: 13:00:00 via INTRAVENOUS
  Filled 2015-08-22: qty 4

## 2015-08-22 MED ORDER — SODIUM CHLORIDE 0.9 % IJ SOLN
10.0000 mL | INTRAMUSCULAR | Status: DC | PRN
  Administered 2015-08-22: 10 mL
  Filled 2015-08-22: qty 10

## 2015-08-22 NOTE — Patient Instructions (Signed)
[facility] Discharge Instructions for Patients Receiving Chemotherapy  Today you received the following chemotherapy agents Gemzar as scheduled. You were given a Irisa Grimsley prescription for oxycodone. Blood transfusion on Monday. Return as scheduled.  To help prevent nausea and vomiting after your treatment, we encourage you to take your nausea medication as instructed. If you develop nausea and vomiting that is not controlled by your nausea medication, call the clinic. If it is after clinic hours your family physician or the after hours number for the clinic or go to the Emergency Department. BELOW ARE SYMPTOMS THAT SHOULD BE REPORTED IMMEDIATELY:  *FEVER GREATER THAN 101.0 F  *CHILLS WITH OR WITHOUT FEVER  NAUSEA AND VOMITING THAT IS NOT CONTROLLED WITH YOUR NAUSEA MEDICATION  *UNUSUAL SHORTNESS OF BREATH  *UNUSUAL BRUISING OR BLEEDING  TENDERNESS IN MOUTH AND THROAT WITH OR WITHOUT PRESENCE OF ULCERS  *URINARY PROBLEMS  *BOWEL PROBLEMS  UNUSUAL RASH Items with * indicate a potential emergency and should be followed up as soon as possible.  I have been informed and understand all the instructions given to me. I know to contact the clinic, my physician, or go to the Emergency Department if any problems should occur. I do not have any questions at this time, but understand that I may call the clinic during office hours or the Patient Navigator at [phone] should I have any questions or need assistance in obtaining follow up care.    __________________________________________  _____________  __________ Signature of Patient or Authorized Representative            Date                   Time    __________________________________________ Nurse's Signature

## 2015-08-22 NOTE — Progress Notes (Addendum)
[name], NP [address] [place]  [address]  Malignant neoplasm of head of pancreas ([place]) - Plan: oxyCODONE (OXY IR/ROXICODONE) 5 MG immediate release tablet, Practitioner attestation of consent, Complete patient signature process for consent form, Care order/instruction, 0.9 %  sodium chloride infusion, sodium chloride 0.9 % injection 10 mL, heparin lock flush 100 unit/mL, heparin lock flush 100 unit/mL, sodium chloride 0.9 % injection 3 mL, Type and screen, diphenhydrAMINE (BENADRYL) capsule 25 mg, acetaminophen (TYLENOL) tablet 650 mg, Prepare RBC, Transfuse RBC  CURRENT THERAPY: Gemcitabine single-agent days 1, 8, 15 every 28 days, beginning on 08/15/2015.  INTERVAL HISTORY: Veronica Stevenson 81 y.o. female returns for followup of Adenocarcinoma of the pancreas (T3N0M0) with positive ucinate margin, S/P whipple procedure by Dr. Eugenia Pancoast at [facility] on 06/05/2015.    Pancreatic cancer (Veronica Stevenson)   04/17/2015 Tumor Marker Results for Veronica, Stevenson (MRN [id number]) as of 07/25/2015 09:24  04/17/2015 08:57 CA 19-9: 701 (H)    04/18/2015 Imaging CT abd/pelvis- Severe intrahepatic and extrahepatic biliary ductal and pancreatic ductal dilatation is noted secondary to 3 cm pancreatic head mass consistent with malignancy.   04/23/2015 Pathology Results BILE DUCT BRUSHING (SPECIMEN 1 OF 1 COLLECTED 04/23/2015) NONDIAGNOSTIC MATERIAL.   04/23/2015 Procedure ERCP- Dr. Ardis Hughs   06/05/2015 Surgery Dr. Eugenia Pancoast at [place] procedure demonstrating a T3N0 pancreatic mass with positive ucinate margin   07/18/2015 Tumor Marker Results for Veronica, Stevenson (MRN [id number]) as of 07/25/2015 09:24  07/18/2015 08:52 CA 19-9: 49 (H)    08/15/2015 -  Chemotherapy Gemcitabine single agent days 1, 8, 15 every 28 days.   I personally reviewed and went over laboratory results with the patient.  The results are noted within this dictation.  WBC, ANC, and HGB are noted.  She is not a candidate for ESA  therapy given curative intent of treatment.  She tolerated her first cycle of chemotherapy well.  She notes 1 day of post-chemo nausea that was well controlled and aborted with home antiemetics.    She continues with B/L LE edema.  She notes that she was taking Lasix QOD due to the increased urination.  She did not want to have that every day.    Otherwise, she denies any new complaints.  Past Medical History  Diagnosis Date  . Essential hypertension   . Hyperlipidemia   . Asthmatic bronchitis   . Chronic pancreatitis ([place])   . Osteoarthritis of knee   . Atypical ductal hyperplasia of right breast   . Vitamin D deficiency   . GERD (gastroesophageal reflux disease)   . Hyperparathyroidism ([place])   . Zollinger-Ellison syndrome   . Carotid artery disease ([place])     has Hypertension; Hypercholesteremia; Acute pancreatitis; HLD (hyperlipidemia); Essential hypertension; Hyperglycemia; Anemia; Dyspepsia; Elevated lipase; Abnormal magnetic resonance cholangiopancreatography (MRCP); Loss of weight; Abdominal pain; Obstructive jaundice; and Pancreatic cancer (Veronica Stevenson) on her problem list.     is allergic to codeine; omeprazole; and penicillins.  Current Outpatient Prescriptions on File Prior to Visit  Medication Sig Dispense Refill  . acetaminophen (TYLENOL) 500 MG tablet Take 500 mg by mouth every 6 (six) hours as needed for mild pain or moderate pain.    Marland Kitchen aspirin EC 81 MG tablet Take 81 mg by mouth every morning.    Marland Kitchen atorvastatin (LIPITOR) 10 MG tablet Take 10 mg by mouth daily after lunch.     . Cholecalciferol (VITAMIN D) 2000 UNITS CAPS Take 1 capsule by mouth every  morning.     . furosemide (LASIX) 20 MG tablet Take 1 tablet (20 mg total) by mouth daily as needed. 15 tablet 1  . Gemcitabine HCl (GEMZAR IV) Inject into the vein. To start August 15, 2015.    . loperamide (IMODIUM A-D) 2 MG tablet Take 2 mg by mouth as needed for diarrhea or loose stools.    Marland Kitchen losartan (COZAAR) 100 MG  tablet Take 100 mg by mouth daily.     . metoCLOPramide (REGLAN) 10 MG tablet Take 10 mg by mouth 4 (four) times daily. And at night  0  . metoprolol succinate (TOPROL-XL) 25 MG 24 hr tablet Take 25 mg by mouth every evening.     . ondansetron (ZOFRAN) 8 MG tablet Take 1 tablet (8 mg total) by mouth every 8 (eight) hours as needed for nausea or vomiting. 30 tablet 2  . polyethylene glycol (MIRALAX / GLYCOLAX) packet Take 17 g by mouth daily.    . potassium chloride 20 MEQ/15ML (10%) SOLN Take 40 mEq by mouth daily.      No current facility-administered medications on file prior to visit.    Past Surgical History  Procedure Laterality Date  . Colonoscopy  2009    Veronica Stevenson  . Tonsillectomy    . Cataract extraction, bilateral Bilateral   . Parathyroidectomy    . Ovarian cyst removal    . Eus N/A 04/17/2015    Jacobs-1.7 cm x 2.5 cm mass in head of pancreas causing dilation of pancreatic duct and common bile duct.  . Ercp N/A 04/23/2015    [id number], heterogeneous,indistinctly bordered mass in head of pancreas  . Sphincterotomy N/A 04/23/2015    Procedure: SPHINCTEROTOMY;  Surgeon: Daneil Dolin, MD;  Location: [facility];  Service: Endoscopy;  Laterality: N/A;  . Biliary stent placement N/A 04/23/2015    Procedure: BILIARY STENT PLACEMENT;  Surgeon: Daneil Dolin, MD;  Location: [facility];  Service: Endoscopy;  Laterality: N/A;  . Whipple procedure  August 15th 2016    Denies any headaches, dizziness, double vision, fevers, chills, night sweats, nausea, vomiting, constipation, chest pain, heart palpitations, shortness of breath, blood in stool, black tarry stool, urinary pain, urinary burning, urinary frequency, hematuria.   PHYSICAL EXAMINATION  ECOG PERFORMANCE STATUS: 1 - Symptomatic but completely ambulatory  Filed Vitals:   08/22/15 1000  BP: 130/50  Pulse: 88  Temp: 97.8 F (36.6 C)  Resp: 16    GENERAL:alert, no distress, comfortable, cooperative, smiling and in  chemo-recliner, unaccompanied. SKIN: skin color, texture, turgor are normal, no rashes or significant lesions HEAD: Normocephalic, No masses, lesions, tenderness or abnormalities EYES: normal, PERRLA, EOMI, Conjunctiva are pink and non-injected EARS: External ears normal OROPHARYNX:lips, buccal mucosa, and tongue normal and mucous membranes are moist  NECK: supple, no adenopathy, trachea midline LYMPH:  no palpable lymphadenopathy BREAST:not examined LUNGS: clear to auscultation  HEART: regular rate & rhythm ABDOMEN:abdomen soft, normal bowel sounds and minimally tender as surgical site on palpation. BACK: Back symmetric, no curvature. EXTREMITIES:less then 2 second capillary refill, no joint deformities, effusion, or inflammation, no skin discoloration, no cyanosis , positive: B/L LE edema 3+ pitting pre-tibially. NEURO: alert & oriented x 3 with fluent speech, no focal motor/sensory deficits, gait normal    LABORATORY DATA: CBC    Component Value Date/Time   WBC 3.2* 08/22/2015 1020   WBC 7.9 10/31/2014 1347   RBC 2.88* 08/22/2015 1020   RBC 4.17 10/31/2014 1347   HGB 8.3* 08/22/2015 1020  HGB 12.2 10/31/2014 1347   HCT 23.2* 08/22/2015 1020   HCT 38.4 10/31/2014 1347   PLT 156 08/22/2015 1020   PLT 218 10/31/2014 1347   MCV 80.6 08/22/2015 1020   MCV 92 10/31/2014 1347   MCH 28.8 08/22/2015 1020   MCH 29.2 10/31/2014 1347   MCHC 35.8 08/22/2015 1020   MCHC 31.8* 10/31/2014 1347   RDW 17.4* 08/22/2015 1020   RDW 13.8 10/31/2014 1347   LYMPHSABS 1.5 08/22/2015 1020   LYMPHSABS 2.4 10/31/2014 1347   MONOABS 0.1 08/22/2015 1020   MONOABS 0.6 10/31/2014 1347   EOSABS 0.0 08/22/2015 1020   EOSABS 0.1 10/31/2014 1347   BASOSABS 0.0 08/22/2015 1020   BASOSABS 0.0 10/31/2014 1347      Chemistry      Component Value Date/Time   NA 139 08/22/2015 1020   NA 142 10/31/2014 1347   K 3.4* 08/22/2015 1020   K 3.5 10/31/2014 1347   CL 110 08/22/2015 1020   CL 108*  10/31/2014 1347   CO2 23 08/22/2015 1020   CO2 26 10/31/2014 1347   BUN 15 08/22/2015 1020   BUN 19* 10/31/2014 1347   CREATININE 1.28* 08/22/2015 1020   CREATININE 0.95 01/23/2015 1452   CREATININE 1.10 10/31/2014 1347      Component Value Date/Time   CALCIUM 8.2* 08/22/2015 1020   CALCIUM 9.3 10/31/2014 1347   ALKPHOS 83 08/22/2015 1020   ALKPHOS 88 10/31/2014 1347   AST 26 08/22/2015 1020   AST 35 10/31/2014 1347   ALT 23 08/22/2015 1020   ALT 26 10/31/2014 1347   BILITOT 0.9 08/22/2015 1020   BILITOT 0.4 10/31/2014 1347        PENDING LABS:   RADIOGRAPHIC STUDIES:  No results found.   PATHOLOGY:    ASSESSMENT AND PLAN:  Pancreatic cancer ([place]) Adenocarcinoma of the pancreas (T3N0M0) with positive ucinate margin, S/P whipple procedure by Dr. Eugenia Pancoast at [facility] on 06/05/2015.  Oncology history is up-to-date.  Labs on days 1, 8, 15: CBC diff, CMET  She held her Losartan last week for a BP that was low.  She was given a bolus of 500 cc of fluid last week as well. She reports that she took her Losartan this AM.  She is interested in taking every other day for now.  We will recheck her BP next week and depending on those results, we will make further recommendations.   She does not have a port.  We will try peripheral treatment first to see how well she tolerates treatment.  RX for Oxy IR is refilled which she takes for post-op abdominal pain.  This medication is effective for her.  Given her anemia, in addition to her B/L LE edema, we will type and screen her today and provide a 2 unit PRBC transfusion on Monday.  Orders are placed.  Return next week for day 15 of treatment, labs permitting, and follow-up.  Then return in 3 weeks for follow-up and to embark on cycle #2 of treatment.  If there are issues with Day 15 of therapy regarding lab results, would consider cancelling day #15 of treatment.   THERAPY PLAN:  Continue treatment as planned.  Depending on  tolerance and bone marrow toxicity, we may need to cancel day 15.  She will receive 6 months worth of treatment.  All questions were answered. The patient knows to call the clinic with any problems, questions or concerns. We can certainly see the patient much sooner if necessary.  Patient and plan discussed with Dr. Ancil Linsey and she is in agreement with the aforementioned.   This note is electronically signed by: Robynn Pane, PA-C 08/22/2015 11:22 AM

## 2015-08-22 NOTE — Assessment & Plan Note (Addendum)
Adenocarcinoma of the pancreas (T3N0M0) with positive ucinate margin, S/P whipple procedure by Dr. Eugenia Pancoast at [facility] on 06/05/2015.  Oncology history is up-to-date.  Labs on days 1, 8, 15: CBC diff, CMET  She held her Losartan last week for a BP that was low.  She was given a bolus of 500 cc of fluid last week as well. She reports that she took her Losartan this AM.  She is interested in taking every other day for now.  We will recheck her BP next week and depending on those results, we will make further recommendations.   She does not have a port.  We will try peripheral treatment first to see how well she tolerates treatment.  RX for Oxy IR is refilled which she takes for post-op abdominal pain.  This medication is effective for her.  Given her anemia, in addition to her B/L LE edema, we will type and screen her today and provide a 2 unit PRBC transfusion on Monday.  Orders are placed.  Return next week for day 15 of treatment, labs permitting, and follow-up.  Then return in 3 weeks for follow-up and to embark on cycle #2 of treatment.  If there are issues with Day 15 of therapy regarding lab results, would consider cancelling day #15 of treatment.

## 2015-08-22 NOTE — Progress Notes (Signed)
1415:  Tolerated tx w/o adverse reaction. VSS.  A&Ox4, in no distress. Discharged ambulatory.

## 2015-08-25 ENCOUNTER — Encounter (HOSPITAL_COMMUNITY): Payer: Medicare Other

## 2015-08-25 ENCOUNTER — Other Ambulatory Visit (HOSPITAL_COMMUNITY): Payer: Self-pay | Admitting: Oncology

## 2015-08-25 ENCOUNTER — Encounter (HOSPITAL_BASED_OUTPATIENT_CLINIC_OR_DEPARTMENT_OTHER): Payer: Medicare Other

## 2015-08-25 DIAGNOSIS — C250 Malignant neoplasm of head of pancreas: Secondary | ICD-10-CM | POA: Diagnosis not present

## 2015-08-25 LAB — PREPARE RBC (CROSSMATCH)

## 2015-08-25 LAB — ABO/RH: ABO/RH(D): O POS

## 2015-08-25 NOTE — Progress Notes (Signed)
Nurses attempted x7 to start an IV on the patient for blood transfusion.  Robynn Pane, PA made aware that we couldn't get IV access and the decision was made to place a port.  Scheduler, patient, and all other parties aware.  The patient will have a post placed and return to [place] for the transfusion.  Patient verbalized understanding and agreement.

## 2015-08-25 NOTE — Progress Notes (Signed)
Labs drawn for Tyscr

## 2015-08-26 ENCOUNTER — Ambulatory Visit (HOSPITAL_COMMUNITY)
Admission: RE | Admit: 2015-08-26 | Discharge: 2015-08-26 | Disposition: A | Discharge status: Home / Self Care | Payer: Medicare Other | Source: Ambulatory Visit | Attending: Oncology | Admitting: Oncology

## 2015-08-26 ENCOUNTER — Other Ambulatory Visit (HOSPITAL_COMMUNITY): Payer: Self-pay | Admitting: Oncology

## 2015-08-26 DIAGNOSIS — E559 Vitamin D deficiency, unspecified: Secondary | ICD-10-CM | POA: Insufficient documentation

## 2015-08-26 DIAGNOSIS — I10 Essential (primary) hypertension: Secondary | ICD-10-CM | POA: Insufficient documentation

## 2015-08-26 DIAGNOSIS — E213 Hyperparathyroidism, unspecified: Secondary | ICD-10-CM | POA: Insufficient documentation

## 2015-08-26 DIAGNOSIS — K219 Gastro-esophageal reflux disease without esophagitis: Secondary | ICD-10-CM | POA: Insufficient documentation

## 2015-08-26 DIAGNOSIS — E164 Increased secretion of gastrin: Secondary | ICD-10-CM | POA: Insufficient documentation

## 2015-08-26 DIAGNOSIS — Z880 Allergy status to penicillin: Secondary | ICD-10-CM | POA: Insufficient documentation

## 2015-08-26 DIAGNOSIS — Z885 Allergy status to narcotic agent status: Secondary | ICD-10-CM | POA: Diagnosis not present

## 2015-08-26 DIAGNOSIS — Z7982 Long term (current) use of aspirin: Secondary | ICD-10-CM | POA: Insufficient documentation

## 2015-08-26 DIAGNOSIS — E785 Hyperlipidemia, unspecified: Secondary | ICD-10-CM | POA: Diagnosis not present

## 2015-08-26 DIAGNOSIS — C250 Malignant neoplasm of head of pancreas: Secondary | ICD-10-CM

## 2015-08-26 DIAGNOSIS — J45909 Unspecified asthma, uncomplicated: Secondary | ICD-10-CM | POA: Insufficient documentation

## 2015-08-26 DIAGNOSIS — Z79899 Other long term (current) drug therapy: Secondary | ICD-10-CM | POA: Insufficient documentation

## 2015-08-26 DIAGNOSIS — C259 Malignant neoplasm of pancreas, unspecified: Secondary | ICD-10-CM | POA: Diagnosis not present

## 2015-08-26 LAB — GLUCOSE, CAPILLARY: GLUCOSE-CAPILLARY: 79 mg/dL (ref 65–99)

## 2015-08-26 MED ORDER — SODIUM CHLORIDE 0.9 % IV SOLN
INTRAVENOUS | Status: DC
  Administered 2015-08-26: 10:00:00 via INTRAVENOUS

## 2015-08-26 MED ORDER — LIDOCAINE-EPINEPHRINE (PF) 1 %-1:200000 IJ SOLN
INTRAMUSCULAR | Status: AC
  Filled 2015-08-26: qty 30

## 2015-08-26 MED ORDER — VANCOMYCIN HCL IN DEXTROSE 1-5 GM/200ML-% IV SOLN
1000.0000 mg | Freq: Once | INTRAVENOUS | Status: DC
  Filled 2015-08-26 (×2): qty 200

## 2015-08-26 MED ORDER — HEPARIN SOD (PORK) LOCK FLUSH 100 UNIT/ML IV SOLN
INTRAVENOUS | Status: AC
  Filled 2015-08-26: qty 5

## 2015-08-26 MED ORDER — LIDOCAINE HCL 1 % IJ SOLN
INTRAMUSCULAR | Status: AC
  Filled 2015-08-26: qty 20

## 2015-08-26 MED ORDER — MIDAZOLAM HCL 2 MG/2ML IJ SOLN
INTRAMUSCULAR | Status: AC
  Filled 2015-08-26: qty 2

## 2015-08-26 MED ORDER — MIDAZOLAM HCL 2 MG/2ML IJ SOLN
INTRAMUSCULAR | Status: AC | PRN
  Administered 2015-08-26 (×2): 0.5 mg via INTRAVENOUS

## 2015-08-26 MED ORDER — FENTANYL CITRATE (PF) 100 MCG/2ML IJ SOLN
INTRAMUSCULAR | Status: AC
  Filled 2015-08-26: qty 2

## 2015-08-26 MED ORDER — FENTANYL CITRATE (PF) 100 MCG/2ML IJ SOLN
INTRAMUSCULAR | Status: AC | PRN
  Administered 2015-08-26 (×2): 25 ug via INTRAVENOUS

## 2015-08-26 NOTE — Procedures (Signed)
Successful placement of right IJ approach port-a-cath with tip at the superior caval atrial junction. The catheter is ready for immediate use. No immediate post procedural complications.  Jay Keelan Tripodi, MD Pager #: [phone]   

## 2015-08-26 NOTE — Sedation Documentation (Signed)
Vital signs stable. 

## 2015-08-26 NOTE — Discharge Instructions (Signed)
Central Lines A central line is a soft, flexible tube (catheter) that is used to give medicine or nutrition through a person's veins. The tip of the central line ends in a large vein (superior vena cava) just above the person's heart. Medicine given through the central line is quickly mixed with blood because the blood flow within this large vein is so great. This dilutes the medicine so it is swiftly delivered throughout the body. Insertion of any type of central line is a sterile procedure.  A central line may be placed because:   You need medicine that would be irritating to the small veins in your hands or arms.   You need long-term IV medicines, such as antibiotics.   You need nutrition delivered through a central line.   You have veins in your hands or arms that are difficult to access.   You need frequent blood draws for lab tests.   You need dialysis. TYPES OF CENTRAL LINES There are different types of central lines. The type of central line you receive depends on how long it will be needed, your medical condition, and the condition of your veins. You might receive any of these types:   Tunneled central lines. These are normally inserted in the upper chest area. They are used for long-term therapy and may be used for dialysis. These lines are inserted and removed surgically.  Implanted ports. These are normally inserted in the upper chest but can also be placed in the upper arm or in the stomach area (abdomen). They are used for long-term therapy. Ports are inserted and removed surgically. The person's skin heals over the port insertion site. The port must be accessed using a special needle. RISKS AND COMPLICATIONS Any type of central line has risks that you should be aware of. Some of these include:   Infection.   Clotting of the central line.   Bleeding from the insertion site of the central line.   Developing a hole or crack within the central line. If this  happens, the central line will need to be replaced.   Developing an abnormal heart rhythm (rare). The heart may beat rapidly or "skip" beats.   A collapsed lung during insertion (rare). HOME CARE INSTRUCTIONS   Follow your health care provider's specific instructions for the type of device that you have.  Keep any bandages (dressings) over the central lineclean and dry. Wash your hands before a dressing change, and change dressings as directed by your health care provider. Look for redness or irritation at the insertion site during a dressing change.  Keep the insertion site of your central line clean at all times.   Wash your hands and clean the central line hub with rubbing alcohol before you flush it.   If the central line accidentally gets pulled on, make sure that the dressing is okay, that there is no bleeding, and that the line has not been pulled out.   Limit lifting, using your arm, or performing other activity as directed by your health care provider.  Swim or bathe only if your health care provider approves. Your health care provider can instruct you on how to keep your specific type of dressing from getting wet. SEEK IMMEDIATE MEDICAL CARE IF:   You have chills.  You have a fever.   You have shortness of breath or difficulty breathing.   You have chest pain.   You feel your heart beating rapidly or "skipping" beats.   You feel  dizzy or faint.  You have bleeding from the insertion site that does not stop.   You have pain, redness, swelling, or drainage at the insertion site of the central line.   You have swelling in your neck, face, chest, or arm on the side of your central line.   Your central line is difficult to flush or will not flush.  You do not get a blood return from the central line.  You have a hole or tear in the catheter.   Your catheter leaks when flushed or when fluids are infused into it. MAKE SURE YOU:   Understand these  instructions.  Will watch your condition.  Will get help right away if you are not doing well or get worse.   This information is not intended to replace advice given to you by your health care provider. Make sure you discuss any questions you have with your health care provider.   Document Released: 11/18/2005 Document Revised: 10/02/2013 Document Reviewed: 07/06/2012 Elsevier Interactive Patient Education [employer].

## 2015-08-26 NOTE — H&P (Signed)
Chief Complaint: Patient was seen in consultation today for port a cath placement  Referring Physician(s): Kefalas,Thomas /Penland,S  History of Present Illness: Veronica Stevenson is a 81 y.o. female with history of recently diagnosed pancreatic cancer , s/p Whipple procedure at [facility] on 05/16/15, and poor venous access who presents today for port a cath placement for chemotherapy.   Past Medical History  Diagnosis Date  . Essential hypertension   . Hyperlipidemia   . Asthmatic bronchitis   . Chronic pancreatitis ([place])   . Osteoarthritis of knee   . Atypical ductal hyperplasia of right breast   . Vitamin D deficiency   . GERD (gastroesophageal reflux disease)   . Hyperparathyroidism ([place])   . Zollinger-Ellison syndrome   . Carotid artery disease [facility])     Past Surgical History  Procedure Laterality Date  . Colonoscopy  2009    [place]  . Tonsillectomy    . Cataract extraction, bilateral Bilateral   . Parathyroidectomy    . Ovarian cyst removal    . Eus N/A 04/17/2015    Jacobs-1.7 cm x 2.5 cm mass in head of pancreas causing dilation of pancreatic duct and common bile duct.  . Ercp N/A 04/23/2015    [id number], heterogeneous,indistinctly bordered mass in head of pancreas  . Sphincterotomy N/A 04/23/2015    Procedure: SPHINCTEROTOMY;  Surgeon: Daneil Dolin, MD;  Location: [facility];  Service: Endoscopy;  Laterality: N/A;  . Biliary stent placement N/A 04/23/2015    Procedure: BILIARY STENT PLACEMENT;  Surgeon: Daneil Dolin, MD;  Location: [facility];  Service: Endoscopy;  Laterality: N/A;  . Whipple procedure  August 15th 2016    Allergies: Codeine; Omeprazole; and Penicillins  Medications: Prior to Admission medications   Medication Sig Start Date End Date Taking? Authorizing Provider  acetaminophen (TYLENOL) 500 MG tablet Take 500 mg by mouth every 6 (six) hours as needed for mild pain or moderate pain.   Yes Historical Provider, MD  aspirin EC 81 MG tablet Take  81 mg by mouth every morning.   Yes Historical Provider, MD  atorvastatin (LIPITOR) 10 MG tablet Take 10 mg by mouth daily after lunch.    Yes Historical Provider, MD  Cholecalciferol (VITAMIN D) 2000 UNITS CAPS Take 1 capsule by mouth every morning.    Yes Historical Provider, MD  furosemide (LASIX) 20 MG tablet Take 1 tablet (20 mg total) by mouth daily as needed. 08/06/15  Yes Manon Hilding Kefalas, PA-C  Gemcitabine HCl (GEMZAR IV) Inject into the vein. To start August 15, 2015.   Yes Historical Provider, MD  loperamide (IMODIUM A-D) 2 MG tablet Take 2 mg by mouth as needed for diarrhea or loose stools.   Yes Historical Provider, MD  losartan (COZAAR) 100 MG tablet Take 100 mg by mouth daily.  06/26/15  Yes Historical Provider, MD  metoCLOPramide (REGLAN) 10 MG tablet Take 10 mg by mouth 2 (two) times daily as needed for nausea. And at night 06/03/15  Yes Historical Provider, MD  metoprolol succinate (TOPROL-XL) 25 MG 24 hr tablet Take 25 mg by mouth every evening.    Yes Historical Provider, MD  oxyCODONE (OXY IR/ROXICODONE) 5 MG immediate release tablet Take 1 tablet (5 mg total) by mouth every 6 (six) hours as needed. 08/22/15   Baird Cancer, PA-C     Family History  Problem Relation Age of Onset  . CAD Mother   . Stroke Father   . Stroke Brother   .  Breast cancer Sister     Social History   Social History  . Marital Status: Single    Spouse Name: N/A  . Number of Children: N/A  . Years of Education: N/A   Social History Main Topics  . Smoking status: Never Smoker   . Smokeless tobacco: Never Used  . Alcohol Use: No  . Drug Use: No  . Sexual Activity: Not Currently   Other Topics Concern  . Not on file   Social History Narrative      Review of Systems  Constitutional: Positive for fatigue and unexpected weight change. Negative for fever and chills.  Respiratory: Negative for cough and shortness of breath.   Cardiovascular: Negative for chest pain.    Gastrointestinal: Positive for nausea, abdominal pain and diarrhea. Negative for vomiting and blood in stool.  Genitourinary: Negative for dysuria and hematuria.  Musculoskeletal: Negative for back pain.  Neurological: Negative for headaches.    Vital Signs: BP 132/69 mmHg  Pulse 105  Temp(Src) 98.1 F (36.7 C) (Oral)  Resp 16  Ht 5\' 4"  (1.626 m)  Wt 115 lb (52.164 kg)  BMI 19.73 kg/m2  SpO2 100%  Physical Exam  Constitutional: She is oriented to person, place, and time.  Thin BF in NAD  Cardiovascular: Regular rhythm.   Sl tachy  Pulmonary/Chest: Effort normal.  Dim BS rt base, left clear  Abdominal: Soft. Bowel sounds are normal. There is tenderness.  Musculoskeletal: Normal range of motion. She exhibits edema.  Neurological: She is alert and oriented to person, place, and time.    Mallampati Score:     Imaging: No results found.  Labs:  CBC:  Recent Labs  04/17/15 0857 07/18/15 0852 08/15/15 1014 08/22/15 1020  WBC 5.2 7.1 8.4 3.2*  HGB 12.9 10.8* 10.6* 8.3*  HCT 37.3 32.3* 30.8* 23.2*  PLT 260 241 262 156    COAGS: No results for input(s): INR, APTT in the last 8760 hours.  BMP:  Recent Labs  04/17/15 0857 07/18/15 0852 08/15/15 1014 08/22/15 1020  NA 136 141 139 139  K 4.0 3.0* 3.5 3.4*  CL 103 107 109 110  CO2 24 27 22 23   GLUCOSE 125* 94 119* 96  BUN 14 15 11 15   CALCIUM 9.4 8.0* 8.3* 8.2*  CREATININE 0.80 1.45* 1.05* 1.28*  GFRNONAA >60 33* 49* 38*  GFRAA >60 38* 57* 45*    LIVER FUNCTION TESTS:  Recent Labs  04/17/15 0857 07/18/15 0852 08/15/15 1014 08/22/15 1020  BILITOT 10.4* 1.0 1.0 0.9  AST 311* 35 46* 26  ALT 264* 23 32 23  ALKPHOS 581* 70 80 83  PROT 7.6 6.5 6.1* 6.1*  ALBUMIN 3.5 2.9* 2.4* 2.4*    TUMOR MARKERS:  Recent Labs  04/17/15 0857 07/18/15 0852  CA199 701* 49*    Assessment and Plan: Pt with hx pancreatic cancer (s/p Whipple 05/16/15) and poor venous access. Plan is for port a cath placement  today for chemotherapy. Risks and benefits discussed with the patient/sister including, but not limited to bleeding, infection, pneumothorax, or fibrin sheath development and need for additional procedures.All of the patient's questions were answered, patient is agreeable to proceed.Consent signed and in chart.     Thank you for this interesting consult.  I greatly enjoyed meeting ETHELL PELLO and look forward to participating in their care.  A copy of this report was sent to the requesting provider on this date.  Signed: D. Lennette Bihari Shauni Henner 08/26/2015, 10:07 AM  I spent a total of 15 minutes in face to face in clinical consultation, greater than 50% of which was counseling/coordinating care for port a cath placement

## 2015-08-26 NOTE — Progress Notes (Signed)
Dr. Pascal Lux notified of inability to successfully draw labs on pt.  Several attempts by RN and Lab staff. Pt has a CBC with diff from 11 -15-16. OK to proceed with pt to IR.  Heather in IR notified.

## 2015-08-26 NOTE — Sedation Documentation (Signed)
Patient is resting comfortably. 

## 2015-08-27 ENCOUNTER — Encounter (HOSPITAL_COMMUNITY): Payer: Self-pay

## 2015-08-27 ENCOUNTER — Encounter (HOSPITAL_BASED_OUTPATIENT_CLINIC_OR_DEPARTMENT_OTHER): Payer: Medicare Other

## 2015-08-27 VITALS — BP 135/53 | HR 78 | Temp 98.0°F | Resp 16

## 2015-08-27 DIAGNOSIS — C250 Malignant neoplasm of head of pancreas: Secondary | ICD-10-CM

## 2015-08-27 DIAGNOSIS — D649 Anemia, unspecified: Secondary | ICD-10-CM

## 2015-08-27 MED ORDER — SODIUM CHLORIDE 0.9 % IJ SOLN
10.0000 mL | INTRAMUSCULAR | Status: AC | PRN
  Administered 2015-08-27: 10 mL

## 2015-08-27 MED ORDER — LIDOCAINE-PRILOCAINE 2.5-2.5 % EX CREA: 1.0000 "application " | TOPICAL_CREAM | CUTANEOUS | Status: DC | PRN

## 2015-08-27 MED ORDER — DIPHENHYDRAMINE HCL 25 MG PO CAPS
ORAL_CAPSULE | ORAL | Status: AC
  Filled 2015-08-27: qty 1

## 2015-08-27 MED ORDER — ACETAMINOPHEN 325 MG PO TABS
ORAL_TABLET | ORAL | Status: AC
  Filled 2015-08-27: qty 2

## 2015-08-27 MED ORDER — DIPHENHYDRAMINE HCL 25 MG PO CAPS
25.0000 mg | ORAL_CAPSULE | Freq: Once | ORAL | Status: AC
  Administered 2015-08-27: 25 mg via ORAL

## 2015-08-27 MED ORDER — HEPARIN SOD (PORK) LOCK FLUSH 100 UNIT/ML IV SOLN
500.0000 [IU] | Freq: Every day | INTRAVENOUS | Status: AC | PRN
  Administered 2015-08-27: 500 [IU]
  Filled 2015-08-27: qty 5

## 2015-08-27 MED ORDER — SODIUM CHLORIDE 0.9 % IV SOLN
250.0000 mL | Freq: Once | INTRAVENOUS | Status: AC
  Administered 2015-08-27: 250 mL via INTRAVENOUS

## 2015-08-27 MED ORDER — ACETAMINOPHEN 325 MG PO TABS
650.0000 mg | ORAL_TABLET | Freq: Once | ORAL | Status: AC
  Administered 2015-08-27: 650 mg via ORAL

## 2015-08-27 NOTE — Assessment & Plan Note (Addendum)
Adenocarcinoma of the pancreas (T3N0M0) with positive ucinate margin, S/P whipple procedure by Dr. Eugenia Pancoast at [facility] on 06/05/2015.  Oncology history is updated.  She now has a port due to poor veinous access.  Labs today demonstrate a low ANC.  As a result, day 15 of treatment will be deleted, along with all subsequent cycles.  Oncology treatment plan is updated accordingly, along with oncology history.  Her port is healing nicely.  Return in 2 weeks for follow-up and to embark on cycle #2 of treatment.

## 2015-08-27 NOTE — Patient Instructions (Signed)
[place] at [facility] Discharge Instructions  RECOMMENDATIONS MADE BY THE CONSULTANT AND ANY TEST RESULTS WILL BE SENT TO YOUR REFERRING PHYSICIAN.  Today you received two units of blood. Return as scheduled for chemotherapy and office visit. Prescription for EMLA cream to numb port - place cream on skin over port 30-45 minutes prior to chemo appointments.  Thank you for choosing [place] at [facility] to provide your oncology and hematology care.  To afford each patient quality time with our provider, please arrive at least 15 minutes before your scheduled appointment time.    You need to re-schedule your appointment should you arrive 10 or more minutes late.  We strive to give you quality time with our providers, and arriving late affects you and other patients whose appointments are after yours.  Also, if you no show three or more times for appointments you may be dismissed from the clinic at the providers discretion.     Again, thank you for choosing [facility].  Our hope is that these requests will decrease the amount of time that you wait before being seen by our physicians.       _____________________________________________________________  Should you have questions after your visit to [facility], please contact our office at [phone] between the hours of 8:30 a.m. and 4:30 p.m.  Voicemails left after 4:30 p.m. will not be returned until the following business day.  For prescription refill requests, have your pharmacy contact our office.

## 2015-08-27 NOTE — Progress Notes (Signed)
[name], NP [address] [place] [address]  Malignant neoplasm of head of pancreas ([place]) - Plan: lidocaine-prilocaine (EMLA) cream  CURRENT THERAPY: Gemcitabine single-agent days 1, 8 every 28 days, beginning on 08/15/2015.    INTERVAL HISTORY: Veronica Stevenson 81 y.o. female returns for followup of Adenocarcinoma of the pancreas (T3N0M0) with positive ucinate margin, S/P whipple procedure by Dr. Eugenia Pancoast at [facility] on 06/05/2015.    Pancreatic cancer ([place])   04/17/2015 Tumor Marker Results for AUREA, ARONOV (MRN [id number]) as of 07/25/2015 09:24  04/17/2015 08:57 CA 19-9: 701 (H)    04/18/2015 Imaging CT abd/pelvis- Severe intrahepatic and extrahepatic biliary ductal and pancreatic ductal dilatation is noted secondary to 3 cm pancreatic head mass consistent with malignancy.   04/23/2015 Pathology Results BILE DUCT BRUSHING (SPECIMEN 1 OF 1 COLLECTED 04/23/2015) NONDIAGNOSTIC MATERIAL.   04/23/2015 Procedure ERCP- Dr. Ardis Hughs   06/05/2015 Surgery Dr. Eugenia Pancoast at [place] procedure demonstrating a T3N0 pancreatic mass with positive ucinate margin   07/18/2015 Tumor Marker Results for KAY, SHIPPY (MRN [id number]) as of 07/25/2015 09:24  07/18/2015 08:52 CA 19-9: 49 (H)    08/15/2015 -  Chemotherapy Gemcitabine single agent days 1, 8 every 28 days.   08/26/2015 Procedure Port placed by IR due to poor veinous access.   08/27/2015 Miscellaneous 2 unit PRBC transfusion   08/29/2015 Treatment Plan Change Deletion of day 15 of treatment due to low ANC on cycle 1 Day 15.   I personally reviewed and went over laboratory results with the patient.  The results are noted within this dictation.    She reports that she feels well.  She denies any complaints today.  She is educated on leukopenia secondary to systemic chemotherapy.   He LE edema is improved.  She notes that she feel mch better since her 2 unit PRBCs earlier this week.   Past Medical History  Diagnosis Date   . Essential hypertension   . Hyperlipidemia   . Asthmatic bronchitis   . Chronic pancreatitis ([place])   . Osteoarthritis of knee   . Atypical ductal hyperplasia of right breast   . Vitamin D deficiency   . GERD (gastroesophageal reflux disease)   . Hyperparathyroidism ([place])   . Zollinger-Ellison syndrome   . Carotid artery disease ([place])     has Hypertension; Hypercholesteremia; Acute pancreatitis; HLD (hyperlipidemia); Essential hypertension; Hyperglycemia; Anemia; Dyspepsia; Elevated lipase; Abnormal magnetic resonance cholangiopancreatography (MRCP); Loss of weight; Abdominal pain; Obstructive jaundice; Pancreatic cancer ([place]); and Malignant neoplasm of head of pancreas ([place]) on her problem list.     is allergic to codeine; omeprazole; and penicillins.  Current Outpatient Prescriptions on File Prior to Visit  Medication Sig Dispense Refill  . acetaminophen (TYLENOL) 500 MG tablet Take 500 mg by mouth every 6 (six) hours as needed for mild pain or moderate pain.    Marland Kitchen aspirin EC 81 MG tablet Take 81 mg by mouth every morning.    Marland Kitchen atorvastatin (LIPITOR) 10 MG tablet Take 10 mg by mouth daily after lunch.     . Cholecalciferol (VITAMIN D) 2000 UNITS CAPS Take 1 capsule by mouth every morning.     . furosemide (LASIX) 20 MG tablet Take 1 tablet (20 mg total) by mouth daily as needed. 15 tablet 1  . Gemcitabine HCl (GEMZAR IV) Inject into the vein. To start August 15, 2015.    . loperamide (IMODIUM A-D) 2 MG tablet Take 2 mg by mouth as  needed for diarrhea or loose stools.    Marland Kitchen losartan (COZAAR) 100 MG tablet Take 100 mg by mouth daily.     . metoCLOPramide (REGLAN) 10 MG tablet Take 10 mg by mouth 2 (two) times daily as needed for nausea. And at night  0  . metoprolol succinate (TOPROL-XL) 25 MG 24 hr tablet Take 25 mg by mouth every evening.     Marland Kitchen oxyCODONE (OXY IR/ROXICODONE) 5 MG immediate release tablet Take 1 tablet (5 mg total) by mouth every 6 (six) hours as needed. 45 tablet 0    No current facility-administered medications on file prior to visit.    Past Surgical History  Procedure Laterality Date  . Colonoscopy  2009    [place]  . Tonsillectomy    . Cataract extraction, bilateral Bilateral   . Parathyroidectomy    . Ovarian cyst removal    . Eus N/A 04/17/2015    Jacobs-1.7 cm x 2.5 cm mass in head of pancreas causing dilation of pancreatic duct and common bile duct.  . Ercp N/A 04/23/2015    OIN:OMVEHMCNOB, heterogeneous,indistinctly bordered mass in head of pancreas  . Sphincterotomy N/A 04/23/2015    Procedure: SPHINCTEROTOMY;  Surgeon: Daneil Dolin, MD;  Location: [facility];  Service: Endoscopy;  Laterality: N/A;  . Biliary stent placement N/A 04/23/2015    Procedure: BILIARY STENT PLACEMENT;  Surgeon: Daneil Dolin, MD;  Location: [facility];  Service: Endoscopy;  Laterality: N/A;  . Whipple procedure  August 15th 2016    Denies any headaches, dizziness, double vision, fevers, chills, night sweats, nausea, vomiting, constipation, chest pain, heart palpitations, shortness of breath, blood in stool, black tarry stool, urinary pain, urinary burning, urinary frequency, hematuria.   PHYSICAL EXAMINATION  ECOG PERFORMANCE STATUS: 1 - Symptomatic but completely ambulatory  There were no vitals filed for this visit.  GENERAL:alert, no distress, comfortable, cooperative, smiling and in chemo-bed, unaccompanied. SKIN: skin color, texture, turgor are normal, no rashes or significant lesions HEAD: Normocephalic, No masses, lesions, tenderness or abnormalities EYES: normal, PERRLA, EOMI, Conjunctiva are pink and non-injected EARS: External ears normal OROPHARYNX:lips, buccal mucosa, and tongue normal and mucous membranes are moist  NECK: supple, no adenopathy, trachea midline LYMPH:  no palpable lymphadenopathy BREAST:not examined LUNGS: clear to auscultation  HEART: regular rate & rhythm ABDOMEN:abdomen soft, normal bowel sounds and minimally tender as  surgical site on palpation. BACK: Back symmetric, no curvature. EXTREMITIES:less then 2 second capillary refill, no joint deformities, effusion, or inflammation, no skin discoloration, no cyanosis , positive: B/L LE edema 2+ pitting pre-tibially. NEURO: alert & oriented x 3 with fluent speech, no focal motor/sensory deficits, gait normal    LABORATORY DATA: CBC    Component Value Date/Time   WBC 2.4* 08/29/2015 0945   WBC 7.9 10/31/2014 1347   RBC 3.61* 08/29/2015 0945   RBC 4.17 10/31/2014 1347   HGB 10.4* 08/29/2015 0945   HGB 12.2 10/31/2014 1347   HCT 29.3* 08/29/2015 0945   HCT 38.4 10/31/2014 1347   PLT PENDING 08/29/2015 0945   PLT 218 10/31/2014 1347   MCV 81.2 08/29/2015 0945   MCV 92 10/31/2014 1347   MCH 28.8 08/29/2015 0945   MCH 29.2 10/31/2014 1347   MCHC 35.5 08/29/2015 0945   MCHC 31.8* 10/31/2014 1347   RDW 16.0* 08/29/2015 0945   RDW 13.8 10/31/2014 1347   LYMPHSABS 1.3 08/29/2015 0945   LYMPHSABS 2.4 10/31/2014 1347   MONOABS 0.0* 08/29/2015 0945   MONOABS 0.6 10/31/2014 1347  EOSABS 0.0 08/29/2015 0945   EOSABS 0.1 10/31/2014 1347   BASOSABS 0.0 08/29/2015 0945   BASOSABS 0.0 10/31/2014 1347      Chemistry      Component Value Date/Time   NA 139 08/29/2015 0945   NA 142 10/31/2014 1347   K 3.4* 08/29/2015 0945   K 3.5 10/31/2014 1347   CL 112* 08/29/2015 0945   CL 108* 10/31/2014 1347   CO2 22 08/29/2015 0945   CO2 26 10/31/2014 1347   BUN 14 08/29/2015 0945   BUN 19* 10/31/2014 1347   CREATININE 1.07* 08/29/2015 0945   CREATININE 0.95 01/23/2015 1452   CREATININE 1.10 10/31/2014 1347      Component Value Date/Time   CALCIUM 7.9* 08/29/2015 0945   CALCIUM 9.3 10/31/2014 1347   ALKPHOS 88 08/29/2015 0945   ALKPHOS 88 10/31/2014 1347   AST 20 08/29/2015 0945   AST 35 10/31/2014 1347   ALT 19 08/29/2015 0945   ALT 26 10/31/2014 1347   BILITOT 1.2 08/29/2015 0945   BILITOT 0.4 10/31/2014 1347        PENDING  LABS:   RADIOGRAPHIC STUDIES:  Ir Fluoro Guide Cv Line Right  08/26/2015  INDICATION: History of pancreatic cancer. In need of durable intravenous access for chemotherapy administration. EXAM: IMPLANTED PORT A CATH PLACEMENT WITH ULTRASOUND AND FLUOROSCOPIC GUIDANCE COMPARISON:  None. MEDICATIONS: Vancomycin 1 gm IV; The antibiotic was administered within an appropriate time interval prior to skin puncture. ANESTHESIA/SEDATION: Versed 1 mg IV; Fentanyl 50 mcg IV; Total Moderate Sedation Time 15  minutes. CONTRAST:  None FLUOROSCOPY TIME:  24 seconds (2.8 mGy) COMPLICATIONS: None immediate PROCEDURE: The procedure, risks, benefits, and alternatives were explained to the patient. Questions regarding the procedure were encouraged and answered. The patient understands and consents to the procedure. The right neck and chest were prepped with chlorhexidine in a sterile fashion, and a sterile drape was applied covering the operative field. Maximum barrier sterile technique with sterile gowns and gloves were used for the procedure. A timeout was performed prior to the initiation of the procedure. Local anesthesia was provided with 1% lidocaine with epinephrine. After creating a small venotomy incision, a micropuncture kit was utilized to access the internal jugular vein under direct, real-time ultrasound guidance. Ultrasound image documentation was performed. The microwire was kinked to measure appropriate catheter length. A subcutaneous port pocket was then created along the upper chest wall utilizing a combination of sharp and blunt dissection. The pocket was irrigated with sterile saline. A single lumen ISP power injectable port was chosen for placement. The 8 Fr catheter was tunneled from the port pocket site to the venotomy incision. The port was placed in the pocket. The external catheter was trimmed to appropriate length. At the venotomy, an 8 Fr peel-away sheath was placed over a guidewire under fluoroscopic  guidance. The catheter was then placed through the sheath and the sheath was removed. Final catheter positioning was confirmed and documented with a fluoroscopic spot radiograph. The port was accessed with a Huber needle, aspirated and flushed with heparinized saline. The venotomy site was closed with an interrupted 4-0 Vicryl suture. The port pocket incision was closed with interrupted 2-0 Vicryl suture and the skin was opposed with a running subcuticular 4-0 Vicryl suture. Dermabond and Steri-strips were applied to both incisions. Dressings were placed. The patient tolerated the procedure well without immediate post procedural complication. FINDINGS: After catheter placement, the tip lies within the superior cavoatrial junction. The catheter aspirates and flushes normally  and is ready for immediate use. IMPRESSION: Successful placement of a right internal jugular approach power injectable Port-A-Cath. The catheter is ready for immediate use. Electronically Signed   By: Sandi Mariscal M.D.   On: 08/26/2015 13:44   Ir US Guide Vasc Access Right  08/26/2015  INDICATION: History of pancreatic cancer. In need of durable intravenous access for chemotherapy administration. EXAM: IMPLANTED PORT A CATH PLACEMENT WITH ULTRASOUND AND FLUOROSCOPIC GUIDANCE COMPARISON:  None. MEDICATIONS: Vancomycin 1 gm IV; The antibiotic was administered within an appropriate time interval prior to skin puncture. ANESTHESIA/SEDATION: Versed 1 mg IV; Fentanyl 50 mcg IV; Total Moderate Sedation Time 15  minutes. CONTRAST:  None FLUOROSCOPY TIME:  24 seconds (2.8 mGy) COMPLICATIONS: None immediate PROCEDURE: The procedure, risks, benefits, and alternatives were explained to the patient. Questions regarding the procedure were encouraged and answered. The patient understands and consents to the procedure. The right neck and chest were prepped with chlorhexidine in a sterile fashion, and a sterile drape was applied covering the operative field.  Maximum barrier sterile technique with sterile gowns and gloves were used for the procedure. A timeout was performed prior to the initiation of the procedure. Local anesthesia was provided with 1% lidocaine with epinephrine. After creating a small venotomy incision, a micropuncture kit was utilized to access the internal jugular vein under direct, real-time ultrasound guidance. Ultrasound image documentation was performed. The microwire was kinked to measure appropriate catheter length. A subcutaneous port pocket was then created along the upper chest wall utilizing a combination of sharp and blunt dissection. The pocket was irrigated with sterile saline. A single lumen ISP power injectable port was chosen for placement. The 8 Fr catheter was tunneled from the port pocket site to the venotomy incision. The port was placed in the pocket. The external catheter was trimmed to appropriate length. At the venotomy, an 8 Fr peel-away sheath was placed over a guidewire under fluoroscopic guidance. The catheter was then placed through the sheath and the sheath was removed. Final catheter positioning was confirmed and documented with a fluoroscopic spot radiograph. The port was accessed with a Huber needle, aspirated and flushed with heparinized saline. The venotomy site was closed with an interrupted 4-0 Vicryl suture. The port pocket incision was closed with interrupted 2-0 Vicryl suture and the skin was opposed with a running subcuticular 4-0 Vicryl suture. Dermabond and Steri-strips were applied to both incisions. Dressings were placed. The patient tolerated the procedure well without immediate post procedural complication. FINDINGS: After catheter placement, the tip lies within the superior cavoatrial junction. The catheter aspirates and flushes normally and is ready for immediate use. IMPRESSION: Successful placement of a right internal jugular approach power injectable Port-A-Cath. The catheter is ready for immediate  use. Electronically Signed   By: Sandi Mariscal M.D.   On: 08/26/2015 13:44     PATHOLOGY:    ASSESSMENT AND PLAN:  Pancreatic cancer ([place]) Adenocarcinoma of the pancreas (T3N0M0) with positive ucinate margin, S/P whipple procedure by Dr. Eugenia Pancoast at [facility] on 06/05/2015.  Oncology history is updated.  She now has a port due to poor veinous access.  Labs today demonstrate a low ANC.  As a result, day 15 of treatment will be deleted, along with all subsequent cycles.  Oncology treatment plan is updated accordingly, along with oncology history.  Her port is healing nicely.  Return in 2 weeks for follow-up and to embark on cycle #2 of treatment.    THERAPY PLAN:  Continue treatment as planned.  Depending on tolerance and bone marrow toxicity, we may need to cancel day 15.  She will receive 6 months worth of treatment.  All questions were answered. The patient knows to call the clinic with any problems, questions or concerns. We can certainly see the patient much sooner if necessary.  Patient and plan discussed with Dr. Ancil Linsey and she is in agreement with the aforementioned.   This note is electronically signed by: Doy Mince 08/29/2015 10:47 AM

## 2015-08-27 NOTE — Progress Notes (Signed)
Tolerated transfusion w/o adverse reaction.  VSS.  A&ox4; in no distress.

## 2015-08-28 LAB — TYPE AND SCREEN
ABO/RH(D): O POS
Antibody Screen: NEGATIVE
Unit division: 0
Unit division: 0

## 2015-08-29 ENCOUNTER — Encounter (HOSPITAL_COMMUNITY): Payer: Medicare Other

## 2015-08-29 ENCOUNTER — Encounter (HOSPITAL_COMMUNITY): Payer: Self-pay

## 2015-08-29 ENCOUNTER — Encounter (HOSPITAL_BASED_OUTPATIENT_CLINIC_OR_DEPARTMENT_OTHER): Payer: Medicare Other | Admitting: Oncology

## 2015-08-29 DIAGNOSIS — C250 Malignant neoplasm of head of pancreas: Secondary | ICD-10-CM

## 2015-08-29 LAB — CBC WITH DIFFERENTIAL/PLATELET
Basophils Absolute: 0 10*3/uL (ref 0.0–0.1)
Basophils Relative: 0 %
EOS ABS: 0 10*3/uL (ref 0.0–0.7)
EOS PCT: 0 %
HCT: 29.3 % — ABNORMAL LOW (ref 36.0–46.0)
HEMOGLOBIN: 10.4 g/dL — AB (ref 12.0–15.0)
LYMPHS ABS: 1.3 10*3/uL (ref 0.7–4.0)
LYMPHS PCT: 52 %
MCH: 28.8 pg (ref 26.0–34.0)
MCHC: 35.5 g/dL (ref 30.0–36.0)
MCV: 81.2 fL (ref 78.0–100.0)
MONOS PCT: 1 %
Monocytes Absolute: 0 10*3/uL — ABNORMAL LOW (ref 0.1–1.0)
Neutro Abs: 1.1 10*3/uL — ABNORMAL LOW (ref 1.7–7.7)
Neutrophils Relative %: 47 %
PLATELETS: 80 10*3/uL — AB (ref 150–400)
RBC: 3.61 MIL/uL — AB (ref 3.87–5.11)
RDW: 16 % — ABNORMAL HIGH (ref 11.5–15.5)
WBC: 2.4 10*3/uL — ABNORMAL LOW (ref 4.0–10.5)

## 2015-08-29 LAB — COMPREHENSIVE METABOLIC PANEL
ALK PHOS: 88 U/L (ref 38–126)
ALT: 19 U/L (ref 14–54)
ANION GAP: 5 (ref 5–15)
AST: 20 U/L (ref 15–41)
Albumin: 2 g/dL — ABNORMAL LOW (ref 3.5–5.0)
BUN: 14 mg/dL (ref 6–20)
CO2: 22 mmol/L (ref 22–32)
Calcium: 7.9 mg/dL — ABNORMAL LOW (ref 8.9–10.3)
Chloride: 112 mmol/L — ABNORMAL HIGH (ref 101–111)
Creatinine, Ser: 1.07 mg/dL — ABNORMAL HIGH (ref 0.44–1.00)
GFR, EST AFRICAN AMERICAN: 55 mL/min — AB (ref >60)
GFR, EST NON AFRICAN AMERICAN: 48 mL/min — AB (ref >60)
Glucose, Bld: 97 mg/dL (ref 65–99)
Potassium: 3.4 mmol/L — ABNORMAL LOW (ref 3.5–5.1)
SODIUM: 139 mmol/L (ref 135–145)
Total Bilirubin: 1.2 mg/dL (ref 0.3–1.2)
Total Protein: 5.5 g/dL — ABNORMAL LOW (ref 6.5–8.1)

## 2015-08-29 MED ORDER — HEPARIN SOD (PORK) LOCK FLUSH 100 UNIT/ML IV SOLN
INTRAVENOUS | Status: AC
  Filled 2015-08-29: qty 5

## 2015-08-29 MED ORDER — HEPARIN SOD (PORK) LOCK FLUSH 100 UNIT/ML IV SOLN
500.0000 [IU] | Freq: Once | INTRAVENOUS | Status: AC
  Administered 2015-08-29: 500 [IU] via INTRAVENOUS

## 2015-08-29 MED ORDER — SODIUM CHLORIDE 0.9 % IJ SOLN
10.0000 mL | INTRAMUSCULAR | Status: AC | PRN
  Administered 2015-08-29: 10 mL via INTRAVENOUS
  Filled 2015-08-29: qty 10

## 2015-08-29 NOTE — Patient Instructions (Signed)
[place] at [facility] Discharge Instructions  RECOMMENDATIONS MADE BY THE CONSULTANT AND ANY TEST RESULTS WILL BE SENT TO YOUR REFERRING PHYSICIAN.  Hold chemotherapy today due to low white blood cell count. Treatment plan changed to Day 1 and Day 8 (no more Day 15). Return as scheduled on 09/12/15 for chemotherapy and office visit.   Thank you for choosing [place] at [facility] to provide your oncology and hematology care.  To afford each patient quality time with our provider, please arrive at least 15 minutes before your scheduled appointment time.    You need to re-schedule your appointment should you arrive 10 or more minutes late.  We strive to give you quality time with our providers, and arriving late affects you and other patients whose appointments are after yours.  Also, if you no show three or more times for appointments you may be dismissed from the clinic at the providers discretion.     Again, thank you for choosing [facility].  Our hope is that these requests will decrease the amount of time that you wait before being seen by our physicians.       _____________________________________________________________  Should you have questions after your visit to [facility], please contact our office at [phone] between the hours of 8:30 a.m. and 4:30 p.m.  Voicemails left after 4:30 p.m. will not be returned until the following business day.  For prescription refill requests, have your pharmacy contact our office.

## 2015-08-29 NOTE — Progress Notes (Unsigned)
Hold treatment today per Dr. Whitney Muse due to [facility] of 1,100.

## 2015-09-03 ENCOUNTER — Telehealth (HOSPITAL_COMMUNITY): Payer: Self-pay | Admitting: *Deleted

## 2015-09-03 NOTE — Telephone Encounter (Signed)
I called patient to see how she was doing but no answer.

## 2015-09-10 NOTE — Progress Notes (Signed)
[facility] Hematology/Oncology Consultation   Name: DELAYNI STREED      MRN: [id number]      REFERRING PHYSICIAN:  Manus Rudd, MD  REASON FOR CONSULT:  3 cm Pancreatic mass at head of pancreas   DIAGNOSIS:  Adenocarcinoma of the pancreas Whipple procedure with Dr. Eugenia Pancoast on 06/05/2015 Final pathology with adenocarcinoma, ductal type, moderately differentiated, 3.0 cm and invades into the duodenal muscularis propria. Neoplasm is present at inked uncinate margin and pancreatic margin. All other margins free. No LVI. Positive for perineural invasion. 0/10 LN involved Stage IIA, T3, N0, M0 CT C/A/P on 04/18/2015 with severe intrahepatic/extrahepatic biliary ductal and pancreatic ductal dilatation secondary to 3 cm pancreatic head mass Single Agent Gemzar started on 08/15/2015  HISTORY OF PRESENT ILLNESS:   Tanielle Emigh is a 81 year old woman here today for ongoing follow-up of resected pancreatic cancer. (Stage IIA, T3N0M0)  She had a whipple procedure but unfortunately had a positive margin after surgery.   She received only 2 treatments with Gemzar and has not tolerated therapy well. She has significant LE swelling, appetite is poor. Since 11/18 weight is up almost 10 pounds and it is secondary to edema.  She denies SOB or CP. She no longer has nausea or vomiting.   PAST MEDICAL HISTORY:   Past Medical History  Diagnosis Date  . Essential hypertension   . Hyperlipidemia   . Asthmatic bronchitis   . Chronic pancreatitis ([place])   . Osteoarthritis of knee   . Atypical ductal hyperplasia of right breast   . Vitamin D deficiency   . GERD (gastroesophageal reflux disease)   . Hyperparathyroidism ([place])   . Zollinger-Ellison syndrome   . Carotid artery disease ([facility])     ALLERGIES: Allergies  Allergen Reactions  . Codeine Itching  . Omeprazole Nausea And Vomiting  . Penicillins Other (See Comments)    "blacked out"      MEDICATIONS: I have reviewed the  patient's current medications.    Current Outpatient Prescriptions on File Prior to Visit  Medication Sig Dispense Refill  . acetaminophen (TYLENOL) 500 MG tablet Take 500 mg by mouth every 6 (six) hours as needed for mild pain or moderate pain.    Marland Kitchen aspirin EC 81 MG tablet Take 81 mg by mouth every morning.    Marland Kitchen atorvastatin (LIPITOR) 10 MG tablet Take 10 mg by mouth daily after lunch.     . Cholecalciferol (VITAMIN D) 2000 UNITS CAPS Take 1 capsule by mouth every morning.     . furosemide (LASIX) 20 MG tablet Take 1 tablet (20 mg total) by mouth daily as needed. 15 tablet 1  . Gemcitabine HCl (GEMZAR IV) Inject into the vein. To start August 15, 2015.    . lidocaine-prilocaine (EMLA) cream Apply 1 application topically as needed. 30 g 3  . loperamide (IMODIUM A-D) 2 MG tablet Take 2 mg by mouth as needed for diarrhea or loose stools.    Marland Kitchen losartan (COZAAR) 100 MG tablet Take 100 mg by mouth daily.     . metoCLOPramide (REGLAN) 10 MG tablet Take 10 mg by mouth 2 (two) times daily as needed for nausea. And at night  0  . metoprolol succinate (TOPROL-XL) 25 MG 24 hr tablet Take 25 mg by mouth every evening.     Marland Kitchen oxyCODONE (OXY IR/ROXICODONE) 5 MG immediate release tablet Take 1 tablet (5 mg total) by mouth every 6 (six) hours as needed. [place]  tablet 0   Current Facility-Administered Medications on File Prior to Visit  Medication Dose Route Frequency Provider Last Rate Last Dose  . sodium chloride 0.9 % injection 10 mL  10 mL Intravenous PRN Patrici Ranks, MD   10 mL at 08/29/15 0940     PAST SURGICAL HISTORY Past Surgical History  Procedure Laterality Date  . Colonoscopy  2009    [place]  . Tonsillectomy    . Cataract extraction, bilateral Bilateral   . Parathyroidectomy    . Ovarian cyst removal    . Eus N/A 04/17/2015    Jacobs-1.7 cm x 2.5 cm mass in head of pancreas causing dilation of pancreatic duct and common bile duct.  . Ercp N/A 04/23/2015    EXN:[id number],  heterogeneous,indistinctly bordered mass in head of pancreas  . Sphincterotomy N/A 04/23/2015    Procedure: SPHINCTEROTOMY;  Surgeon: Daneil Dolin, MD;  Location: [facility];  Service: Endoscopy;  Laterality: N/A;  . Biliary stent placement N/A 04/23/2015    Procedure: BILIARY STENT PLACEMENT;  Surgeon: Daneil Dolin, MD;  Location: [facility];  Service: Endoscopy;  Laterality: N/A;  . Whipple procedure  August 15th 2016    FAMILY HISTORY: Family History  Problem Relation Age of Onset  . CAD Mother   . Stroke Father   . Stroke Brother   . Breast cancer Sister    She has 1 brother and 1 sister still living.  She is 1 children out of 12 in total.  Her mother and father both passed from a stroke in their 43's.  Oldest sister passed from cancer, unknown type, at the age of 79 yo.  SOCIAL HISTORY:  reports that she has never smoked. She has never used smokeless tobacco. She reports that she does not drink alcohol or use illicit drugs.  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  She is never married.  She has no children.  However, she has nieces and nephews in the area.  She has 1 brother and 1 sister still living.  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  PERFORMANCE STATUS: The patient's performance status is 1 - Symptomatic but completely ambulatory  ROS Positive for leg swelling. 14 point review of systems was performed and is negative except as detailed under history of present illness and above  PHYSICAL EXAM: Most Recent Vital Signs:  Filed Vitals:   09/12/15 1053  BP: 113/66  Pulse: 79  Temp: 97.7 F (36.5 C)  Resp: 20   General appearance: alert, cooperative, appears stated age, icteric and no distress Thin Head: Normocephalic, without obvious abnormality, atraumatic Eyes: positive findings: scleral icterus Throat: lips, mucosa, and tongue normal; teeth and gums normal Neck: no adenopathy and supple,  symmetrical, trachea midline Lungs: clear to auscultation bilaterally and normal percussion bilaterally Heart: regular rate and rhythm, S1, S2 normal, no murmur, click, rub or gallop Abdomen: soft, non-tender; bowel sounds normal; no masses,  no organomegaly Extremities: extremities normal, atraumatic, no cyanosis   Pitting edema to thigh 1-2+;  Skin: Skin color, texture, turgor normal. No rashes or lesions Lymph nodes: Cervical, supraclavicular, and axillary nodes normal. Neurologic: Alert and oriented X 3, normal strength and tone. Normal symmetric reflexes. Normal coordination and gait  LABORATORY DATA: I have reviewed the labs below.  Results for TONJI, ELLIFF (MRN [id number])  Ref. Range 09/12/2015 12:40  Sodium Latest Ref Range: 135-145 mmol/L 141  Potassium Latest Ref Range: 3.5-5.1 mmol/L 3.7  Chloride Latest Ref Range: 101-111 mmol/L 112 (H)  CO2 Latest Ref Range: 22-32 mmol/L 22  BUN Latest Ref Range: 6-20 mg/dL 14  Creatinine Latest Ref Range: 0.44-1.00 mg/dL 1.46 (H)  Calcium Latest Ref Range: 8.9-10.3 mg/dL 8.1 (L)  EGFR (Non-African Amer.) Latest Ref Range: >60 mL/min 33 (L)  EGFR (African American) Latest Ref Range: >60 mL/min 38 (L)  Glucose Latest Ref Range: 65-99 mg/dL 92  Anion gap Latest Ref Range: 5-15  7  Alkaline Phosphatase Latest Ref Range: 38-126 U/L 109  Albumin Latest Ref Range: 3.5-5.0 g/dL 2.0 (L)  AST Latest Ref Range: 15-41 U/L 30  ALT Latest Ref Range: 14-54 U/L 22  Total Protein Latest Ref Range: 6.5-8.1 g/dL 5.4 (L)  Total Bilirubin Latest Ref Range: 0.3-1.2 mg/dL 0.7  WBC Latest Ref Range: 4.0-10.5 K/uL 8.2  RBC Latest Ref Range: 3.87-5.11 MIL/uL 3.68 (L)  Hemoglobin Latest Ref Range: 12.0-15.0 g/dL 10.6 (L)  HCT Latest Ref Range: 36.0-46.0 % 30.4 (L)  MCV Latest Ref Range: 78.0-100.0 fL 82.6  MCH Latest Ref Range: 26.0-34.0 pg 28.8  MCHC Latest Ref Range: 30.0-36.0 g/dL 34.9  RDW Latest Ref Range: 11.5-15.5 % 17.8 (H)  Platelets Latest Ref  Range: 150-400 K/uL 740 (H)  Neutrophils Latest Units: % 54  Lymphocytes Latest Units: % 38  Monocytes Relative Latest Units: % 8  Eosinophil Latest Units: % 0  Basophil Latest Units: % 0  NEUT# Latest Ref Range: 1.7-7.7 K/uL 4.4  Lymphocyte # Latest Ref Range: 0.7-4.0 K/uL 3.1  Monocyte # Latest Ref Range: 0.1-1.0 K/uL 0.7  Eosinophils Absolute Latest Ref Range: 0.0-0.7 K/uL 0.0  Basophils Absolute Latest Ref Range: 0.0-0.1 K/uL 0.0    Pre-op Ca 19-9  CA 19-9 0 - 35 U/mL 701 (H)        PATHOLOGY:     ASSESSMENT/PLAN:  Adenocarcinoma of the pancreas, T3 N0 M0, positive uncinate margin Whipple procedure with Dr. Eugenia Pancoast on 06/05/2015 Weight loss Lower extremity edema Hypoalbuminemia  She has not done well since surgery. Initially she made some improvements and was eventually started on adjuvant gemzar but only received 2 treatments. She has developed significant edema but has been off therapy for several weeks. Her nutritional status is poor. I suspect her edema is multifactorial. I am ordering an ECHO given her underlying disease and recent surgery.  She will return within the week. She is advised to call with additional problems or concerns.  She agrees to ongoing delay of therapy.   All questions were answered. The patient knows to call the clinic with any problems, questions or concerns. We can certainly see the patient much sooner if necessary.  This note is electronically signed by  Kelby Fam. Whitney Muse, MD

## 2015-09-12 ENCOUNTER — Ambulatory Visit (HOSPITAL_COMMUNITY)
Admission: RE | Admit: 2015-09-12 | Discharge: 2015-09-12 | Disposition: A | Discharge status: Home / Self Care | Payer: Medicare Other | Source: Ambulatory Visit | Attending: Hematology & Oncology | Admitting: Hematology & Oncology

## 2015-09-12 ENCOUNTER — Encounter (HOSPITAL_COMMUNITY): Payer: Medicare Other | Attending: Hematology & Oncology

## 2015-09-12 ENCOUNTER — Encounter (HOSPITAL_BASED_OUTPATIENT_CLINIC_OR_DEPARTMENT_OTHER): Payer: Medicare Other | Admitting: Hematology & Oncology

## 2015-09-12 VITALS — BP 113/66 | HR 79 | Temp 97.7°F | Resp 20 | Wt 127.4 lb

## 2015-09-12 VITALS — BP 111/69 | HR 80 | Temp 98.2°F | Resp 16

## 2015-09-12 DIAGNOSIS — D649 Anemia, unspecified: Secondary | ICD-10-CM | POA: Diagnosis not present

## 2015-09-12 DIAGNOSIS — R634 Abnormal weight loss: Secondary | ICD-10-CM | POA: Diagnosis not present

## 2015-09-12 DIAGNOSIS — C250 Malignant neoplasm of head of pancreas: Secondary | ICD-10-CM

## 2015-09-12 DIAGNOSIS — I82611 Acute embolism and thrombosis of superficial veins of right upper extremity: Secondary | ICD-10-CM | POA: Diagnosis not present

## 2015-09-12 DIAGNOSIS — E8809 Other disorders of plasma-protein metabolism, not elsewhere classified: Secondary | ICD-10-CM | POA: Diagnosis not present

## 2015-09-12 DIAGNOSIS — C259 Malignant neoplasm of pancreas, unspecified: Secondary | ICD-10-CM

## 2015-09-12 DIAGNOSIS — M7989 Other specified soft tissue disorders: Secondary | ICD-10-CM

## 2015-09-12 DIAGNOSIS — R600 Localized edema: Secondary | ICD-10-CM

## 2015-09-12 LAB — CBC WITH DIFFERENTIAL/PLATELET
BASOS PCT: 0 %
Basophils Absolute: 0 10*3/uL (ref 0.0–0.1)
EOS ABS: 0 10*3/uL (ref 0.0–0.7)
EOS PCT: 0 %
HCT: 30.4 % — ABNORMAL LOW (ref 36.0–46.0)
HEMOGLOBIN: 10.6 g/dL — AB (ref 12.0–15.0)
LYMPHS ABS: 3.1 10*3/uL (ref 0.7–4.0)
Lymphocytes Relative: 38 %
MCH: 28.8 pg (ref 26.0–34.0)
MCHC: 34.9 g/dL (ref 30.0–36.0)
MCV: 82.6 fL (ref 78.0–100.0)
Monocytes Absolute: 0.7 10*3/uL (ref 0.1–1.0)
Monocytes Relative: 8 %
NEUTROS PCT: 54 %
Neutro Abs: 4.4 10*3/uL (ref 1.7–7.7)
PLATELETS: 740 10*3/uL — AB (ref 150–400)
RBC: 3.68 MIL/uL — AB (ref 3.87–5.11)
RDW: 17.8 % — ABNORMAL HIGH (ref 11.5–15.5)
WBC: 8.2 10*3/uL (ref 4.0–10.5)

## 2015-09-12 LAB — COMPREHENSIVE METABOLIC PANEL
ALBUMIN: 2 g/dL — AB (ref 3.5–5.0)
ALT: 22 U/L (ref 14–54)
ANION GAP: 7 (ref 5–15)
AST: 30 U/L (ref 15–41)
Alkaline Phosphatase: 109 U/L (ref 38–126)
BUN: 14 mg/dL (ref 6–20)
CHLORIDE: 112 mmol/L — AB (ref 101–111)
CO2: 22 mmol/L (ref 22–32)
Calcium: 8.1 mg/dL — ABNORMAL LOW (ref 8.9–10.3)
Creatinine, Ser: 1.46 mg/dL — ABNORMAL HIGH (ref 0.44–1.00)
GFR calc non Af Amer: 33 mL/min — ABNORMAL LOW (ref >60)
GFR, EST AFRICAN AMERICAN: 38 mL/min — AB (ref >60)
GLUCOSE: 92 mg/dL (ref 65–99)
Potassium: 3.7 mmol/L (ref 3.5–5.1)
SODIUM: 141 mmol/L (ref 135–145)
Total Bilirubin: 0.7 mg/dL (ref 0.3–1.2)
Total Protein: 5.4 g/dL — ABNORMAL LOW (ref 6.5–8.1)

## 2015-09-12 MED ORDER — HEPARIN SOD (PORK) LOCK FLUSH 100 UNIT/ML IV SOLN
500.0000 [IU] | Freq: Once | INTRAVENOUS | Status: AC
  Administered 2015-09-12: 500 [IU] via INTRAVENOUS

## 2015-09-12 MED ORDER — SODIUM CHLORIDE 0.9 % IJ SOLN
10.0000 mL | INTRAMUSCULAR | Status: DC | PRN
  Administered 2015-09-12 (×2): 10 mL via INTRAVENOUS
  Filled 2015-09-12 (×2): qty 10

## 2015-09-12 NOTE — Patient Instructions (Addendum)
[place] at [facility] Discharge Instructions  RECOMMENDATIONS MADE BY THE CONSULTANT AND ANY TEST RESULTS WILL BE SENT TO YOUR REFERRING PHYSICIAN.   Exam completed by Dr Whitney Muse today Echo next week Chemo as scheduled Return to see the doctor next week Please call the clinic if you have any questions or concerns  Thank you for choosing [place] at [facility] to provide your oncology and hematology care.  To afford each patient quality time with our provider, please arrive at least 15 minutes before your scheduled appointment time.    You need to re-schedule your appointment should you arrive 10 or more minutes late.  We strive to give you quality time with our providers, and arriving late affects you and other patients whose appointments are after yours.  Also, if you no show three or more times for appointments you may be dismissed from the clinic at the providers discretion.     Again, thank you for choosing [facility].  Our hope is that these requests will decrease the amount of time that you wait before being seen by our physicians.       _____________________________________________________________  Should you have questions after your visit to [facility], please contact our office at [phone] between the hours of 8:30 a.m. and 4:30 p.m.  Voicemails left after 4:30 p.m. will not be returned until the following business day.  For prescription refill requests, have your pharmacy contact our office.

## 2015-09-12 NOTE — Progress Notes (Signed)
Patient chemotherapy held today due to lab results, per Dr. Sharmon Cheramie Muse and Robynn Pane, PA.  Patient to return in one week for possible treatment and follow up with the MD.  She was to start taking Xarelto per instructions in the monthly started pack.  The patient was educated with this medication and took her first dose today before leaving.  She was able to verbalize how and when to take the medication.  I educated her to call us with any worsening of swelling, shortness of breath, pain in the extremity, or if the swelling in her breast and arm didn't start to resolve.  Patient was given her schedule and informed of her echo that has been scheduled.  She was wheeled out in a wheelchair by her family member.

## 2015-09-12 NOTE — Progress Notes (Signed)
Patient wheeled to radiology by Hildred Alamin, RN.

## 2015-09-12 NOTE — Progress Notes (Signed)
Right breast and right arm are swollen. Port to be accessed per Dr. Donald Pore orders. Weight gain of 9 lbs since 08/29/15. Dr. Whitney Muse aware. Patient's incision to port area is not completely closed. Steri strips were basically off of incision and just stuck to skin. 2 new steri strips applied crossing the incision in an X fashion. Kurtis Bushman RN, Josephina Shih RN observed this area. Dr. Whitney Muse aware.

## 2015-09-12 NOTE — Patient Instructions (Signed)
[place] at [facility] Discharge Instructions  RECOMMENDATIONS MADE BY THE CONSULTANT AND ANY TEST RESULTS WILL BE SENT TO YOUR REFERRING PHYSICIAN.  Return as scheduled in one week to see the MD and for possible treatment. Continue to take the Xarelto medication in the starter pack as discussed.   Please call us with any worsening in your breast or arm, or if the swelling doesn't resolve.    Thank you for choosing [place] at [facility] to provide your oncology and hematology care.  To afford each patient quality time with our provider, please arrive at least 15 minutes before your scheduled appointment time.    You need to re-schedule your appointment should you arrive 10 or more minutes late.  We strive to give you quality time with our providers, and arriving late affects you and other patients whose appointments are after yours.  Also, if you no show three or more times for appointments you may be dismissed from the clinic at the providers discretion.     Again, thank you for choosing [facility].  Our hope is that these requests will decrease the amount of time that you wait before being seen by our physicians.       _____________________________________________________________  Should you have questions after your visit to [facility], please contact our office at [phone] between the hours of 8:30 a.m. and 4:30 p.m.  Voicemails left after 4:30 p.m. will not be returned until the following business day.  For prescription refill requests, have your pharmacy contact our office.

## 2015-09-15 ENCOUNTER — Ambulatory Visit (HOSPITAL_BASED_OUTPATIENT_CLINIC_OR_DEPARTMENT_OTHER)
Admission: RE | Admit: 2015-09-15 | Discharge: 2015-09-15 | Disposition: A | Discharge status: Home / Self Care | Payer: Medicare Other | Source: Ambulatory Visit | Attending: Hematology & Oncology | Admitting: Hematology & Oncology

## 2015-09-15 DIAGNOSIS — Z9221 Personal history of antineoplastic chemotherapy: Secondary | ICD-10-CM | POA: Insufficient documentation

## 2015-09-15 DIAGNOSIS — I10 Essential (primary) hypertension: Secondary | ICD-10-CM | POA: Insufficient documentation

## 2015-09-15 DIAGNOSIS — Z08 Encounter for follow-up examination after completed treatment for malignant neoplasm: Secondary | ICD-10-CM | POA: Insufficient documentation

## 2015-09-15 DIAGNOSIS — C250 Malignant neoplasm of head of pancreas: Secondary | ICD-10-CM

## 2015-09-18 ENCOUNTER — Inpatient Hospital Stay (HOSPITAL_COMMUNITY)
Admission: EM | Admit: 2015-09-18 | Discharge: 2015-09-23 | DRG: 947 | Disposition: A | Discharge status: Skilled Nursing Facility | Payer: Medicare Other | Attending: Internal Medicine | Admitting: Internal Medicine

## 2015-09-18 ENCOUNTER — Encounter (HOSPITAL_COMMUNITY): Payer: Self-pay | Admitting: Emergency Medicine

## 2015-09-18 ENCOUNTER — Emergency Department (HOSPITAL_COMMUNITY): Payer: Medicare Other

## 2015-09-18 DIAGNOSIS — E872 Acidosis: Secondary | ICD-10-CM | POA: Diagnosis present

## 2015-09-18 DIAGNOSIS — R600 Localized edema: Secondary | ICD-10-CM | POA: Diagnosis present

## 2015-09-18 DIAGNOSIS — M4806 Spinal stenosis, lumbar region: Secondary | ICD-10-CM | POA: Diagnosis present

## 2015-09-18 DIAGNOSIS — E785 Hyperlipidemia, unspecified: Secondary | ICD-10-CM | POA: Diagnosis present

## 2015-09-18 DIAGNOSIS — M179 Osteoarthritis of knee, unspecified: Secondary | ICD-10-CM | POA: Diagnosis present

## 2015-09-18 DIAGNOSIS — E876 Hypokalemia: Secondary | ICD-10-CM | POA: Diagnosis present

## 2015-09-18 DIAGNOSIS — R1013 Epigastric pain: Secondary | ICD-10-CM

## 2015-09-18 DIAGNOSIS — Z8249 Family history of ischemic heart disease and other diseases of the circulatory system: Secondary | ICD-10-CM | POA: Diagnosis not present

## 2015-09-18 DIAGNOSIS — N179 Acute kidney failure, unspecified: Secondary | ICD-10-CM | POA: Diagnosis present

## 2015-09-18 DIAGNOSIS — W19XXXA Unspecified fall, initial encounter: Secondary | ICD-10-CM | POA: Diagnosis not present

## 2015-09-18 DIAGNOSIS — E118 Type 2 diabetes mellitus with unspecified complications: Secondary | ICD-10-CM | POA: Diagnosis not present

## 2015-09-18 DIAGNOSIS — N183 Chronic kidney disease, stage 3 unspecified: Secondary | ICD-10-CM | POA: Diagnosis present

## 2015-09-18 DIAGNOSIS — E1122 Type 2 diabetes mellitus with diabetic chronic kidney disease: Secondary | ICD-10-CM | POA: Diagnosis present

## 2015-09-18 DIAGNOSIS — R531 Weakness: Secondary | ICD-10-CM

## 2015-09-18 DIAGNOSIS — Z86718 Personal history of other venous thrombosis and embolism: Secondary | ICD-10-CM | POA: Diagnosis not present

## 2015-09-18 DIAGNOSIS — R609 Edema, unspecified: Secondary | ICD-10-CM | POA: Diagnosis not present

## 2015-09-18 DIAGNOSIS — K831 Obstruction of bile duct: Secondary | ICD-10-CM

## 2015-09-18 DIAGNOSIS — Z823 Family history of stroke: Secondary | ICD-10-CM | POA: Diagnosis not present

## 2015-09-18 DIAGNOSIS — D72829 Elevated white blood cell count, unspecified: Secondary | ICD-10-CM | POA: Diagnosis present

## 2015-09-18 DIAGNOSIS — Z923 Personal history of irradiation: Secondary | ICD-10-CM | POA: Diagnosis not present

## 2015-09-18 DIAGNOSIS — T451X5A Adverse effect of antineoplastic and immunosuppressive drugs, initial encounter: Secondary | ICD-10-CM | POA: Diagnosis present

## 2015-09-18 DIAGNOSIS — E1121 Type 2 diabetes mellitus with diabetic nephropathy: Secondary | ICD-10-CM | POA: Diagnosis present

## 2015-09-18 DIAGNOSIS — R933 Abnormal findings on diagnostic imaging of other parts of digestive tract: Secondary | ICD-10-CM

## 2015-09-18 DIAGNOSIS — D638 Anemia in other chronic diseases classified elsewhere: Secondary | ICD-10-CM | POA: Diagnosis not present

## 2015-09-18 DIAGNOSIS — D630 Anemia in neoplastic disease: Secondary | ICD-10-CM | POA: Diagnosis present

## 2015-09-18 DIAGNOSIS — M7989 Other specified soft tissue disorders: Secondary | ICD-10-CM | POA: Diagnosis not present

## 2015-09-18 DIAGNOSIS — J45909 Unspecified asthma, uncomplicated: Secondary | ICD-10-CM | POA: Diagnosis present

## 2015-09-18 DIAGNOSIS — I10 Essential (primary) hypertension: Secondary | ICD-10-CM | POA: Diagnosis not present

## 2015-09-18 DIAGNOSIS — N182 Chronic kidney disease, stage 2 (mild): Secondary | ICD-10-CM | POA: Diagnosis present

## 2015-09-18 DIAGNOSIS — R748 Abnormal levels of other serum enzymes: Secondary | ICD-10-CM

## 2015-09-18 DIAGNOSIS — Z803 Family history of malignant neoplasm of breast: Secondary | ICD-10-CM | POA: Diagnosis not present

## 2015-09-18 DIAGNOSIS — I129 Hypertensive chronic kidney disease with stage 1 through stage 4 chronic kidney disease, or unspecified chronic kidney disease: Secondary | ICD-10-CM | POA: Diagnosis present

## 2015-09-18 DIAGNOSIS — E7800 Pure hypercholesterolemia, unspecified: Secondary | ICD-10-CM

## 2015-09-18 DIAGNOSIS — E43 Unspecified severe protein-calorie malnutrition: Secondary | ICD-10-CM | POA: Diagnosis present

## 2015-09-18 DIAGNOSIS — T380X5A Adverse effect of glucocorticoids and synthetic analogues, initial encounter: Secondary | ICD-10-CM | POA: Diagnosis present

## 2015-09-18 DIAGNOSIS — Z6822 Body mass index (BMI) 22.0-22.9, adult: Secondary | ICD-10-CM | POA: Diagnosis not present

## 2015-09-18 DIAGNOSIS — D689 Coagulation defect, unspecified: Secondary | ICD-10-CM | POA: Diagnosis present

## 2015-09-18 DIAGNOSIS — C250 Malignant neoplasm of head of pancreas: Secondary | ICD-10-CM

## 2015-09-18 DIAGNOSIS — D631 Anemia in chronic kidney disease: Secondary | ICD-10-CM | POA: Diagnosis present

## 2015-09-18 DIAGNOSIS — Z90411 Acquired partial absence of pancreas: Secondary | ICD-10-CM

## 2015-09-18 DIAGNOSIS — K219 Gastro-esophageal reflux disease without esophagitis: Secondary | ICD-10-CM | POA: Diagnosis present

## 2015-09-18 DIAGNOSIS — Z9221 Personal history of antineoplastic chemotherapy: Secondary | ICD-10-CM

## 2015-09-18 DIAGNOSIS — D649 Anemia, unspecified: Secondary | ICD-10-CM | POA: Diagnosis present

## 2015-09-18 DIAGNOSIS — M48061 Spinal stenosis, lumbar region without neurogenic claudication: Secondary | ICD-10-CM

## 2015-09-18 DIAGNOSIS — R739 Hyperglycemia, unspecified: Secondary | ICD-10-CM

## 2015-09-18 DIAGNOSIS — Z7982 Long term (current) use of aspirin: Secondary | ICD-10-CM

## 2015-09-18 DIAGNOSIS — R634 Abnormal weight loss: Secondary | ICD-10-CM

## 2015-09-18 HISTORY — DX: Spinal stenosis, lumbar region without neurogenic claudication: M48.061

## 2015-09-18 LAB — COMPREHENSIVE METABOLIC PANEL
ALBUMIN: 1.8 g/dL — AB (ref 3.5–5.0)
ALT: 34 U/L (ref 14–54)
ANION GAP: 12 (ref 5–15)
AST: 55 U/L — ABNORMAL HIGH (ref 15–41)
Alkaline Phosphatase: 91 U/L (ref 38–126)
BILIRUBIN TOTAL: 1 mg/dL (ref 0.3–1.2)
BUN: 25 mg/dL — ABNORMAL HIGH (ref 6–20)
CO2: 16 mmol/L — ABNORMAL LOW (ref 22–32)
Calcium: 8 mg/dL — ABNORMAL LOW (ref 8.9–10.3)
Chloride: 114 mmol/L — ABNORMAL HIGH (ref 101–111)
Creatinine, Ser: 2.01 mg/dL — ABNORMAL HIGH (ref 0.44–1.00)
GFR calc Af Amer: 26 mL/min — ABNORMAL LOW (ref >60)
GFR, EST NON AFRICAN AMERICAN: 22 mL/min — AB (ref >60)
Glucose, Bld: 102 mg/dL — ABNORMAL HIGH (ref 65–99)
POTASSIUM: 3.1 mmol/L — AB (ref 3.5–5.1)
Sodium: 142 mmol/L (ref 135–145)
TOTAL PROTEIN: 4.9 g/dL — AB (ref 6.5–8.1)

## 2015-09-18 LAB — CBC WITH DIFFERENTIAL/PLATELET
BASOS PCT: 0 %
Basophils Absolute: 0 10*3/uL (ref 0.0–0.1)
Eosinophils Absolute: 0 10*3/uL (ref 0.0–0.7)
Eosinophils Relative: 0 %
HEMATOCRIT: 25.6 % — AB (ref 36.0–46.0)
Hemoglobin: 9.2 g/dL — ABNORMAL LOW (ref 12.0–15.0)
Lymphocytes Relative: 7 %
Lymphs Abs: 1.3 10*3/uL (ref 0.7–4.0)
MCH: 28.8 pg (ref 26.0–34.0)
MCHC: 35.9 g/dL (ref 30.0–36.0)
MCV: 80.3 fL (ref 78.0–100.0)
MONO ABS: 1.7 10*3/uL — AB (ref 0.1–1.0)
MONOS PCT: 9 %
NEUTROS ABS: 15.9 10*3/uL — AB (ref 1.7–7.7)
Neutrophils Relative %: 84 %
Platelets: 410 10*3/uL — ABNORMAL HIGH (ref 150–400)
RBC: 3.19 MIL/uL — ABNORMAL LOW (ref 3.87–5.11)
RDW: 17.6 % — AB (ref 11.5–15.5)
WBC: 18.8 10*3/uL — ABNORMAL HIGH (ref 4.0–10.5)

## 2015-09-18 LAB — URINALYSIS, ROUTINE W REFLEX MICROSCOPIC
Bilirubin Urine: NEGATIVE
Glucose, UA: NEGATIVE mg/dL
Ketones, ur: NEGATIVE mg/dL
LEUKOCYTES UA: NEGATIVE
Nitrite: NEGATIVE
Specific Gravity, Urine: >1.03 — ABNORMAL HIGH (ref 1.005–1.030)
pH: 5.5 (ref 5.0–8.0)

## 2015-09-18 LAB — LACTIC ACID, PLASMA
Lactic Acid, Venous: 1.6 mmol/L (ref 0.5–2.0)
Lactic Acid, Venous: 2.9 mmol/L (ref 0.5–2.0)

## 2015-09-18 LAB — URINE MICROSCOPIC-ADD ON

## 2015-09-18 MED ORDER — METOPROLOL SUCCINATE ER 25 MG PO TB24
25.0000 mg | ORAL_TABLET | Freq: Every evening | ORAL | Status: DC
  Administered 2015-09-21 – 2015-09-22 (×2): 25 mg via ORAL
  Filled 2015-09-18 (×4): qty 1

## 2015-09-18 MED ORDER — HEPARIN (PORCINE) IN NACL 100-0.45 UNIT/ML-% IJ SOLN: 950.0000 [IU]/h | INTRAMUSCULAR | Status: DC

## 2015-09-18 MED ORDER — ATORVASTATIN CALCIUM 10 MG PO TABS
10.0000 mg | ORAL_TABLET | Freq: Every day | ORAL | Status: DC
  Administered 2015-09-18 – 2015-09-22 (×5): 10 mg via ORAL
  Filled 2015-09-18 (×4): qty 1

## 2015-09-18 MED ORDER — FUROSEMIDE 10 MG/ML IJ SOLN: 40.0000 mg | Freq: Two times a day (BID) | INTRAMUSCULAR | Status: DC

## 2015-09-18 MED ORDER — HEPARIN (PORCINE) IN NACL 100-0.45 UNIT/ML-% IJ SOLN
950.0000 [IU]/h | INTRAMUSCULAR | Status: DC
  Administered 2015-09-19: 950 [IU]/h via INTRAVENOUS
  Filled 2015-09-18: qty 250

## 2015-09-18 MED ORDER — VANCOMYCIN HCL IN DEXTROSE 1-5 GM/200ML-% IV SOLN
1000.0000 mg | Freq: Once | INTRAVENOUS | Status: AC
  Administered 2015-09-18: 1000 mg via INTRAVENOUS
  Filled 2015-09-18: qty 200

## 2015-09-18 MED ORDER — HYDROMORPHONE HCL 1 MG/ML IJ SOLN
1.0000 mg | INTRAMUSCULAR | Status: DC | PRN
Start: 2015-09-18 — End: 2015-09-20
  Administered 2015-09-18 – 2015-09-20 (×6): 1 mg via INTRAVENOUS
  Filled 2015-09-18 (×7): qty 1

## 2015-09-18 MED ORDER — PIPERACILLIN-TAZOBACTAM IN DEX 2-0.25 GM/50ML IV SOLN
2.2500 g | Freq: Three times a day (TID) | INTRAVENOUS | Status: DC
  Administered 2015-09-19 (×2): 2.25 g via INTRAVENOUS
  Filled 2015-09-18 (×3): qty 50

## 2015-09-18 MED ORDER — ASPIRIN EC 81 MG PO TBEC
81.0000 mg | DELAYED_RELEASE_TABLET | Freq: Every day | ORAL | Status: DC
  Administered 2015-09-19 – 2015-09-23 (×5): 81 mg via ORAL
  Filled 2015-09-18 (×5): qty 1

## 2015-09-18 MED ORDER — SODIUM CHLORIDE 0.9 % IV BOLUS (SEPSIS)
500.0000 mL | Freq: Once | INTRAVENOUS | Status: AC
  Administered 2015-09-18: 500 mL via INTRAVENOUS

## 2015-09-18 MED ORDER — HEPARIN BOLUS VIA INFUSION
3000.0000 [IU] | Freq: Once | INTRAVENOUS | Status: DC
  Filled 2015-09-18: qty 3000

## 2015-09-18 MED ORDER — PIPERACILLIN-TAZOBACTAM IN DEX 2-0.25 GM/50ML IV SOLN
INTRAVENOUS | Status: AC
  Filled 2015-09-18: qty 50

## 2015-09-18 NOTE — ED Notes (Signed)
In/out cath performed by Gerda Diss R.N. And Alecia Lemming EMT

## 2015-09-18 NOTE — ED Notes (Signed)
Pt reporting continued weakness.

## 2015-09-18 NOTE — ED Notes (Signed)
MD at bedside. 

## 2015-09-18 NOTE — Progress Notes (Signed)
ANTICOAGULATION CONSULT NOTE - Preliminary  Pharmacy Consult for heparin Indication: DVT  Allergies  Allergen Reactions  . Codeine Itching  . Omeprazole Nausea And Vomiting  . Penicillins Other (See Comments)    "blacked out"    Patient Measurements: Height: 5\' 4"  (162.6 cm) Weight: 128 lb 15.5 oz (58.5 kg) IBW/kg (Calculated) : 54.7 HEPARIN DW (KG): 58.5   Vital Signs: Temp: 97.9 F (36.6 C) (12/08 2307) Temp Source: Oral (12/08 2307) BP: 103/36 mmHg (12/08 2307) Pulse Rate: 88 (12/08 2307)  Labs:  Recent Labs  09/18/15 1555  HGB 9.2*  HCT 25.6*  PLT 410*  CREATININE 2.01*   Estimated Creatinine Clearance: 19.3 mL/min (by C-G formula based on Cr of 2.01).  Medical History: Past Medical History  Diagnosis Date  . Essential hypertension   . Hyperlipidemia   . Asthmatic bronchitis   . Chronic pancreatitis ([place])   . Osteoarthritis of knee   . Atypical ductal hyperplasia of right breast   . Vitamin D deficiency   . GERD (gastroesophageal reflux disease)   . Hyperparathyroidism ([place])   . Zollinger-Ellison syndrome   . Carotid artery disease ([place])   . Cancer ([place])     Medications:  Scheduled:  . [START ON 09/19/2015] aspirin EC  81 mg Oral Daily  . atorvastatin  10 mg Oral q1800  . [START ON 09/19/2015] furosemide  40 mg Intravenous BID  . heparin  3,000 Units Intravenous Once  . metoprolol succinate  25 mg Oral QPM  . piperacillin-tazobactam (ZOSYN)  IV  2.25 g Intravenous 3 times per day  . vancomycin  1,000 mg Intravenous Once    Assessment: 81 yo female presented after a fall. Pt has profound weakness in legs. Was started on Xarelto BID Monday for blood clot in leg. Last dose of Xarelto 12/7 @ 2015.  Last dose Xarelto >24 hours, OK to start heparin with no bolus.  APTT, PT/INR, and BL HL pending. CBC done.  Goal of Therapy:  Heparin level 0.3-0.7 units/ml   Plan:  Start heparin infusion at 950 units/hr Check anti-Xa level in 6 hours and daily  while on heparin Continue to monitor H&H and platelets Preliminary review of pertinent patient information completed.  Forestine Na clinical pharmacist will complete review during morning rounds to assess the patient and finalize treatment regimen.  Naziyah Tieszen, Magdalene Molly, RPH 09/18/2015,11:38 PM

## 2015-09-18 NOTE — Progress Notes (Addendum)
[place] for Vancomycin and Zosyn Indication: sepsis  Allergies  Allergen Reactions  . Codeine Itching  . Omeprazole Nausea And Vomiting  . Penicillins Other (See Comments)    "blacked out"    Patient Measurements: Height: 5\' 4"  (162.6 cm) Weight: 132 lb (59.875 kg) IBW/kg (Calculated) : 54.7   Vital Signs: Temp: 98.1 F (36.7 C) (12/08 2225) Temp Source: Oral (12/08 2225) BP: 108/46 mmHg (12/08 2225) Pulse Rate: 93 (12/08 2225)  Labs:  Recent Labs  09/18/15 1555  WBC 18.8*  HGB 9.2*  PLT 410*  CREATININE 2.01*    Estimated Creatinine Clearance: 19.3 mL/min (by C-G formula based on Cr of 2.01).  No results for input(s): VANCOTROUGH, VANCOPEAK, VANCORANDOM, GENTTROUGH, GENTPEAK, GENTRANDOM, TOBRATROUGH, TOBRAPEAK, TOBRARND, AMIKACINPEAK, AMIKACINTROU, AMIKACIN in the last 72 hours.   Microbiology: No results found for this or any previous visit (from the past 720 hour(s)).  Medical History: Past Medical History  Diagnosis Date  . Essential hypertension   . Hyperlipidemia   . Asthmatic bronchitis   . Chronic pancreatitis ([place])   . Osteoarthritis of knee   . Atypical ductal hyperplasia of right breast   . Vitamin D deficiency   . GERD (gastroesophageal reflux disease)   . Hyperparathyroidism ([place])   . Zollinger-Ellison syndrome   . Carotid artery disease ([place])   . Cancer [facility])     Medications:    Assessment: 81yo F pancreatic cancer pt admitted with lower extremity weakness. Vancomycin and Zosyn initiated to r/o sepsis.  Goal of Therapy:  Vanc trough 15-73mcg/ml  Plan:  1. Preliminary review of pertinent patient information completed.  Protocol will be initiated with a one-time dose(s) of Vancomycin.  Forestine Na clinical pharmacist will complete review during morning rounds to assess patient and finalize treatment regimen. 2. Zosyn 2.25grams IV q8h.  Vernie Ammons, [facility] 09/18/2015,10:37 PM   Addum:  Cont  vancomycin 500 mg IV q24 hours F/u renal function, cultures and clinical course Excell Seltzer, PharmD

## 2015-09-18 NOTE — ED Notes (Addendum)
Pt family reports became unsteady today while in restroom and was assisted to the floor by family. Denies hitting head/loc. denies neck/back pain. Pt c/o pain to bilateral buttocks/knees/legs unrelated to fall. Pt currently being treated for stomach cancer with chemo.  Pt states she was started on Xarelto BID on Monday for a blood clot in her leg,  and has had dizziness and nausea. Pt states she did not take her xarelto this am.

## 2015-09-18 NOTE — ED Provider Notes (Signed)
CSN: [id number]     Arrival date & time 09/18/15  1505 History   First MD Initiated Contact with Patient 09/18/15 1513     Chief Complaint  Patient presents with  . Fall     (Consider location/radiation/quality/duration/timing/severity/associated sxs/prior Treatment) Patient is a 81 y.o. female presenting with fall. The history is provided by the patient (Patient states that since Monday her legs have been very weak and getting worse. Patient states she fell to the ground did not herself).  Fall This is a new problem. The current episode started more than 2 days ago. The problem occurs constantly. The problem has not changed since onset.Pertinent negatives include no chest pain, no abdominal pain and no headaches. Nothing aggravates the symptoms. Nothing relieves the symptoms.    Past Medical History  Diagnosis Date  . Essential hypertension   . Hyperlipidemia   . Asthmatic bronchitis   . Chronic pancreatitis ([place])   . Osteoarthritis of knee   . Atypical ductal hyperplasia of right breast   . Vitamin D deficiency   . GERD (gastroesophageal reflux disease)   . Hyperparathyroidism ([place])   . Zollinger-Ellison syndrome   . Carotid artery disease ([place])   . Cancer [facility])    Past Surgical History  Procedure Laterality Date  . Colonoscopy  2009    [place]  . Tonsillectomy    . Cataract extraction, bilateral Bilateral   . Parathyroidectomy    . Ovarian cyst removal    . Eus N/A 04/17/2015    Jacobs-1.7 cm x 2.5 cm mass in head of pancreas causing dilation of pancreatic duct and common bile duct.  . Ercp N/A 04/23/2015    [id number], heterogeneous,indistinctly bordered mass in head of pancreas  . Sphincterotomy N/A 04/23/2015    Procedure: SPHINCTEROTOMY;  Surgeon: Daneil Dolin, MD;  Location: [facility];  Service: Endoscopy;  Laterality: N/A;  . Biliary stent placement N/A 04/23/2015    Procedure: BILIARY STENT PLACEMENT;  Surgeon: Daneil Dolin, MD;  Location: [facility];  Service:  Endoscopy;  Laterality: N/A;  . Whipple procedure  August 15th 2016   Family History  Problem Relation Age of Onset  . CAD Mother   . Stroke Father   . Stroke Brother   . Breast cancer Sister    Social History  Substance Use Topics  . Smoking status: Never Smoker   . Smokeless tobacco: Never Used  . Alcohol Use: No   OB History    Gravida Para Term Preterm AB TAB SAB Ectopic Multiple Living            0     Review of Systems  Constitutional: Negative for appetite change and fatigue.  HENT: Negative for congestion, ear discharge and sinus pressure.   Eyes: Negative for discharge.  Respiratory: Negative for cough.   Cardiovascular: Negative for chest pain.  Gastrointestinal: Negative for abdominal pain and diarrhea.  Genitourinary: Negative for frequency and hematuria.  Musculoskeletal: Negative for back pain.  Skin: Negative for rash.  Neurological: Negative for seizures and headaches.  Psychiatric/Behavioral: Negative for hallucinations.      Allergies  Codeine; Omeprazole; and Penicillins  Home Medications   Prior to Admission medications   Medication Sig Start Date End Date Taking? Authorizing Provider  aspirin EC 81 MG tablet Take 81 mg by mouth every morning.   Yes Historical Provider, MD  atorvastatin (LIPITOR) 10 MG tablet Take 10 mg by mouth daily after lunch.    Yes Historical Provider, MD  Cholecalciferol (  VITAMIN D) 2000 UNITS CAPS Take 1 capsule by mouth every morning.    Yes Historical Provider, MD  loperamide (IMODIUM A-D) 2 MG tablet Take 2 mg by mouth as needed for diarrhea or loose stools.   Yes Historical Provider, MD  losartan (COZAAR) 100 MG tablet Take 100 mg by mouth daily.  06/26/15  Yes Historical Provider, MD  metoCLOPramide (REGLAN) 10 MG tablet Take 10 mg by mouth 2 (two) times daily as needed for nausea. And at night 06/03/15  Yes Historical Provider, MD  metoprolol succinate (TOPROL-XL) 25 MG 24 hr tablet Take 25 mg by mouth every evening.     Yes Historical Provider, MD  oxyCODONE (OXY IR/ROXICODONE) 5 MG immediate release tablet Take 1 tablet (5 mg total) by mouth every 6 (six) hours as needed. 08/22/15  Yes Baird Cancer, PA-C  acetaminophen (TYLENOL) 500 MG tablet Take 500 mg by mouth every 6 (six) hours as needed for mild pain or moderate pain.    Historical Provider, MD  furosemide (LASIX) 20 MG tablet Take 1 tablet (20 mg total) by mouth daily as needed. Patient taking differently: Take 20 mg by mouth daily as needed for fluid or edema.  08/06/15   Baird Cancer, PA-C  Gemcitabine HCl (GEMZAR IV) Inject into the vein. To start August 15, 2015.    Historical Provider, MD  lidocaine-prilocaine (EMLA) cream Apply 1 application topically as needed. 08/27/15   Manon Hilding Kefalas, PA-C   BP 101/49 mmHg  Pulse 88  Temp(Src) 98.5 F (36.9 C) (Oral)  Resp 16  Ht 5\' 4"  (1.626 m)  Wt 132 lb (59.875 kg)  BMI 22.65 kg/m2  SpO2 100% Physical Exam  Constitutional: She is oriented to person, place, and time. She appears well-developed.  HENT:  Head: Normocephalic.  Eyes: Conjunctivae and EOM are normal. No scleral icterus.  Neck: Neck supple. No thyromegaly present.  Cardiovascular: Normal rate and regular rhythm.  Exam reveals no gallop and no friction rub.   No murmur heard. Pulmonary/Chest: No stridor. She has no wheezes. She has no rales. She exhibits no tenderness.  Abdominal: She exhibits no distension. There is no tenderness. There is no rebound.  Musculoskeletal: Normal range of motion. She exhibits no edema.  Lymphadenopathy:    She has no cervical adenopathy.  Neurological: She is oriented to person, place, and time. She exhibits normal muscle tone. Coordination normal.  Patient has profound weakness in both lower legs she can just barely lift them off the bed  Skin: No rash noted. No erythema.  Psychiatric: She has a normal mood and affect. Her behavior is normal.    ED Course  Procedures (including critical  care time) Labs Review Labs Reviewed  CBC WITH DIFFERENTIAL/PLATELET - Abnormal; Notable for the following:    WBC 18.8 (*)    RBC 3.19 (*)    Hemoglobin 9.2 (*)    HCT 25.6 (*)    RDW 17.6 (*)    Platelets 410 (*)    Neutro Abs 15.9 (*)    Monocytes Absolute 1.7 (*)    All other components within normal limits  COMPREHENSIVE METABOLIC PANEL - Abnormal; Notable for the following:    Potassium 3.1 (*)    Chloride 114 (*)    CO2 16 (*)    Glucose, Bld 102 (*)    BUN 25 (*)    Creatinine, Ser 2.01 (*)    Calcium 8.0 (*)    Total Protein 4.9 (*)    Albumin  1.8 (*)    AST 55 (*)    GFR calc non Af Amer 22 (*)    GFR calc Af Amer 26 (*)    All other components within normal limits  URINALYSIS, ROUTINE W REFLEX MICROSCOPIC (NOT AT [facility]) - Abnormal; Notable for the following:    Specific Gravity, Urine >1.030 (*)    Hgb urine dipstick TRACE (*)    Protein, ur TRACE (*)    All other components within normal limits  URINE MICROSCOPIC-ADD ON - Abnormal; Notable for the following:    Squamous Epithelial / LPF 0-5 (*)    Bacteria, UA FEW (*)    Casts HYALINE CASTS (*)    All other components within normal limits  LACTIC ACID, PLASMA  LACTIC ACID, PLASMA    Imaging Review Dg Chest 2 View  09/18/2015  CLINICAL DATA:  Increasing weakness.  Fall today, gastric cancer. EXAM: CHEST  2 VIEW COMPARISON:  CT chest 04/18/2015 and chest radiograph 03/26/2012. FINDINGS: Trachea is midline. Heart size normal. Right IJ power port tip is in the low SVC. Lungs are clear. No pleural fluid. Right hemidiaphragm is chronically elevated. Osseous structures appear grossly intact. IMPRESSION: No acute findings. Electronically Signed   By: Lorin Picket M.D.   On: 09/18/2015 17:20   Mr Lumbar Spine Wo Contrast  09/18/2015  CLINICAL DATA:  Bilateral leg weakness and back pain. History of pancreatic cancer. EXAM: MRI LUMBAR SPINE WITHOUT CONTRAST TECHNIQUE: Multiplanar, multisequence MR imaging of the  lumbar spine was performed. No intravenous contrast was administered. COMPARISON:  Abdominal CT 04/18/2015.  Abdominal MRI 02/10/2015. FINDINGS: Scan quality is suboptimal. Segmentation: Conventional anatomy assumed, with the last open disc space designated L5-S1. Alignment: Stable from prior studies with mild convex left scoliosis and grade 1 anterolisthesis at L4-5. Bones: No worrisome osseous lesion, acute fracture or pars defect. No evidence of osseous metastatic disease. There are chronic endplate degenerative changes at L4-5 and L5-S1. Conus medullaris: Extends to the L2 level and appears normal. Paraspinal and other soft tissues: The retroperitoneum is not well visualized by this examination. There is mild paraspinal edema near the thoracolumbar junction, possibly related to prior radiation therapy. There also appears to be dependent fluid within the subcutaneous fat dorsal to the erector spinae musculature, demonstrating fairly homogeneous high T2 and low T1 signal. No evidence of discitis. Disc levels: T12-L1: Mild disc bulging. No spinal stenosis or nerve root encroachment. L1-2:  Normal interspace. L2-3:  The disc appears normal.  Mild bilateral facet hypertrophy. L3-4: Annular disc bulging and moderate bilateral facet hypertrophy contribute to mild spinal stenosis with mild narrowing of the left lateral recess and both foramina. L4-5: There is severe multifactorial spinal stenosis secondary to advanced facet disease, the resulting grade 1 anterolisthesis and annular disc bulging. There is asymmetric narrowing of the right lateral recess and right foramen. L5-S1: Chronic degenerative disc disease with annular disc bulging and paraspinal osteophytes. Mild facet and ligamentous hypertrophy. There is mild spinal stenosis with mild narrowing of the lateral recesses and foramina bilaterally. IMPRESSION: 1. Multilevel spondylosis as described with similar alignment to previous CT. There is severe multifactorial  spinal stenosis at L4-5. 2. No acute osseous findings or evidence of metastatic disease. 3. Possible paraspinous edema superiorly, possibly related to previous radiation therapy. Prominent subcutaneous fluid posteriorly in the back, possibly from similar etiology or stasis. Correlate clinically. Electronically Signed   By: Richardean Sale M.D.   On: 09/18/2015 18:35   I have personally reviewed and evaluated  these images and lab results as part of my medical decision-making.   EKG Interpretation   Date/Time:  Thursday September 18 2015 16:56:44 EST Ventricular Rate:  87 PR Interval:  129 QRS Duration: 59 QT Interval:  526 QTC Calculation: 633 R Axis:   52 Text Interpretation:  Sinus rhythm Low voltage, extremity and precordial  leads Borderline repolarization abnormality Prolonged QT interval  Confirmed by Maleyah Evans  MD, Breanna Mcdaniel [id number]) on 09/18/2015 7:47:47 PM      MDM   Final diagnoses:  Weakness   Patient with pancreatic cancer status post Whipple procedure on chemotherapy. New profound weakness in lower extremities will be admitted for continued evaluation    Milton Ferguson, MD 09/18/15 2042

## 2015-09-18 NOTE — ED Notes (Signed)
Hospitalist at bedside 

## 2015-09-19 ENCOUNTER — Ambulatory Visit (HOSPITAL_COMMUNITY): Payer: Medicare Other | Admitting: Oncology

## 2015-09-19 ENCOUNTER — Other Ambulatory Visit (HOSPITAL_COMMUNITY): Payer: Self-pay | Admitting: Hematology & Oncology

## 2015-09-19 ENCOUNTER — Inpatient Hospital Stay (HOSPITAL_COMMUNITY): Payer: Medicare Other

## 2015-09-19 DIAGNOSIS — W19XXXA Unspecified fall, initial encounter: Secondary | ICD-10-CM

## 2015-09-19 DIAGNOSIS — R609 Edema, unspecified: Secondary | ICD-10-CM

## 2015-09-19 DIAGNOSIS — D689 Coagulation defect, unspecified: Secondary | ICD-10-CM | POA: Diagnosis present

## 2015-09-19 DIAGNOSIS — E118 Type 2 diabetes mellitus with unspecified complications: Secondary | ICD-10-CM | POA: Diagnosis present

## 2015-09-19 DIAGNOSIS — N183 Chronic kidney disease, stage 3 unspecified: Secondary | ICD-10-CM

## 2015-09-19 DIAGNOSIS — E876 Hypokalemia: Secondary | ICD-10-CM | POA: Diagnosis present

## 2015-09-19 DIAGNOSIS — N179 Acute kidney failure, unspecified: Secondary | ICD-10-CM | POA: Diagnosis present

## 2015-09-19 DIAGNOSIS — E43 Unspecified severe protein-calorie malnutrition: Secondary | ICD-10-CM | POA: Diagnosis present

## 2015-09-19 DIAGNOSIS — M7989 Other specified soft tissue disorders: Secondary | ICD-10-CM

## 2015-09-19 DIAGNOSIS — D631 Anemia in chronic kidney disease: Secondary | ICD-10-CM

## 2015-09-19 DIAGNOSIS — A419 Sepsis, unspecified organism: Secondary | ICD-10-CM | POA: Insufficient documentation

## 2015-09-19 DIAGNOSIS — C250 Malignant neoplasm of head of pancreas: Secondary | ICD-10-CM

## 2015-09-19 HISTORY — DX: Chronic kidney disease, stage 3 unspecified: N18.30

## 2015-09-19 HISTORY — DX: Anemia in chronic kidney disease: D63.1

## 2015-09-19 LAB — BASIC METABOLIC PANEL
Anion gap: 11 (ref 5–15)
BUN: 26 mg/dL — AB (ref 6–20)
CALCIUM: 8 mg/dL — AB (ref 8.9–10.3)
CO2: 18 mmol/L — ABNORMAL LOW (ref 22–32)
CREATININE: 2 mg/dL — AB (ref 0.44–1.00)
Chloride: 112 mmol/L — ABNORMAL HIGH (ref 101–111)
GFR calc Af Amer: 26 mL/min — ABNORMAL LOW (ref >60)
GFR, EST NON AFRICAN AMERICAN: 22 mL/min — AB (ref >60)
Glucose, Bld: 117 mg/dL — ABNORMAL HIGH (ref 65–99)
Potassium: 2.8 mmol/L — ABNORMAL LOW (ref 3.5–5.1)
SODIUM: 141 mmol/L (ref 135–145)

## 2015-09-19 LAB — APTT
APTT: 39 s — AB (ref 24–37)
aPTT: >200 seconds (ref 24–37)
aPTT: >200 seconds (ref 24–37)

## 2015-09-19 LAB — PROTIME-INR
INR: 3.49 — AB (ref 0.00–1.49)
PROTHROMBIN TIME: 34.3 s — AB (ref 11.6–15.2)

## 2015-09-19 LAB — CBC
HCT: 27.2 % — ABNORMAL LOW (ref 36.0–46.0)
Hemoglobin: 9.6 g/dL — ABNORMAL LOW (ref 12.0–15.0)
MCH: 28.6 pg (ref 26.0–34.0)
MCHC: 35.3 g/dL (ref 30.0–36.0)
MCV: 81 fL (ref 78.0–100.0)
PLATELETS: 419 10*3/uL — AB (ref 150–400)
RBC: 3.36 MIL/uL — ABNORMAL LOW (ref 3.87–5.11)
RDW: 17.8 % — AB (ref 11.5–15.5)
WBC: 20.5 10*3/uL — AB (ref 4.0–10.5)

## 2015-09-19 LAB — HEPARIN LEVEL (UNFRACTIONATED)
HEPARIN UNFRACTIONATED: 1.36 [IU]/mL — AB (ref 0.30–0.70)
Heparin Unfractionated: >2.2 IU/mL — ABNORMAL HIGH (ref 0.30–0.70)
Heparin Unfractionated: >2.2 IU/mL — ABNORMAL HIGH (ref 0.30–0.70)

## 2015-09-19 LAB — TSH: TSH: 1.11 u[IU]/mL (ref 0.350–4.500)

## 2015-09-19 LAB — MAGNESIUM: MAGNESIUM: 1.6 mg/dL — AB (ref 1.7–2.4)

## 2015-09-19 MED ORDER — PIPERACILLIN SOD-TAZOBACTAM SO 2.25 (2-0.25) G IV SOLR
2.2500 g | Freq: Three times a day (TID) | INTRAVENOUS | Status: DC
  Filled 2015-09-19 (×10): qty 2.25

## 2015-09-19 MED ORDER — HEPARIN (PORCINE) IN NACL 100-0.45 UNIT/ML-% IJ SOLN
700.0000 [IU]/h | INTRAMUSCULAR | Status: DC
  Administered 2015-09-19: 700 [IU]/h via INTRAVENOUS
  Filled 2015-09-19: qty 250

## 2015-09-19 MED ORDER — FUROSEMIDE 10 MG/ML IJ SOLN
40.0000 mg | Freq: Once | INTRAMUSCULAR | Status: AC
  Administered 2015-09-19: 40 mg via INTRAVENOUS
  Filled 2015-09-19: qty 4

## 2015-09-19 MED ORDER — POTASSIUM CHLORIDE 2 MEQ/ML IV SOLN
INTRAVENOUS | Status: DC
  Filled 2015-09-19 (×6): qty 1000

## 2015-09-19 MED ORDER — SODIUM CHLORIDE 0.9 % IV SOLN
500.0000 mg | INTRAVENOUS | Status: DC
  Filled 2015-09-19 (×3): qty 500

## 2015-09-19 MED ORDER — MAGNESIUM SULFATE 50 % IJ SOLN
1.0000 g | Freq: Once | INTRAVENOUS | Status: DC
  Filled 2015-09-19: qty 2

## 2015-09-19 MED ORDER — POTASSIUM CHLORIDE IN NACL 20-0.45 MEQ/L-% IV SOLN
INTRAVENOUS | Status: DC
  Administered 2015-09-19: 13:00:00 via INTRAVENOUS
  Filled 2015-09-19 (×6): qty 1000

## 2015-09-19 MED ORDER — POTASSIUM CHLORIDE CRYS ER 20 MEQ PO TBCR
30.0000 meq | EXTENDED_RELEASE_TABLET | Freq: Two times a day (BID) | ORAL | Status: AC
  Administered 2015-09-19 (×2): 30 meq via ORAL
  Filled 2015-09-19 (×2): qty 1

## 2015-09-19 MED ORDER — MAGNESIUM SULFATE IN D5W 10-5 MG/ML-% IV SOLN
1.0000 g | Freq: Once | INTRAVENOUS | Status: AC
  Administered 2015-09-19: 1 g via INTRAVENOUS
  Filled 2015-09-19: qty 100

## 2015-09-19 MED ORDER — HEPARIN (PORCINE) IN NACL 100-0.45 UNIT/ML-% IJ SOLN
600.0000 [IU]/h | INTRAMUSCULAR | Status: DC
  Administered 2015-09-19: 500 [IU]/h via INTRAVENOUS

## 2015-09-19 MED ORDER — PIPERACILLIN-TAZOBACTAM IN DEX 2-0.25 GM/50ML IV SOLN
INTRAVENOUS | Status: AC
  Filled 2015-09-19: qty 50

## 2015-09-19 NOTE — Progress Notes (Signed)
Patient has not had any output since admission, paged on call MD to make him aware, will follow any new orders received and continue to monitor the patient.

## 2015-09-19 NOTE — Care Management Note (Signed)
Case Management Note  Patient Details  Name: Veronica Stevenson MRN: [id number] Date of Birth: 1933/07/09  Subjective/Objective:                  Pt admitted for LE swelling. Pt lives alone, has a strong support system. Pt ind with ADL's, no DME or HH services prior to admission.   Action/Plan: Pt plans to return home with self care. No CM needs at this time.   Expected Discharge Date:  09/22/15               Expected Discharge Plan:  Home/Self Care  In-House Referral:  NA  Discharge planning Services  CM Consult  Post Acute Care Choice:  NA Choice offered to:  NA  DME Arranged:    DME Agency:     HH Arranged:    HH Agency:     Status of Service:  Completed, signed off  Medicare Important Message Given:    Date Medicare IM Given:    Medicare IM give by:    Date Additional Medicare IM Given:    Additional Medicare Important Message give by:     If discussed at [place] of Stay Meetings, dates discussed:    Additional Comments:  Sherald Barge, RN 09/19/2015, 2:35 PM

## 2015-09-19 NOTE — Progress Notes (Signed)
Received critical value from Lab. Pt's Heparin >200. Pharmacy informed and received verbal order to stop Heparin.

## 2015-09-19 NOTE — Consult Note (Signed)
Inpatient Hematology/Oncology Consultation   Name: Veronica Stevenson      MRN: [id number]    Location: A311/A311-01  Date: 09/19/2015 Time:3:53 PM   REFERRING PHYSICIAN:  Dr. Orvan Falconer   REASON FOR CONSULT:   Fall LE edema Weakness    DIAGNOSIS:  Pancreatic cancer s/p resection with positive margin Adjuvant therapy with single agent Gemzar Worsening LE edema Fall  HISTORY OF PRESENT ILLNESS:   Veronica Stevenson is an 81 year old black American female who is well known to the [facility] where she is actively undergoing adjuvant systemic chemotherapy with Gemcitabine single agent for a Stage II pancreatic cancer, S/P whipple procedure by Dr. Eugenia Pancoast at [facility].    Pancreatic cancer ([place])   04/17/2015 Tumor Marker Results for Veronica, Stevenson (MRN [id number]) as of 07/25/2015 09:24  04/17/2015 08:57 CA 19-9: 701 (H)    04/18/2015 Imaging CT abd/pelvis- Severe intrahepatic and extrahepatic biliary ductal and pancreatic ductal dilatation is noted secondary to 3 cm pancreatic head mass consistent with malignancy.   04/23/2015 Pathology Results BILE DUCT BRUSHING (SPECIMEN 1 OF 1 COLLECTED 04/23/2015) NONDIAGNOSTIC MATERIAL.   04/23/2015 Procedure ERCP- Dr. Ardis Hughs   06/05/2015 Surgery Dr. Eugenia Pancoast at [place] procedure demonstrating a T3N0 pancreatic mass with positive ucinate margin   07/18/2015 Tumor Marker Results for Veronica Stevenson, Veronica Stevenson (MRN [id number]) as of 07/25/2015 09:24  07/18/2015 08:52 CA 19-9: 49 (H)    08/15/2015 -  Chemotherapy Gemcitabine single agent days 1, 8 every 28 days.   08/26/2015 Procedure Port placed by IR due to poor veinous access.   08/27/2015 Miscellaneous 2 unit PRBC transfusion   08/29/2015 Treatment Plan Change Deletion of day 15 of treatment due to low ANC on cycle 1 Day 15.   09/15/2015 Echocardiogram Left ventricle: The cavity size was normal. Wall thickness was normal. Systolic function was normal. The estimated ejection fraction was in the range of 60% to  65%. Wall motion was normal; there were no regional wall motion abnormalities. Doppler   09/18/2015 [facility] Admission Leg pain, swelling, and fall   09/18/2015 Imaging MRI L-spine- Multilevel spondylosis as described with similar alignment to previous CT. There is severe multifactorial spinal stenosis at L4-5. 2. No acute osseous findings or evidence of metastatic disease. 3. Possible paraspinous edema superiorly, po   09/19/2015 Imaging [place] - Sonographic survey of the IVC and iliac veins demonstrates patent distal IVC and bilateral iliac veins.   09/19/2015 Imaging [place] B/L LE- No significant occlusive DVT in either extremity.   She woke up on Thursday AM and was unable to move her legs because they were heavy.  She called her family member who went to her house to help.  EMS was called as a result.  EMS was at the house and Jayley, with her family member, went to the bathroom and fell.  She was subsequently brought to [facility] ED via EMS. Workup in the ED showed a leukocytosis of 18K, (patient has not received neulasta or other growth factor support) No evidence of infection has been found. She is afebrile. She was noted to be hypokalemic and have borderline low magnesium, both are being replaced.   She denies any nausea or vomiting. This has resolved. Initially after surgery this was a significant issue for her.   I personally reviewed and went over radiographic studies with the patient.  The results are noted within this dictation.    I personally reviewed and went over laboratory results with  the patient.  The results are noted within this dictation.  Chart reviewed.  PAST MEDICAL HISTORY:   Past Medical History  Diagnosis Date  . Essential hypertension   . Hyperlipidemia   . Asthmatic bronchitis   . Chronic pancreatitis ([place])   . Osteoarthritis of knee   . Atypical ductal hyperplasia of right breast   . Vitamin D deficiency   . GERD (gastroesophageal reflux disease)   . Hyperparathyroidism  ([place])   . Zollinger-Ellison syndrome   . Carotid artery disease ([place])   . Cancer (H[facility])     ALLERGIES: Allergies  Allergen Reactions  . Codeine Itching  . Omeprazole Nausea And Vomiting  . Penicillins Other (See Comments)    "blacked out"      MEDICATIONS: I have reviewed the patient's current medications.     PAST SURGICAL HISTORY Past Surgical History  Procedure Laterality Date  . Colonoscopy  2009    [place]  . Tonsillectomy    . Cataract extraction, bilateral Bilateral   . Parathyroidectomy    . Ovarian cyst removal    . Eus N/A 04/17/2015    Jacobs-1.7 cm x 2.5 cm mass in head of pancreas causing dilation of pancreatic duct and common bile duct.  . Ercp N/A 04/23/2015    SHU:[id number], heterogeneous,indistinctly bordered mass in head of pancreas  . Sphincterotomy N/A 04/23/2015    Procedure: SPHINCTEROTOMY;  Surgeon: Daneil Dolin, MD;  Location: [facility] ORS;  Service: Endoscopy;  Laterality: N/A;  . Biliary stent placement N/A 04/23/2015    Procedure: BILIARY STENT PLACEMENT;  Surgeon: Daneil Dolin, MD;  Location: [facility] ORS;  Service: Endoscopy;  Laterality: N/A;  . Whipple procedure  August 15th 2016    FAMILY HISTORY: Family History  Problem Relation Age of Onset  . CAD Mother   . Stroke Father   . Stroke Brother   . Breast cancer Sister     SOCIAL HISTORY:  reports that she has never smoked. She has never used smokeless tobacco. She reports that she does not drink alcohol or use illicit drugs. 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 She is never married. She has no children. However, she has nieces and nephews in the area. She has 1 brother and 1 sister still living. 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  Review of Systems  Constitutional: Positive for weight loss and malaise/fatigue. Negative for fever, chills and diaphoresis.  HENT: Negative.   Eyes: Negative.   Respiratory: Negative.     Cardiovascular: Negative.   Gastrointestinal: Negative.   Genitourinary: Negative.   Musculoskeletal: Positive for myalgias.       Leg pain  Skin: Negative.   Neurological: Positive for weakness. Negative for dizziness, tingling, tremors, sensory change, speech change, focal weakness and loss of consciousness.  Endo/Heme/Allergies: Negative.   Psychiatric/Behavioral: Negative.   14 point review of systems was performed and is negative except as detailed under history of present illness and above   PHYSICAL EXAMINATION  ECOG PERFORMANCE STATUS: 2 - Symptomatic, <50% confined to bed  Filed Vitals:   09/19/15 0610 09/19/15 1500  BP: 128/44 86/60  Pulse: 82 89  Temp: 97.5 F (36.4 C) 97.5 F (36.4 C)  Resp: 20 18    Physical Exam  Constitutional: She is oriented to person, place, and time. No distress.  Appears comfortable in hospital bed. Family visiting and present   HENT:  Head: Normocephalic and  atraumatic.  Mouth/Throat: No oropharyngeal exudate.  Eyes: Pupils are equal, round, and reactive to light. Right eye exhibits no discharge. Left eye exhibits no discharge. No scleral icterus.  Neck: Normal range of motion. Neck supple. No tracheal deviation present. No thyromegaly present.  Cardiovascular: Normal rate, regular rhythm and normal heart sounds.   No murmur heard. Pulmonary/Chest: Effort normal and breath sounds normal. No respiratory distress. She has no wheezes. She has no rales. She exhibits no tenderness.  Abdominal: Soft. Bowel sounds are normal. She exhibits no distension and no mass. There is no tenderness. There is no rebound and no guarding.  Musculoskeletal: She exhibits edema.  Bilateral LE edema to hips, pitting  Lymphadenopathy:    She has no cervical adenopathy.  Neurological: She is alert and oriented to person, place, and time. No cranial nerve deficit.  Skin: Skin is warm and dry. No rash noted. She is not diaphoretic. No erythema.  Psychiatric:  Mood, memory, affect and judgment normal.    LABORATORY DATA:  Results for orders placed or performed during the hospital encounter of 09/18/15 (from the past 48 hour(s))  CBC with Differential/Platelet     Status: Abnormal   Collection Time: 09/18/15  3:55 PM  Result Value Ref Range   WBC 18.8 (H) 4.0 - 10.5 K/uL   RBC 3.19 (L) 3.87 - 5.11 MIL/uL   Hemoglobin 9.2 (L) 12.0 - 15.0 g/dL   HCT 25.6 (L) 36.0 - 46.0 %   MCV 80.3 78.0 - 100.0 fL   MCH 28.8 26.0 - 34.0 pg   MCHC 35.9 30.0 - 36.0 g/dL   RDW 17.6 (H) 11.5 - 15.5 %   Platelets 410 (H) 150 - 400 K/uL   Neutrophils Relative % 84 %   Neutro Abs 15.9 (H) 1.7 - 7.7 K/uL   Lymphocytes Relative 7 %   Lymphs Abs 1.3 0.7 - 4.0 K/uL   Monocytes Relative 9 %   Monocytes Absolute 1.7 (H) 0.1 - 1.0 K/uL   Eosinophils Relative 0 %   Eosinophils Absolute 0.0 0.0 - 0.7 K/uL   Basophils Relative 0 %   Basophils Absolute 0.0 0.0 - 0.1 K/uL  Comprehensive metabolic panel     Status: Abnormal   Collection Time: 09/18/15  3:55 PM  Result Value Ref Range   Sodium 142 135 - 145 mmol/L   Potassium 3.1 (L) 3.5 - 5.1 mmol/L   Chloride 114 (H) 101 - 111 mmol/L   CO2 16 (L) 22 - 32 mmol/L   Glucose, Bld 102 (H) 65 - 99 mg/dL   BUN 25 (H) 6 - 20 mg/dL   Creatinine, Ser 2.01 (H) 0.44 - 1.00 mg/dL   Calcium 8.0 (L) 8.9 - 10.3 mg/dL   Total Protein 4.9 (L) 6.5 - 8.1 g/dL   Albumin 1.8 (L) 3.5 - 5.0 g/dL   AST 55 (H) 15 - 41 U/L   ALT 34 14 - 54 U/L   Alkaline Phosphatase 91 38 - 126 U/L   Total Bilirubin 1.0 0.3 - 1.2 mg/dL   GFR calc non Af Amer 22 (L) >60 mL/min   GFR calc Af Amer 26 (L) >60 mL/min    Comment: (NOTE) The eGFR has been calculated using the CKD EPI equation. This calculation has not been validated in all clinical situations. eGFR's persistently <60 mL/min signify possible Chronic Kidney Disease.    Anion gap 12 5 - 15  Urinalysis, Routine w reflex microscopic (not at [facility])     Status: Abnormal  Collection Time: 09/18/15   5:20 PM  Result Value Ref Range   Color, Urine YELLOW YELLOW   APPearance CLEAR CLEAR   Specific Gravity, Urine >1.030 (H) 1.005 - 1.030   pH 5.5 5.0 - 8.0   Glucose, UA NEGATIVE NEGATIVE mg/dL   Hgb urine dipstick TRACE (A) NEGATIVE   Bilirubin Urine NEGATIVE NEGATIVE   Ketones, ur NEGATIVE NEGATIVE mg/dL   Protein, ur TRACE (A) NEGATIVE mg/dL   Nitrite NEGATIVE NEGATIVE   Leukocytes, UA NEGATIVE NEGATIVE  Urine microscopic-add on     Status: Abnormal   Collection Time: 09/18/15  5:20 PM  Result Value Ref Range   Squamous Epithelial / LPF 0-5 (A) NONE SEEN   WBC, UA 6-30 0 - 5 WBC/hpf   RBC / HPF 0-5 0 - 5 RBC/hpf   Bacteria, UA FEW (A) NONE SEEN   Casts HYALINE CASTS (A) NEGATIVE  Lactic acid, plasma     Status: None   Collection Time: 09/18/15  6:50 PM  Result Value Ref Range   Lactic Acid, Venous 1.6 0.5 - 2.0 mmol/L  Lactic acid, plasma     Status: Abnormal   Collection Time: 09/18/15  9:25 PM  Result Value Ref Range   Lactic Acid, Venous 2.9 (HH) 0.5 - 2.0 mmol/L    Comment: CRITICAL RESULT CALLED TO, READ BACK BY AND VERIFIED WITH: JOHNSON,J ON 09/18/15 AT 2155 BY LOY,C   TSH     Status: None   Collection Time: 09/18/15  9:25 PM  Result Value Ref Range   TSH 1.110 0.350 - 4.500 uIU/mL  Culture, blood (routine x 2)     Status: None (Preliminary result)   Collection Time: 09/18/15 10:53 PM  Result Value Ref Range   Specimen Description BLOOD LEFT HAND    Special Requests BOTTLES DRAWN AEROBIC AND ANAEROBIC 6CC EACH    Culture PENDING    Report Status PENDING   Culture, blood (routine x 2)     Status: None (Preliminary result)   Collection Time: 09/18/15 10:56 PM  Result Value Ref Range   Specimen Description BLOOD LEFT HAND    Special Requests BOTTLES DRAWN AEROBIC AND ANAEROBIC 4CC EACH    Culture PENDING    Report Status PENDING   APTT     Status: Abnormal   Collection Time: 09/18/15 11:45 PM  Result Value Ref Range   aPTT 39 (H) 24 - 37 seconds     Comment:        IF BASELINE aPTT IS ELEVATED, SUGGEST PATIENT RISK ASSESSMENT BE USED TO DETERMINE APPROPRIATE ANTICOAGULANT THERAPY.   Protime-INR     Status: Abnormal   Collection Time: 09/18/15 11:45 PM  Result Value Ref Range   Prothrombin Time 34.3 (H) 11.6 - 15.2 seconds   INR 3.49 (H) 0.00 - 1.49  Basic metabolic panel     Status: Abnormal   Collection Time: 09/19/15  2:45 AM  Result Value Ref Range   Sodium 141 135 - 145 mmol/L   Potassium 2.8 (L) 3.5 - 5.1 mmol/L   Chloride 112 (H) 101 - 111 mmol/L   CO2 18 (L) 22 - 32 mmol/L   Glucose, Bld 117 (H) 65 - 99 mg/dL   BUN 26 (H) 6 - 20 mg/dL   Creatinine, Ser 3.01 (H) 0.44 - 1.00 mg/dL   Calcium 8.0 (L) 8.9 - 10.3 mg/dL   GFR calc non Af Amer 22 (L) >60 mL/min   GFR calc Af Amer 26 (L) >60 mL/min  Comment: (NOTE) The eGFR has been calculated using the CKD EPI equation. This calculation has not been validated in all clinical situations. eGFR's persistently <60 mL/min signify possible Chronic Kidney Disease.    Anion gap 11 5 - 15  CBC     Status: Abnormal   Collection Time: 09/19/15  2:45 AM  Result Value Ref Range   WBC 20.5 (H) 4.0 - 10.5 K/uL   RBC 3.36 (L) 3.87 - 5.11 MIL/uL   Hemoglobin 9.6 (L) 12.0 - 15.0 g/dL   HCT 27.2 (L) 36.0 - 46.0 %   MCV 81.0 78.0 - 100.0 fL   MCH 28.6 26.0 - 34.0 pg   MCHC 35.3 30.0 - 36.0 g/dL   RDW 17.8 (H) 11.5 - 15.5 %   Platelets 419 (H) 150 - 400 K/uL  Heparin level (unfractionated)     Status: Abnormal   Collection Time: 09/19/15  2:45 AM  Result Value Ref Range   Heparin Unfractionated >2.20 (H) 0.30 - 0.70 IU/mL    Comment: RESULTS CONFIRMED BY MANUAL DILUTION        IF HEPARIN RESULTS ARE BELOW EXPECTED VALUES, AND PATIENT DOSAGE HAS BEEN CONFIRMED, SUGGEST FOLLOW UP TESTING OF ANTITHROMBIN III LEVELS.   Heparin level (unfractionated)     Status: Abnormal   Collection Time: 09/19/15  9:38 AM  Result Value Ref Range   Heparin Unfractionated >2.20 (H) 0.30 - 0.70  IU/mL    Comment: RESULTS CONFIRMED BY MANUAL DILUTION        IF HEPARIN RESULTS ARE BELOW EXPECTED VALUES, AND PATIENT DOSAGE HAS BEEN CONFIRMED, SUGGEST FOLLOW UP TESTING OF ANTITHROMBIN III LEVELS.   APTT     Status: Abnormal   Collection Time: 09/19/15  9:39 AM  Result Value Ref Range   aPTT >200 (HH) 24 - 37 seconds    Comment:        IF BASELINE aPTT IS ELEVATED, SUGGEST PATIENT RISK ASSESSMENT BE USED TO DETERMINE APPROPRIATE ANTICOAGULANT THERAPY. CRITICAL RESULT CALLED TO, READ BACK BY AND VERIFIED WITH: BULLLINS,L AT 2993 ON 09/19/2015 BY WOODS,M   Magnesium     Status: Abnormal   Collection Time: 09/19/15  9:39 AM  Result Value Ref Range   Magnesium 1.6 (L) 1.7 - 2.4 mg/dL      RADIOGRAPHY: Dg Chest 2 View  09/18/2015  CLINICAL DATA:  Increasing weakness.  Fall today, gastric cancer. EXAM: CHEST  2 VIEW COMPARISON:  CT chest 04/18/2015 and chest radiograph 03/26/2012. FINDINGS: Trachea is midline. Heart size normal. Right IJ power port tip is in the low SVC. Lungs are clear. No pleural fluid. Right hemidiaphragm is chronically elevated. Osseous structures appear grossly intact. IMPRESSION: No acute findings. Electronically Signed   By: Lorin Picket M.D.   On: 09/18/2015 17:20   Mr Lumbar Spine Wo Contrast  09/18/2015  CLINICAL DATA:  Bilateral leg weakness and back pain. History of pancreatic cancer. EXAM: MRI LUMBAR SPINE WITHOUT CONTRAST TECHNIQUE: Multiplanar, multisequence MR imaging of the lumbar spine was performed. No intravenous contrast was administered. COMPARISON:  Abdominal CT 04/18/2015.  Abdominal MRI 02/10/2015. FINDINGS: Scan quality is suboptimal. Segmentation: Conventional anatomy assumed, with the last open disc space designated L5-S1. Alignment: Stable from prior studies with mild convex left scoliosis and grade 1 anterolisthesis at L4-5. Bones: No worrisome osseous lesion, acute fracture or pars defect. No evidence of osseous metastatic disease. There  are chronic endplate degenerative changes at L4-5 and L5-S1. Conus medullaris: Extends to the L2 level and appears normal. Paraspinal  and other soft tissues: The retroperitoneum is not well visualized by this examination. There is mild paraspinal edema near the thoracolumbar junction, possibly related to prior radiation therapy. There also appears to be dependent fluid within the subcutaneous fat dorsal to the erector spinae musculature, demonstrating fairly homogeneous high T2 and low T1 signal. No evidence of discitis. Disc levels: T12-L1: Mild disc bulging. No spinal stenosis or nerve root encroachment. L1-2:  Normal interspace. L2-3:  The disc appears normal.  Mild bilateral facet hypertrophy. L3-4: Annular disc bulging and moderate bilateral facet hypertrophy contribute to mild spinal stenosis with mild narrowing of the left lateral recess and both foramina. L4-5: There is severe multifactorial spinal stenosis secondary to advanced facet disease, the resulting grade 1 anterolisthesis and annular disc bulging. There is asymmetric narrowing of the right lateral recess and right foramen. L5-S1: Chronic degenerative disc disease with annular disc bulging and paraspinal osteophytes. Mild facet and ligamentous hypertrophy. There is mild spinal stenosis with mild narrowing of the lateral recesses and foramina bilaterally. IMPRESSION: 1. Multilevel spondylosis as described with similar alignment to previous CT. There is severe multifactorial spinal stenosis at L4-5. 2. No acute osseous findings or evidence of metastatic disease. 3. Possible paraspinous edema superiorly, possibly related to previous radiation therapy. Prominent subcutaneous fluid posteriorly in the back, possibly from similar etiology or stasis. Correlate clinically. Electronically Signed   By: Richardean Sale M.D.   On: 09/18/2015 18:35   US Venous Img Lower Bilateral  09/19/2015  CLINICAL DATA:  Bilateral lower extremity swelling, pain, recent fall  EXAM: BILATERAL LOWER EXTREMITY VENOUS DOPPLER ULTRASOUND TECHNIQUE: Gray-scale sonography with graded compression, as well as color Doppler and duplex ultrasound were performed to evaluate the lower extremity deep venous systems from the level of the common femoral vein and including the common femoral, femoral, profunda femoral, popliteal and calf veins including the posterior tibial, peroneal and gastrocnemius veins when visible. The superficial great saphenous vein was also interrogated. Spectral Doppler was utilized to evaluate flow at rest and with distal augmentation maneuvers in the common femoral, femoral and popliteal veins. COMPARISON:  None. FINDINGS: Limited exam because of peripheral edema. RIGHT LOWER EXTREMITY Common Femoral Vein: No evidence of thrombus. Normal compressibility, respiratory phasicity and response to augmentation. Saphenofemoral Junction: No evidence of thrombus. Normal compressibility and flow on color Doppler imaging. Profunda Femoral Vein: No evidence of thrombus. Normal compressibility and flow on color Doppler imaging. Femoral Vein: No evidence of thrombus. Normal compressibility, respiratory phasicity and response to augmentation. Popliteal Vein: No evidence of thrombus. Normal compressibility, respiratory phasicity and response to augmentation. Calf Veins: No evidence of thrombus. Normal compressibility and flow on color Doppler imaging. Superficial Great Saphenous Vein: No evidence of thrombus. Normal compressibility and flow on color Doppler imaging. Venous Reflux:  None. Other Findings:  Subcutaneous peripheral edema present. LEFT LOWER EXTREMITY Common Femoral Vein: No evidence of thrombus. Normal compressibility, respiratory phasicity and response to augmentation. Saphenofemoral Junction: No evidence of thrombus. Normal compressibility and flow on color Doppler imaging. Profunda Femoral Vein: No evidence of thrombus. Normal compressibility and flow on color Doppler  imaging. Femoral Vein: No evidence of thrombus. Normal compressibility, respiratory phasicity and response to augmentation. Popliteal Vein: No evidence of thrombus. Normal compressibility, respiratory phasicity and response to augmentation. Calf Veins: No evidence of thrombus. Normal compressibility and flow on color Doppler imaging. Superficial Great Saphenous Vein: No evidence of thrombus. Normal compressibility and flow on color Doppler imaging. Venous Reflux:  None. Other Findings:  Subcutaneous peripheral edema  present. IMPRESSION: No significant occlusive DVT in either extremity. Electronically Signed   By: Jerilynn Mages.  Shick M.D.   On: 09/19/2015 11:50   US Aorta/ivc/iliacs [place]  09/19/2015  CLINICAL DATA:  81 year old female with lower extremity swelling EXAM: ULTRASOUND OF ABDOMINAL IVC TECHNIQUE: Ultrasound examination of the abdominal IVC and iliac veins was performed to evaluate for venous thrombotic disease. COMPARISON:  None. FINDINGS: Abdominal IVC The proximal and mid IVC are obscured by bowel gas with poor sonographic window. Visualized distal IVC demonstrates expected waveform, with no occlusion. Transmission of the waveform distally suggest no evidence of more proximal occlusion. Iliac veins: Bilateral iliac veins are patent. IMPRESSION: Sonographic survey of the IVC and iliac veins demonstrates patent distal IVC and bilateral iliac veins. Signed, Dulcy Fanny. Earleen Newport, DO [facility] Radiology Electronically Signed   By: Corrie Mckusick D.O.   On: 09/19/2015 14:33      ASSESSMENT: BLE edema, severe Fall Pancreatic cancer s/p resection with positive margin Adjuvant therapy with single agent Gemzar  PLAN:  At this point I have recommended taking a break from Gemzar. It has been associated with edema and I feel it is likely contributing to her worsening problem. Dexamethasone given with her treatment may also be contributing. Would recommend PT when  able and home with PT as well. I discussed holding off on additional therapy and she is agreeable. We will regroup in regards to treatment after the holiday and if she is able can consider 5FU based treatment. Unfortunately, after resection, the patient was found to have a positive margin. (Note that patient has not had any radiation)  Certainly agree with current plan. Please call me through weekend with questions or concerns. [phone].  All questions were answered. This note was electronically signed. Molli Hazard MD

## 2015-09-19 NOTE — Progress Notes (Addendum)
Patient is a [age] year old woman with a history of pancreatic adenocarcinoma, status post Whipple procedure, asthmatic bronchitis, essential hypertension, and recent diagnosis of right upper extremity cephalic vein thrombus, who was admitted by Dr. Truman Hayward this morning for a fall and worsening bilateral lower extremity edema. She was briefly seen and examined. Her chart, vital signs, laboratory studies were reviewed. Agree with current management with additions below.  -Bilateral lower extremity venous ultrasound was negative for DVT. Apparently she was started on xarelto on 09/12/15 but stopped it because of perceived side effects. She was started on IV heparin empirically. Her INR was 3.49, but it is unclear if this was drawn during the heparin infusion.  -Now her heparin level is greater than 200. It is now on hold. Will ask oncology to opine about further anticoagulation.  -Patient's white blood cell count is elevated, but there appears to be no evidence of infection. The leukocytosis could be secondary to steroid therapy or colony stimulating agent. Will try to review the chart or discuss with oncology about recent medications. We'll discontinue Zosyn and vancomycin for now.  -Despite the patient's lower extremity edema, she appears to be mildly volume depleted with an elevated BUN and creatinine and metabolic acidosis. Therefore, we'll start gentle IV fluids with bicarbonate added. We'll hold IV Lasix-she was given one IV dose early this morning. -Review of chart shows that her EF was 60-65% with grade 1 diastolic dysfunction on [id number].  -Will start potassium chloride repletion for hypokalemia. -Magnesium level is borderline low, so we'll give IV magnesium.  -We'll start sliding scale NovoLog for treatment of diabetes. The patient recently stopped metformin due to loose stools.  -Ultrasound of the aorta, IV, iliacs ordered and results pending.

## 2015-09-19 NOTE — Progress Notes (Signed)
Baseline heparin level drawn at 0245, 1 hour after infusion began. Heparin level >2.2. Elevation presumably due to previously being on Xarelto causing increased anti-Xa lab effect, and not reflective of supratherapeutic anticoagulation. Will check HL and aPTT 8 hours after start of infusion, at 0900. Because aPTT and HL do not correlate, will dose heparin based on aPTTs at this time.   Thank you,  Jaslyne Beeck [phone]

## 2015-09-19 NOTE — Clinical Documentation Improvement (Signed)
Internal Medicine  Please clarify if the following diagnosis, Rt cephalic vein DVT was:   Present at the time of admission (POA)  NOT present at the time of admission and it developed during the inpatient stay  Unable to clinically determine whether the condition was present on admission.  Unknown   Supporting Information: IV Heparin started neg dopplers to lower extremeties    Please exercise your independent, professional judgment when responding. A specific answer is not anticipated or expected.   Thank You,  [place] [phone]

## 2015-09-19 NOTE — Care Management Important Message (Signed)
Important Message  Patient Details  Name: Veronica Stevenson MRN: [id number] Date of Birth: 1933-09-28   Medicare Important Message Given:  Yes    Sherald Barge, RN 09/19/2015, 2:50 PM

## 2015-09-19 NOTE — Progress Notes (Addendum)
Veronica Stevenson for heparin Indication: DVT  Allergies  Allergen Reactions  . Codeine Itching  . Omeprazole Nausea And Vomiting  . Penicillins Other (See Comments)    "blacked out"    Patient Measurements: Height: 5\' 4"  (162.6 cm) Weight: 128 lb 15.5 oz (58.5 kg) IBW/kg (Calculated) : 54.7 HEPARIN DW (KG): 58.5   Vital Signs: Temp: 97.5 F (36.4 C) (12/09 0610) Temp Source: Axillary (12/09 0610) BP: 128/44 mmHg (12/09 0610) Pulse Rate: 82 (12/09 0610)  Labs:  Recent Labs  09/18/15 1555 09/18/15 2345 09/19/15 0245 09/19/15 0938 09/19/15 0939  HGB 9.2*  --  9.6*  --   --   HCT 25.6*  --  27.2*  --   --   PLT 410*  --  419*  --   --   APTT  --  39*  --   --  >200*  LABPROT  --  34.3*  --   --   --   INR  --  3.49*  --   --   --   HEPARINUNFRC  --   --  >2.20* >2.20*  --   CREATININE 2.01*  --  2.00*  --   --    Estimated Creatinine Clearance: 19.4 mL/min (by C-G formula based on Cr of 2).  Medical History: Past Medical History  Diagnosis Date  . Essential hypertension   . Hyperlipidemia   . Asthmatic bronchitis   . Chronic pancreatitis ([place])   . Osteoarthritis of knee   . Atypical ductal hyperplasia of right breast   . Vitamin D deficiency   . GERD (gastroesophageal reflux disease)   . Hyperparathyroidism ([place])   . Zollinger-Ellison syndrome   . Carotid artery disease ([place])   . Cancer (HCC)     Medications:  Scheduled:  . aspirin EC  81 mg Oral Daily  . atorvastatin  10 mg Oral q1800  . magnesium sulfate 1 - 4 g bolus IVPB  1 g Intravenous Once  . metoprolol succinate  25 mg Oral QPM  . potassium chloride  30 mEq Oral BID    Assessment: 81 yo female presented after a fall. Pt has profound weakness in legs. Was started on Xarelto BID Monday for blood clot in leg. Last dose of Xarelto 12/7 @ 2015.  Baseline labs were elevated due to xarelto and poor renal function. Heparin level now >2.2 and aPTT >200 sec.  Drawn  correctly per RN.  Will need to use aPTT initially to guide dosing due to effedcts of xarelto on antiXa levels.   Goal of Therapy:  Heparin level 0.3-0.7 units/ml  Aptt 66-102   Plan:  Hold heparin for 1.5 hours Resume at 700 units/hr Check anti-Xa level and aPTT in 6 hours and daily while on heparin F/u Oncology recommendations  Thanks for allowing pharmacy to be a part of this patient's care.  Excell Seltzer, PharmD Clinical Pharmacist 09/19/2015,12:49 PM   Addum:  Heparin level and aPTT remain supratherapeutic.  Will hold heparin for 1 hours and restart at 500 units/hr.  F/u am labs Excell Seltzer, PharmD

## 2015-09-19 NOTE — H&P (Signed)
[facility] History and Physical  Veronica Stevenson [id number] DOB: [birth year]    PCP:   [name], NP   Chief Complaint: Leg pain and swelling, falling.   HPI: Veronica Stevenson is an 81 y.o. 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  Unclear as to when she stopped, as she told me a few days ago, and to the RN yesterday. She was getting up, but fell, and she said it was due to the pain.  She denied headache, back pain, fever, chills, coughs, abdominal pain, GI or GU symptoms.  Evalaution in the ER included a marked leukocytosis of 18K, and elevated lactic acid of 2.9, normal TSH, negative UA for any infection.  An MRI of the LS spine was obtained, and she has possible paraspinous edema, possibly related to prior radiation therapy, and severe spinal stenosis at L4,L5 with no evidence of metastatic disease.  Hospitalist was asked to admit her for further work up.   Rewiew of Systems:  Constitutional: Negative for malaise, fever and chills. No significant weight loss or weight gain Eyes: Negative for eye pain, redness and discharge, diplopia, visual changes, or flashes of light. ENMT: Negative for ear pain, hoarseness, nasal congestion, sinus pressure and sore throat. No headaches; tinnitus, drooling, or problem swallowing. Cardiovascular: Negative for chest pain, palpitations, diaphoresis, dyspnea and peripheral edema. ; No orthopnea, PND Respiratory: Negative for cough, hemoptysis, wheezing and stridor. No pleuritic chestpain. Gastrointestinal: Negative for nausea, vomiting, diarrhea, constipation, abdominal pain, melena, blood in stool, hematemesis, jaundice and rectal bleeding.    Genitourinary: Negative  for frequency, dysuria, incontinence,flank pain and hematuria; Musculoskeletal: Negative for back pain and neck pain. Negative for swelling and trauma.;  Skin: . Negative for pruritus, rash, abrasions, bruising and skin lesion.; ulcerations Neuro: Negative for headache, lightheadedness and neck stiffness. Negative for weakness, altered level of consciousness , altered mental status,  burning feet, involuntary movement, seizure and syncope.  Psych: negative for anxiety, depression, insomnia, tearfulness, panic attacks, hallucinations, paranoia, suicidal or homicidal ideation    Past Medical History  Diagnosis Date  . Essential hypertension   . Hyperlipidemia   . Asthmatic bronchitis   . Chronic pancreatitis ([place])   . Osteoarthritis of knee   . Atypical ductal hyperplasia of right breast   . Vitamin D deficiency   . GERD (gastroesophageal reflux disease)   . Hyperparathyroidism ([place])   . Zollinger-Ellison syndrome   . Carotid artery disease ([place])   . Cancer [facility])     Past Surgical History  Procedure Laterality Date  . Colonoscopy  2009    [place]  . Tonsillectomy    . Cataract extraction, bilateral Bilateral   . Parathyroidectomy    . Ovarian cyst removal    . Eus N/A 04/17/2015    Jacobs-1.7 cm x 2.5 cm mass in head of pancreas causing dilation of pancreatic duct and common bile duct.  . Ercp N/A 04/23/2015    [id number], heterogeneous,indistinctly bordered mass in head of pancreas  . Sphincterotomy N/A 04/23/2015    Procedure: SPHINCTEROTOMY;  Surgeon: Daneil Dolin, MD;  Location: [facility];  Service: Endoscopy;  Laterality: N/A;  . Biliary stent placement N/A 04/23/2015    Procedure: BILIARY STENT PLACEMENT;  Surgeon: Daneil Dolin,  MD;  Location: [facility];  Service: Endoscopy;  Laterality: N/A;  . Whipple procedure  August 15th 2016    Medications:  HOME MEDS: Prior to Admission medications   Medication Sig Start Date End Date Taking? Authorizing Provider  aspirin EC  81 MG tablet Take 81 mg by mouth every morning.   Yes Historical Provider, MD  atorvastatin (LIPITOR) 10 MG tablet Take 10 mg by mouth daily after lunch.    Yes Historical Provider, MD  Cholecalciferol (VITAMIN D) 2000 UNITS CAPS Take 1 capsule by mouth every morning.    Yes Historical Provider, MD  loperamide (IMODIUM A-D) 2 MG tablet Take 2 mg by mouth as needed for diarrhea or loose stools.   Yes Historical Provider, MD  losartan (COZAAR) 100 MG tablet Take 100 mg by mouth daily.  06/26/15  Yes Historical Provider, MD  metoCLOPramide (REGLAN) 10 MG tablet Take 10 mg by mouth 2 (two) times daily as needed for nausea. And at night 06/03/15  Yes Historical Provider, MD  metoprolol succinate (TOPROL-XL) 25 MG 24 hr tablet Take 25 mg by mouth every evening.    Yes Historical Provider, MD  oxyCODONE (OXY IR/ROXICODONE) 5 MG immediate release tablet Take 1 tablet (5 mg total) by mouth every 6 (six) hours as needed. 08/22/15  Yes Baird Cancer, PA-C  acetaminophen (TYLENOL) 500 MG tablet Take 500 mg by mouth every 6 (six) hours as needed for mild pain or moderate pain.    Historical Provider, MD  furosemide (LASIX) 20 MG tablet Take 1 tablet (20 mg total) by mouth daily as needed. Patient taking differently: Take 20 mg by mouth daily as needed for fluid or edema.  08/06/15   Baird Cancer, PA-C  Gemcitabine HCl (GEMZAR IV) Inject into the vein. To start August 15, 2015.    Historical Provider, MD  lidocaine-prilocaine (EMLA) cream Apply 1 application topically as needed. 08/27/15   Baird Cancer, PA-C     Allergies:  Allergies  Allergen Reactions  . Codeine Itching  . Omeprazole Nausea And Vomiting  . Penicillins Other (See Comments)    "blacked out"    Social History:   reports that she has never smoked. She has never used smokeless tobacco. She reports that she does not drink alcohol or use illicit drugs.  Family History: Family History  Problem Relation Age of Onset  . CAD Mother    . Stroke Father   . Stroke Brother   . Breast cancer Sister      Physical Exam: Filed Vitals:   09/18/15 2100 09/18/15 2130 09/18/15 2225 09/18/15 2307  BP: 108/50 110/47 108/46 103/36  Pulse:   93 88  Temp:   98.1 F (36.7 C) 97.9 F (36.6 C)  TempSrc:   Oral Oral  Resp:   16 16  Height:    5\' 4"  (1.626 m)  Weight:    58.5 kg (128 lb 15.5 oz)  SpO2:   100% 97%   Blood pressure 103/36, pulse 88, temperature 97.9 F (36.6 C), temperature source Oral, resp. rate 16, height 5\' 4"  (1.626 m), weight 58.5 kg (128 lb 15.5 oz), SpO2 97 %.  GEN:  Pleasant  patient lying in the stretcher in no acute distress; cooperative with exam. PSYCH:  alert and oriented x4; does not appear anxious or depressed; affect is appropriate. HEENT: Mucous membranes pink and anicteric; PERRLA; EOM intact; no cervical lymphadenopathy nor thyromegaly or carotid bruit; no JVD; There were no stridor. Neck is very  supple. Breasts:: Not examined CHEST WALL: No tenderness CHEST: Normal respiration, clear to auscultation bilaterally.  HEART: Regular rate and rhythm.  There are no murmur, rub, or gallops.   BACK: No kyphosis or scoliosis; no CVA tenderness ABDOMEN: soft and non-tender; no masses, no organomegaly, normal abdominal bowel sounds; no pannus; no intertriginous candida. There is no rebound and no distention. Rectal Exam: Not done EXTREMITIES: No bone or joint deformity; age-appropriate arthropathy of the hands and knees; 4 plus edema.  Tender to touch, no ulcerations.  There is no calf tenderness. Genitalia: not examined PULSES: 2+ and symmetric SKIN: Normal hydration no rash or ulceration CNS: Cranial nerves 2-12 grossly intact no focal lateralizing neurologic deficit.  Speech is fluent; uvula elevated with phonation, facial symmetry and tongue midline. DTR are normal bilaterally, cerebella exam is intact, barbinski is negative and strengths are equaled bilaterally.  No sensory loss. She has weakness of  both lower extremities by exam, unclear if they were due to pain.    Labs on Admission:  Basic Metabolic Panel:  Recent Labs Lab 09/12/15 1240 09/18/15 1555  NA 141 142  K 3.7 3.1*  CL 112* 114*  CO2 22 16*  GLUCOSE 92 102*  BUN 14 25*  CREATININE 1.46* 2.01*  CALCIUM 8.1* 8.0*   Liver Function Tests:  Recent Labs Lab 09/12/15 1240 09/18/15 1555  AST 30 55*  ALT 22 34  ALKPHOS 109 91  BILITOT 0.7 1.0  PROT 5.4* 4.9*  ALBUMIN 2.0* 1.8*   CBC:  Recent Labs Lab 09/12/15 1240 09/18/15 1555  WBC 8.2 18.8*  NEUTROABS 4.4 15.9*  HGB 10.6* 9.2*  HCT 30.4* 25.6*  MCV 82.6 80.3  PLT 740* 410*   Radiological Exams on Admission: Dg Chest 2 View  09/18/2015  CLINICAL DATA:  Increasing weakness.  Fall today, gastric cancer. EXAM: CHEST  2 VIEW COMPARISON:  CT chest 04/18/2015 and chest radiograph 03/26/2012. FINDINGS: Trachea is midline. Heart size normal. Right IJ power port tip is in the low SVC. Lungs are clear. No pleural fluid. Right hemidiaphragm is chronically elevated. Osseous structures appear grossly intact. IMPRESSION: No acute findings. Electronically Signed   By: Lorin Picket M.D.   On: 09/18/2015 17:20   Mr Lumbar Spine Wo Contrast  09/18/2015  CLINICAL DATA:  Bilateral leg weakness and back pain. History of pancreatic cancer. EXAM: MRI LUMBAR SPINE WITHOUT CONTRAST TECHNIQUE: Multiplanar, multisequence MR imaging of the lumbar spine was performed. No intravenous contrast was administered. COMPARISON:  Abdominal CT 04/18/2015.  Abdominal MRI 02/10/2015. FINDINGS: Scan quality is suboptimal. Segmentation: Conventional anatomy assumed, with the last open disc space designated L5-S1. Alignment: Stable from prior studies with mild convex left scoliosis and grade 1 anterolisthesis at L4-5. Bones: No worrisome osseous lesion, acute fracture or pars defect. No evidence of osseous metastatic disease. There are chronic endplate degenerative changes at L4-5 and L5-S1. Conus  medullaris: Extends to the L2 level and appears normal. Paraspinal and other soft tissues: The retroperitoneum is not well visualized by this examination. There is mild paraspinal edema near the thoracolumbar junction, possibly related to prior radiation therapy. There also appears to be dependent fluid within the subcutaneous fat dorsal to the erector spinae musculature, demonstrating fairly homogeneous high T2 and low T1 signal. No evidence of discitis. Disc levels: T12-L1: Mild disc bulging. No spinal stenosis or nerve root encroachment. L1-2:  Normal interspace. L2-3:  The disc appears normal.  Mild bilateral facet hypertrophy. L3-4: Annular disc bulging and moderate bilateral facet hypertrophy  contribute to mild spinal stenosis with mild narrowing of the left lateral recess and both foramina. L4-5: There is severe multifactorial spinal stenosis secondary to advanced facet disease, the resulting grade 1 anterolisthesis and annular disc bulging. There is asymmetric narrowing of the right lateral recess and right foramen. L5-S1: Chronic degenerative disc disease with annular disc bulging and paraspinal osteophytes. Mild facet and ligamentous hypertrophy. There is mild spinal stenosis with mild narrowing of the lateral recesses and foramina bilaterally. IMPRESSION: 1. Multilevel spondylosis as described with similar alignment to previous CT. There is severe multifactorial spinal stenosis at L4-5. 2. No acute osseous findings or evidence of metastatic disease. 3. Possible paraspinous edema superiorly, possibly related to previous radiation therapy. Prominent subcutaneous fluid posteriorly in the back, possibly from similar etiology or stasis. Correlate clinically. Electronically Signed   By: Richardean Sale M.D.   On: 09/18/2015 18:35    EKG: Independently reviewed.   Assessment/Plan Present on Admission:  . Leg swelling . Anemia . Essential hypertension . HLD (hyperlipidemia) . Malignant neoplasm of head  of pancreas ([place])  PLAN:  Lower extremity swelling and tenderness:  I don't know exactly what the etiology of her bilateral leg swelling and pain, but I am concerned about thrombosis of the IVC or DVT of both her lower extremities.  She had stopped the Xarelto, and is thrombophilic, as evidenced with her known thrombosis of the righ BC vein. Will start IV heparin per pharmacy dosing.  I reviewed the MRI with Dr Mayer Camel, and nothing was acute with respect to her spinal stenosis.  Will obtain abdominal pelvic US, along with bilateral lower extremities [place], and if no etiology found, consider MRA of the abdomen without contrast.  I have requested oncology consultation as well.  She is stable, full code, and will be admitted to [facility] service. Thank you .  Other plans as per orders.  Code Status:FULL CODE.    Orvan Falconer, MD. [facility] Pager [phone] 7pm to 7am.  09/19/2015, 12:26 AM

## 2015-09-19 NOTE — Progress Notes (Signed)
CRITICAL VALUE ALERT  Critical value received: ptt, unfractionated heparin  Date of notification: 09-19-15  Time of notification: 2130  Critical value read back: yes  Nurse who received alert: b Andora Krull  MD notified (1st page):    Time of first page:    MD notified (2nd page):  Time of second page:  Responding MD:  Time MD responded:   Pharmacy notifed of ptt (which was unchanged from am) and heparin level.  Received orders from l. seay pharmacist. Will continue to monitor patient.

## 2015-09-20 ENCOUNTER — Encounter (HOSPITAL_COMMUNITY): Payer: Self-pay | Admitting: Internal Medicine

## 2015-09-20 DIAGNOSIS — M4806 Spinal stenosis, lumbar region: Secondary | ICD-10-CM

## 2015-09-20 DIAGNOSIS — E118 Type 2 diabetes mellitus with unspecified complications: Secondary | ICD-10-CM

## 2015-09-20 DIAGNOSIS — E876 Hypokalemia: Secondary | ICD-10-CM

## 2015-09-20 DIAGNOSIS — M48061 Spinal stenosis, lumbar region without neurogenic claudication: Secondary | ICD-10-CM | POA: Diagnosis present

## 2015-09-20 DIAGNOSIS — R600 Localized edema: Principal | ICD-10-CM

## 2015-09-20 DIAGNOSIS — N179 Acute kidney failure, unspecified: Secondary | ICD-10-CM

## 2015-09-20 DIAGNOSIS — I10 Essential (primary) hypertension: Secondary | ICD-10-CM

## 2015-09-20 HISTORY — DX: Spinal stenosis, lumbar region without neurogenic claudication: M48.061

## 2015-09-20 LAB — BASIC METABOLIC PANEL
ANION GAP: 9 (ref 5–15)
BUN: 29 mg/dL — AB (ref 6–20)
CALCIUM: 7.6 mg/dL — AB (ref 8.9–10.3)
CO2: 16 mmol/L — AB (ref 22–32)
Chloride: 114 mmol/L — ABNORMAL HIGH (ref 101–111)
Creatinine, Ser: 2.05 mg/dL — ABNORMAL HIGH (ref 0.44–1.00)
GFR calc Af Amer: 25 mL/min — ABNORMAL LOW (ref >60)
GFR calc non Af Amer: 22 mL/min — ABNORMAL LOW (ref >60)
GLUCOSE: 124 mg/dL — AB (ref 65–99)
Potassium: 3.8 mmol/L (ref 3.5–5.1)
Sodium: 139 mmol/L (ref 135–145)

## 2015-09-20 LAB — GLUCOSE, CAPILLARY
GLUCOSE-CAPILLARY: 112 mg/dL — AB (ref 65–99)
Glucose-Capillary: 94 mg/dL (ref 65–99)
Glucose-Capillary: 108 mg/dL — ABNORMAL HIGH (ref 65–99)
Glucose-Capillary: 133 mg/dL — ABNORMAL HIGH (ref 65–99)

## 2015-09-20 LAB — CBC
HEMATOCRIT: 24.4 % — AB (ref 36.0–46.0)
Hemoglobin: 8.7 g/dL — ABNORMAL LOW (ref 12.0–15.0)
MCH: 28.8 pg (ref 26.0–34.0)
MCHC: 35.7 g/dL (ref 30.0–36.0)
MCV: 80.8 fL (ref 78.0–100.0)
Platelets: 367 10*3/uL (ref 150–400)
RBC: 3.02 MIL/uL — ABNORMAL LOW (ref 3.87–5.11)
RDW: 18.3 % — AB (ref 11.5–15.5)
WBC: 21.1 10*3/uL — AB (ref 4.0–10.5)

## 2015-09-20 LAB — HEPARIN LEVEL (UNFRACTIONATED): Heparin Unfractionated: 2.08 IU/mL — ABNORMAL HIGH (ref 0.30–0.70)

## 2015-09-20 LAB — PROTIME-INR
INR: 1.88 — AB (ref 0.00–1.49)
PROTHROMBIN TIME: 21.5 s — AB (ref 11.6–15.2)

## 2015-09-20 LAB — APTT: aPTT: 49 seconds — ABNORMAL HIGH (ref 24–37)

## 2015-09-20 MED ORDER — POTASSIUM CHLORIDE CRYS ER 10 MEQ PO TBCR: 10.0000 meq | EXTENDED_RELEASE_TABLET | Freq: Every day | ORAL | Status: DC

## 2015-09-20 MED ORDER — SODIUM CHLORIDE 0.9 % IV BOLUS (SEPSIS)
600.0000 mL | Freq: Once | INTRAVENOUS | Status: AC
  Administered 2015-09-20: 600 mL via INTRAVENOUS

## 2015-09-20 MED ORDER — INSULIN ASPART 100 UNIT/ML ~~LOC~~ SOLN
0.0000 [IU] | Freq: Three times a day (TID) | SUBCUTANEOUS | Status: DC
  Administered 2015-09-22 – 2015-09-23 (×2): 1 [IU] via SUBCUTANEOUS

## 2015-09-20 MED ORDER — HYDROMORPHONE HCL 1 MG/ML IJ SOLN
0.5000 mg | INTRAMUSCULAR | Status: DC | PRN
  Administered 2015-09-21 – 2015-09-23 (×4): 0.5 mg via INTRAVENOUS
  Filled 2015-09-20 (×4): qty 1

## 2015-09-20 MED ORDER — ENOXAPARIN SODIUM 60 MG/0.6ML ~~LOC~~ SOLN
60.0000 mg | SUBCUTANEOUS | Status: DC
  Administered 2015-09-20 – 2015-09-23 (×4): 60 mg via SUBCUTANEOUS
  Filled 2015-09-20 (×4): qty 0.6

## 2015-09-20 MED ORDER — SODIUM CHLORIDE 0.45 % IV SOLN
INTRAVENOUS | Status: DC
  Administered 2015-09-20 – 2015-09-22 (×3): via INTRAVENOUS
  Filled 2015-09-20 (×6): qty 1000

## 2015-09-20 MED ORDER — SODIUM CHLORIDE 0.9 % IV BOLUS (SEPSIS): 700.0000 mL | Freq: Once | INTRAVENOUS | Status: DC

## 2015-09-20 MED ORDER — INSULIN ASPART 100 UNIT/ML ~~LOC~~ SOLN: 0.0000 [IU] | Freq: Every day | SUBCUTANEOUS | Status: DC

## 2015-09-20 MED ORDER — POTASSIUM CHLORIDE CRYS ER 10 MEQ PO TBCR
10.0000 meq | EXTENDED_RELEASE_TABLET | Freq: Every day | ORAL | Status: AC
  Administered 2015-09-20: 10 meq via ORAL
  Filled 2015-09-20: qty 1

## 2015-09-20 MED ORDER — FUROSEMIDE 20 MG PO TABS
20.0000 mg | ORAL_TABLET | Freq: Every day | ORAL | Status: DC
  Administered 2015-09-21 – 2015-09-22 (×2): 20 mg via ORAL
  Filled 2015-09-20 (×2): qty 1

## 2015-09-20 NOTE — Progress Notes (Addendum)
[facility] PROGRESS NOTE  Veronica Stevenson [id number] DOB: 25-Jan-1933 DOA: 09/18/2015 PCP: [name], NP    Code Status: Full code Family Communication: Discussed with patient; family not available Disposition Plan: Discharge when clinically appropriate, likely in the next couple of days.   Consultants:  Oncology  Procedures:  None  Antibiotics:  None  HPI/Subjective: Patient has no complaints of chest pain, shortness of breath, abdominal pain, nausea, vomiting. She does have some discomfort in her legs; no pain in her right arm.  Objective: Filed Vitals:   09/19/15 2243 09/20/15 0706  BP: 110/57 100/56  Pulse: 88 83  Temp: 97.6 F (36.4 C) 97.6 F (36.4 C)  Resp: 18 18   oxygen saturation 97% on room air.  Intake/Output Summary (Last 24 hours) at 09/20/15 1234 Last data filed at 09/20/15 0910  Gross per 24 hour  Intake    240 ml  Output      0 ml  Net    240 ml   Filed Weights   09/18/15 1508 09/18/15 2307  Weight: 59.875 kg (132 lb) 58.5 kg (128 lb 15.5 oz)    Exam:   General:  Pleasant 81 year old woman in no acute distress.  Cardiovascular: S1, S2, with a soft systolic murmur.  Respiratory: Clear anteriorly with decreased breath sounds in the bases.  Abdomen: Positive bowel sounds, soft, nontender, nondistended.  Musculoskeletal/extremity: 1-2+ bilateral lower extremity pitting edema was some tenderness. No erythema. Right upper extremity with 1+ nonpitting edema. No edema on the left upper extremity.  Neurologic: She is alert and oriented 3. Her speech is clear. Cranial nerves are grossly intact.  Data Reviewed: Basic Metabolic Panel:  Recent Labs Lab 09/18/15 1555 09/19/15 0245 09/19/15 0939 09/20/15 0600  NA 142 141  --  139  K 3.1* 2.8*  --  3.8  CL 114* 112*  --  114*  CO2 16* 18*  --  16*  GLUCOSE 102* 117*  --  124*  BUN 25* 26*  --  29*  CREATININE 2.01* 2.00*  --  2.05*  CALCIUM 8.0* 8.0*  --  7.6*  MG   --   --  1.6*  --    Liver Function Tests:  Recent Labs Lab 09/18/15 1555  AST 55*  ALT 34  ALKPHOS 91  BILITOT 1.0  PROT 4.9*  ALBUMIN 1.8*   No results for input(s): LIPASE, AMYLASE in the last 168 hours. No results for input(s): AMMONIA in the last 168 hours. CBC:  Recent Labs Lab 09/18/15 1555 09/19/15 0245 09/20/15 0600  WBC 18.8* 20.5* 21.1*  NEUTROABS 15.9*  --   --   HGB 9.2* 9.6* 8.7*  HCT 25.6* 27.2* 24.4*  MCV 80.3 81.0 80.8  PLT 410* 419* 367   Cardiac Enzymes: No results for input(s): CKTOTAL, CKMB, CKMBINDEX, TROPONINI in the last 168 hours. BNP (last 3 results) No results for input(s): BNP in the last 8760 hours.  ProBNP (last 3 results) No results for input(s): PROBNP in the last 8760 hours.  CBG:  Recent Labs Lab 09/20/15 0812 09/20/15 1134  GLUCAP 94 112*    Recent Results (from the past 240 hour(s))  Culture, blood (routine x 2)     Status: None (Preliminary result)   Collection Time: 09/18/15 10:53 PM  Result Value Ref Range Status   Specimen Description BLOOD LEFT HAND  Final   Special Requests BOTTLES DRAWN AEROBIC AND ANAEROBIC 6CC EACH  Final   Culture NO GROWTH 2 DAYS  Final   Report Status PENDING  Incomplete  Culture, blood (routine x 2)     Status: None (Preliminary result)   Collection Time: 09/18/15 10:56 PM  Result Value Ref Range Status   Specimen Description BLOOD LEFT HAND  Final   Special Requests BOTTLES DRAWN AEROBIC AND ANAEROBIC 4CC EACH  Final   Culture NO GROWTH 2 DAYS  Final   Report Status PENDING  Incomplete     Studies: Dg Chest 2 View  09/18/2015  CLINICAL DATA:  Increasing weakness.  Fall today, gastric cancer. EXAM: CHEST  2 VIEW COMPARISON:  CT chest 04/18/2015 and chest radiograph 03/26/2012. FINDINGS: Trachea is midline. Heart size normal. Right IJ power port tip is in the low SVC. Lungs are clear. No pleural fluid. Right hemidiaphragm is chronically elevated. Osseous structures appear grossly intact.  IMPRESSION: No acute findings. Electronically Signed   By: Lorin Picket M.D.   On: 09/18/2015 17:20   Mr Lumbar Spine Wo Contrast  09/18/2015  CLINICAL DATA:  Bilateral leg weakness and back pain. History of pancreatic cancer. EXAM: MRI LUMBAR SPINE WITHOUT CONTRAST TECHNIQUE: Multiplanar, multisequence MR imaging of the lumbar spine was performed. No intravenous contrast was administered. COMPARISON:  Abdominal CT 04/18/2015.  Abdominal MRI 02/10/2015. FINDINGS: Scan quality is suboptimal. Segmentation: Conventional anatomy assumed, with the last open disc space designated L5-S1. Alignment: Stable from prior studies with mild convex left scoliosis and grade 1 anterolisthesis at L4-5. Bones: No worrisome osseous lesion, acute fracture or pars defect. No evidence of osseous metastatic disease. There are chronic endplate degenerative changes at L4-5 and L5-S1. Conus medullaris: Extends to the L2 level and appears normal. Paraspinal and other soft tissues: The retroperitoneum is not well visualized by this examination. There is mild paraspinal edema near the thoracolumbar junction, possibly related to prior radiation therapy. There also appears to be dependent fluid within the subcutaneous fat dorsal to the erector spinae musculature, demonstrating fairly homogeneous high T2 and low T1 signal. No evidence of discitis. Disc levels: T12-L1: Mild disc bulging. No spinal stenosis or nerve root encroachment. L1-2:  Normal interspace. L2-3:  The disc appears normal.  Mild bilateral facet hypertrophy. L3-4: Annular disc bulging and moderate bilateral facet hypertrophy contribute to mild spinal stenosis with mild narrowing of the left lateral recess and both foramina. L4-5: There is severe multifactorial spinal stenosis secondary to advanced facet disease, the resulting grade 1 anterolisthesis and annular disc bulging. There is asymmetric narrowing of the right lateral recess and right foramen. L5-S1: Chronic  degenerative disc disease with annular disc bulging and paraspinal osteophytes. Mild facet and ligamentous hypertrophy. There is mild spinal stenosis with mild narrowing of the lateral recesses and foramina bilaterally. IMPRESSION: 1. Multilevel spondylosis as described with similar alignment to previous CT. There is severe multifactorial spinal stenosis at L4-5. 2. No acute osseous findings or evidence of metastatic disease. 3. Possible paraspinous edema superiorly, possibly related to previous radiation therapy. Prominent subcutaneous fluid posteriorly in the back, possibly from similar etiology or stasis. Correlate clinically. Electronically Signed   By: Richardean Sale M.D.   On: 09/18/2015 18:35   US Venous Img Lower Bilateral  09/19/2015  CLINICAL DATA:  Bilateral lower extremity swelling, pain, recent fall EXAM: BILATERAL LOWER EXTREMITY VENOUS DOPPLER ULTRASOUND TECHNIQUE: Gray-scale sonography with graded compression, as well as color Doppler and duplex ultrasound were performed to evaluate the lower extremity deep venous systems from the level of the common femoral vein and including the common femoral, femoral, profunda femoral, popliteal  and calf veins including the posterior tibial, peroneal and gastrocnemius veins when visible. The superficial great saphenous vein was also interrogated. Spectral Doppler was utilized to evaluate flow at rest and with distal augmentation maneuvers in the common femoral, femoral and popliteal veins. COMPARISON:  None. FINDINGS: Limited exam because of peripheral edema. RIGHT LOWER EXTREMITY Common Femoral Vein: No evidence of thrombus. Normal compressibility, respiratory phasicity and response to augmentation. Saphenofemoral Junction: No evidence of thrombus. Normal compressibility and flow on color Doppler imaging. Profunda Femoral Vein: No evidence of thrombus. Normal compressibility and flow on color Doppler imaging. Femoral Vein: No evidence of thrombus. Normal  compressibility, respiratory phasicity and response to augmentation. Popliteal Vein: No evidence of thrombus. Normal compressibility, respiratory phasicity and response to augmentation. Calf Veins: No evidence of thrombus. Normal compressibility and flow on color Doppler imaging. Superficial Great Saphenous Vein: No evidence of thrombus. Normal compressibility and flow on color Doppler imaging. Venous Reflux:  None. Other Findings:  Subcutaneous peripheral edema present. LEFT LOWER EXTREMITY Common Femoral Vein: No evidence of thrombus. Normal compressibility, respiratory phasicity and response to augmentation. Saphenofemoral Junction: No evidence of thrombus. Normal compressibility and flow on color Doppler imaging. Profunda Femoral Vein: No evidence of thrombus. Normal compressibility and flow on color Doppler imaging. Femoral Vein: No evidence of thrombus. Normal compressibility, respiratory phasicity and response to augmentation. Popliteal Vein: No evidence of thrombus. Normal compressibility, respiratory phasicity and response to augmentation. Calf Veins: No evidence of thrombus. Normal compressibility and flow on color Doppler imaging. Superficial Great Saphenous Vein: No evidence of thrombus. Normal compressibility and flow on color Doppler imaging. Venous Reflux:  None. Other Findings:  Subcutaneous peripheral edema present. IMPRESSION: No significant occlusive DVT in either extremity. Electronically Signed   By: Jerilynn Mages.  Shick M.D.   On: 09/19/2015 11:50   US Aorta/ivc/iliacs [place]  09/19/2015  CLINICAL DATA:  81 year old female with lower extremity swelling EXAM: ULTRASOUND OF ABDOMINAL IVC TECHNIQUE: Ultrasound examination of the abdominal IVC and iliac veins was performed to evaluate for venous thrombotic disease. COMPARISON:  None. FINDINGS: Abdominal IVC The proximal and mid IVC are obscured by bowel gas with poor sonographic window. Visualized distal IVC demonstrates expected waveform, with no  occlusion. Transmission of the waveform distally suggest no evidence of more proximal occlusion. Iliac veins: Bilateral iliac veins are patent. IMPRESSION: Sonographic survey of the IVC and iliac veins demonstrates patent distal IVC and bilateral iliac veins. Signed, Dulcy Fanny. Earleen Newport, DO [facility] Electronically Signed   By: Corrie Mckusick D.O.   On: 09/19/2015 14:33    Scheduled Meds: . aspirin EC  81 mg Oral Daily  . atorvastatin  10 mg Oral q1800  . enoxaparin (LOVENOX) injection  60 mg Subcutaneous Q24H  . insulin aspart  0-5 Units Subcutaneous QHS  . insulin aspart  0-9 Units Subcutaneous TID WC  . metoprolol succinate  25 mg Oral QPM   Continuous Infusions: . sodium chloride 0.45 % 1,000 mL with potassium chloride 20 mEq, sodium bicarbonate 50 mEq infusion 70 mL/hr at 09/20/15 0858   Assessment and plan:  Principal Problem:   Bilateral leg edema Active Problems:   Essential hypertension   Malignant neoplasm of head of pancreas (HCC)   Hypokalemia   AKI (acute kidney injury) ([place])   CKD (chronic kidney disease), stage II   Coagulopathy (HCC)   Anemia of chronic disease   Protein-calorie malnutrition, severe (HCC)   Diabetes mellitus with complication ([place])   Spinal stenosis of  lumbar region   1. Bilateral lower extremity edema. For evaluation, a number studies were ordered. Bilateral lower extremity ultrasound imaging revealed no DVT. Sonographic survey of the IVC and iliac veins revealed patency with no evidence of occlusion. MRI of the L-spine revealed multilevel severe multifactorial spinal stenosis and possible paraspinous edema possibly related to previous radiation therapy. -Patient was started on IV Lasix, but do to perceived volume depletion, it was discontinued. Oral Lasix was started with close monitoring of her renal function. -SCDs were added for compression. -Review of her chart revealed that her EF was  60-65% with grade 1 diastolic dysfunction [id number]. -Oncologist, Dr. Whitney Muse reported that the edema could be from Gemzar and recent dexamethasone.  Pancreatic cancer, status post resection. Patient is under the care of oncologist, Dr. Whitney Muse. Patient had been receiving adjuvant therapy with single agent Gemzar. Because of the edema, Dr. Whitney Muse will be giving her a break from Gemzar. -Patient has no appreciable abdominal pain.  Right upper extremity cephalic vein DVT. The patient was diagnosed prior to hospitalization on 09/12/2015, by oncology. Xarelto was started. However, several days ago, the patient discontinued it because of subjective side effects with nausea and weakness. -IV heparin was started. The level increased to greater than 200, so it was held temporally by pharmacy. -Her INR was elevated at 3.49 on admission, which may have been secondary to IV heparin started. -Have discontinued heparin in favor of Lovenox which could be an ideal outpatient anticoagulant for the patient who did not tolerate oral anticoagulation.  Leukocytosis. There appears to be no active infection on exam or on clinical studies. She did have a significant leukocytosis, on admission but this could've been secondary to recent steroid treatment by oncology. -Blood cultures are ordered in the ED and have been negative to date. -Broad-spectrum IV antibiotics were started, but were discontinued when there was no active source of infection. -We'll continue to monitor.  Hypertension. The patient is treated chronically with losartan and Toprol-XL. Losartan is being held due to acute kidney injury. Toprol was restarted with parameters.  Acute kidney injury with possible mild chronic kidney disease. Patient's creatinine was 2.01 on admission. On 09/12/15, it was 1.46 and 1.07 in 11 2016. She is treated with losartan chronically. It is currently on hold. -The etiology of her acute renal is unclear, but the likely  etiology is prerenal azotemia and mild volume depletion. In review of adverse reactions from Gemzar, renal failure is listed as one. -We'll continue gently hydrating the patient. Gemzar has been withheld by oncology. We'll continue to monitor.  Metabolic acidosis.  The patient had mild lactic acidosis ranging from 1.6-2.9. Her CO2 was 16 on admission. -We'll add bicarbonate to the IV fluids. We'll continue to monitor.  Hypokalemia and hypomagnesemia. Patient's serum potassium was 2.8 on admission, which may have been secondary to Lasix. Her magnesium level was poor line low at 1.6. -She was treated with IV potassium and oral potassium. She was given magnesium sulfate IV. -Her potassium has improved.  Type 2 diabetes mellitus. The patient was recently started on metformin by her PCP. She stopped taking it because of loose stools and nausea. Sliding scale NovoLog was started during hospitalization. Her CBGs have been reasonable.   Time spent: 40 minutes    [facility] Pager [phone]. If 7PM-7AM, please contact night-coverage at [email or web address], password [facility] 09/20/2015, 12:34 PM  LOS: 2 days

## 2015-09-20 NOTE — Progress Notes (Addendum)
[employer] for heparin Indication: DVT  Allergies  Allergen Reactions  . Codeine Itching  . Omeprazole Nausea And Vomiting  . Penicillins Other (See Comments)    "blacked out"    Patient Measurements: Height: 5\' 4"  (162.6 cm) Weight: 128 lb 15.5 oz (58.5 kg) IBW/kg (Calculated) : 54.7 HEPARIN DW (KG): 58.5   Vital Signs: Temp: 97.6 F (36.4 C) (12/10 0706) Temp Source: Oral (12/10 0706) BP: 100/56 mmHg (12/10 0706) Pulse Rate: 83 (12/10 0706)  Labs:  Recent Labs  09/18/15 1555  09/18/15 2345  09/19/15 0245 09/19/15 0938 09/19/15 0939 09/19/15 1951 09/20/15 0600  HGB 9.2*  --   --   --  9.6*  --   --   --  8.7*  HCT 25.6*  --   --   --  27.2*  --   --   --  24.4*  PLT 410*  --   --   --  419*  --   --   --  367  APTT  --   < > 39*  --   --   --  >200* >200* 49*  LABPROT  --   --  34.3*  --   --   --   --   --  21.5*  INR  --   --  3.49*  --   --   --   --   --  1.88*  HEPARINUNFRC  --   --   --   < > >2.20* >2.20*  --  1.36* 2.08*  CREATININE 2.01*  --   --   --  2.00*  --   --   --  2.05*  < > = values in this interval not displayed. Estimated Creatinine Clearance: 18.9 mL/min (by C-G formula based on Cr of 2.05).  Medical History: Past Medical History  Diagnosis Date  . Essential hypertension   . Hyperlipidemia   . Asthmatic bronchitis   . Chronic pancreatitis ([place])   . Osteoarthritis of knee   . Atypical ductal hyperplasia of right breast   . Vitamin D deficiency   . GERD (gastroesophageal reflux disease)   . Hyperparathyroidism ([place])   . Zollinger-Ellison syndrome   . Carotid artery disease ([place])   . Cancer ([place])   . Spinal stenosis of lumbar region 09/20/2015    Medications:  Scheduled:  . aspirin EC  81 mg Oral Daily  . atorvastatin  10 mg Oral q1800  . insulin aspart  0-5 Units Subcutaneous QHS  . insulin aspart  0-9 Units Subcutaneous TID WC  . metoprolol succinate  25 mg Oral QPM     Assessment: 81 yo female presented after a fall. Pt has profound weakness in legs. Was started on Xarelto BID Monday for blood clot in leg. Last dose of Xarelto 12/7 @ 2015.  Baseline labs were elevated due to xarelto and poor renal function. Heparin level 2.08 and aPTT 49 sec. As these do not correlate we will need to use aPTT to guide dosing due to effedcts of xarelto on antiXa levels.   Goal of Therapy:  Heparin level 0.3-0.7 units/ml  Aptt 66-102   Plan:  Increase heparin drip to 600 units/hr Check anti-Xa level and aPTT in 6 hours and daily while on heparin Monitor for bleeding complications  Thanks for allowing pharmacy to be a part of this patient's care.  Excell Seltzer, PharmD Clinical Pharmacist 09/20/2015,8:14 AM   Addum:  Change to lovenox  60 mg sq daily.  Check CBC q 3 days Excell Seltzer, PharmD

## 2015-09-20 NOTE — Progress Notes (Signed)
Patient requested foley to be removed.  Foley removed, patient tolerated procedure well.  Will continue to monitor patient.

## 2015-09-21 DIAGNOSIS — D638 Anemia in other chronic diseases classified elsewhere: Secondary | ICD-10-CM

## 2015-09-21 DIAGNOSIS — N182 Chronic kidney disease, stage 2 (mild): Secondary | ICD-10-CM

## 2015-09-21 LAB — BASIC METABOLIC PANEL
Anion gap: 3 — ABNORMAL LOW (ref 5–15)
BUN: 27 mg/dL — AB (ref 6–20)
CHLORIDE: 115 mmol/L — AB (ref 101–111)
CO2: 21 mmol/L — AB (ref 22–32)
CREATININE: 1.66 mg/dL — AB (ref 0.44–1.00)
Calcium: 7.4 mg/dL — ABNORMAL LOW (ref 8.9–10.3)
GFR calc Af Amer: 33 mL/min — ABNORMAL LOW (ref >60)
GFR calc non Af Amer: 28 mL/min — ABNORMAL LOW (ref >60)
GLUCOSE: 105 mg/dL — AB (ref 65–99)
POTASSIUM: 3.8 mmol/L (ref 3.5–5.1)
SODIUM: 139 mmol/L (ref 135–145)

## 2015-09-21 LAB — CBC
HEMATOCRIT: 23.7 % — AB (ref 36.0–46.0)
HEMOGLOBIN: 8.5 g/dL — AB (ref 12.0–15.0)
MCH: 29 pg (ref 26.0–34.0)
MCHC: 35.9 g/dL (ref 30.0–36.0)
MCV: 80.9 fL (ref 78.0–100.0)
Platelets: 328 10*3/uL (ref 150–400)
RBC: 2.93 MIL/uL — AB (ref 3.87–5.11)
RDW: 18.3 % — ABNORMAL HIGH (ref 11.5–15.5)
WBC: 16.4 10*3/uL — ABNORMAL HIGH (ref 4.0–10.5)

## 2015-09-21 LAB — GLUCOSE, CAPILLARY
GLUCOSE-CAPILLARY: 101 mg/dL — AB (ref 65–99)
GLUCOSE-CAPILLARY: 115 mg/dL — AB (ref 65–99)
Glucose-Capillary: 96 mg/dL (ref 65–99)
Glucose-Capillary: 120 mg/dL — ABNORMAL HIGH (ref 65–99)

## 2015-09-21 LAB — MAGNESIUM: Magnesium: 1.6 mg/dL — ABNORMAL LOW (ref 1.7–2.4)

## 2015-09-21 NOTE — Progress Notes (Signed)
Per Dr Caryn Section, ok to remove peripheral IV.  Pt has accessed port a cath.  IV removed, pt tolerated well.

## 2015-09-21 NOTE — Progress Notes (Signed)
[facility] PROGRESS NOTE  Veronica Stevenson [id number] DOB: 08/24/1933 DOA: 09/18/2015 PCP: [name], NP    Code Status: Full code Family Communication: Discussed with patient; family not available Disposition Plan: Discharge when clinically appropriate, likely in the next couple of days.   Consultants:  Oncology  Procedures:  None  Antibiotics:  None  HPI/Subjective: Patient still has swelling in her legs and they are somewhat uncomfortable. However SCDs seem to be helping.  Objective: Filed Vitals:   09/20/15 2137 09/21/15 0545  BP: 104/43 109/59  Pulse: 86 96  Temp: 97.5 F (36.4 C) 98.2 F (36.8 C)  Resp: 18 18   oxygen saturation 100%.  Intake/Output Summary (Last 24 hours) at 09/21/15 1604 Last data filed at 09/21/15 1032  Gross per 24 hour  Intake 734.83 ml  Output    300 ml  Net 434.83 ml   Filed Weights   09/18/15 1508 09/18/15 2307  Weight: 59.875 kg (132 lb) 58.5 kg (128 lb 15.5 oz)    Exam:   General:  Pleasant 81 year old woman in no acute distress.  Cardiovascular: S1, S2, with a soft systolic murmur.  Respiratory: Clear anteriorly with decreased breath sounds in the bases.  Abdomen: Positive bowel sounds, soft, nontender, nondistended.  Musculoskeletal/extremity: 1-2+ bilateral lower extremity pitting edema was some tenderness. No erythema. Right upper extremity with 1+ nonpitting edema. No edema on the left upper extremity.  Neurologic: She is alert and oriented 3. Her speech is clear. Cranial nerves are grossly intact.  Data Reviewed: Basic Metabolic Panel:  Recent Labs Lab 09/18/15 1555 09/19/15 0245 09/19/15 0939 09/20/15 0600 09/21/15 0619  NA 142 141  --  139 139  K 3.1* 2.8*  --  3.8 3.8  CL 114* 112*  --  114* 115*  CO2 16* 18*  --  16* 21*  GLUCOSE 102* 117*  --  124* 105*  BUN 25* 26*  --  29* 27*  CREATININE 2.01* 2.00*  --  2.05* 1.66*  CALCIUM 8.0* 8.0*  --  7.6* 7.4*  MG  --   --  1.6*   --  1.6*   Liver Function Tests:  Recent Labs Lab 09/18/15 1555  AST 55*  ALT 34  ALKPHOS 91  BILITOT 1.0  PROT 4.9*  ALBUMIN 1.8*   No results for input(s): LIPASE, AMYLASE in the last 168 hours. No results for input(s): AMMONIA in the last 168 hours. CBC:  Recent Labs Lab 09/18/15 1555 09/19/15 0245 09/20/15 0600 09/21/15 0619  WBC 18.8* 20.5* 21.1* 16.4*  NEUTROABS 15.9*  --   --   --   HGB 9.2* 9.6* 8.7* 8.5*  HCT 25.6* 27.2* 24.4* 23.7*  MCV 80.3 81.0 80.8 80.9  PLT 410* 419* 367 328   Cardiac Enzymes: No results for input(s): CKTOTAL, CKMB, CKMBINDEX, TROPONINI in the last 168 hours. BNP (last 3 results) No results for input(s): BNP in the last 8760 hours.  ProBNP (last 3 results) No results for input(s): PROBNP in the last 8760 hours.  CBG:  Recent Labs Lab 09/20/15 1134 09/20/15 1658 09/20/15 2140 09/21/15 0757 09/21/15 1142  GLUCAP 112* 108* 133* 96 120*    Recent Results (from the past 240 hour(s))  Culture, blood (routine x 2)     Status: None (Preliminary result)   Collection Time: 09/18/15 10:53 PM  Result Value Ref Range Status   Specimen Description BLOOD LEFT HAND  Final   Special Requests BOTTLES DRAWN AEROBIC AND ANAEROBIC [place]  Final   Culture NO GROWTH 2 DAYS  Final   Report Status PENDING  Incomplete  Culture, blood (routine x 2)     Status: None (Preliminary result)   Collection Time: 09/18/15 10:56 PM  Result Value Ref Range Status   Specimen Description BLOOD LEFT HAND  Final   Special Requests BOTTLES DRAWN AEROBIC AND ANAEROBIC 4CC EACH  Final   Culture NO GROWTH 2 DAYS  Final   Report Status PENDING  Incomplete     Studies: No results found.  Scheduled Meds: . aspirin EC  81 mg Oral Daily  . atorvastatin  10 mg Oral q1800  . enoxaparin (LOVENOX) injection  60 mg Subcutaneous Q24H  . furosemide  20 mg Oral Daily  . insulin aspart  0-5 Units Subcutaneous QHS  . insulin aspart  0-9 Units Subcutaneous TID WC  .  metoprolol succinate  25 mg Oral QPM   Continuous Infusions: . sodium chloride 0.45 % 1,000 mL with potassium chloride 20 mEq, sodium bicarbonate 50 mEq infusion 70 mL/hr at 09/20/15 0858   Assessment and plan:  Principal Problem:   Bilateral leg edema Active Problems:   Essential hypertension   Malignant neoplasm of head of pancreas (HCC)   Hypokalemia   AKI (acute kidney injury) ([place])   CKD (chronic kidney disease), stage II   Coagulopathy (HCC)   Anemia of chronic disease   Protein-calorie malnutrition, severe (HCC)   Diabetes mellitus with complication ([place])   Spinal stenosis of lumbar region   1. Bilateral lower extremity edema. For evaluation, a number studies were ordered. Bilateral lower extremity ultrasound imaging revealed no DVT. Sonographic survey of the IVC and iliac veins revealed patency with no evidence of occlusion. MRI of the L-spine revealed multilevel severe multifactorial spinal stenosis and possible paraspinous edema possibly related to previous radiation therapy. -Patient was started on IV Lasix, but do to perceived volume depletion, it was discontinued. Oral Lasix was started with close monitoring of her renal function. -SCDs were added for compression. -Review of her chart revealed that her EF was 60-65% with grade 1 diastolic dysfunction [id number]. -Oncologist, Dr. Whitney Muse reported that the edema could be from Gemzar and recent dexamethasone.  Pancreatic cancer, status post resection. Patient is under the care of oncologist, Dr. Whitney Muse. Patient had been receiving adjuvant therapy with single agent Gemzar. Because of the edema, Dr. Whitney Muse will be giving her a break from Gemzar. -Patient has no appreciable abdominal pain.  Right upper extremity cephalic vein DVT. The patient was diagnosed prior to hospitalization on 09/12/2015, by oncology. Xarelto was started. However, several days ago, the patient discontinued it because of subjective side effects with  nausea and weakness. -IV heparin was started. The level increased to greater than 200, so it was held temporally by pharmacy. -Her INR was elevated at 3.49 on admission, which may have been secondary to IV heparin started. -Heparin was discontinued in favor of Lovenox which could be an ideal outpatient anticoagulant for the patient who did not tolerate oral anticoagulation.  Leukocytosis. There appears to be no active infection on exam or on clinical studies. She did have a significant leukocytosis, on admission but this could've been secondary to recent steroid treatment by oncology. -Blood cultures are ordered in the ED and have been negative to date. -Broad-spectrum IV antibiotics were started, but were discontinued when there was no active source of infection. -Her white blood cell count is trending down. We'll continue to monitor.  Hypertension. The patient is treated chronically  with losartan and Toprol-XL. Losartan is being held due to acute kidney injury. Toprol was restarted with parameters.  Acute kidney injury with possible mild chronic kidney disease. Patient's creatinine was 2.01 on admission. On 09/12/15, it was 1.46 and 1.07 in 11 2016. She is treated with losartan chronically. It is currently on hold. -The etiology of her acute renal is unclear, but the likely etiology is prerenal azotemia and mild volume depletion. In review of adverse reactions from Gemzar, renal failure is listed as one. -We'll continue gently hydrating the patient. Gemzar has been withheld by oncology. We'll continue to monitor.  Metabolic acidosis.  The patient had mild lactic acidosis ranging from 1.6-2.9. Her CO2 was 16 on admission. -Bicarbonate was added to the IV fluids. We'll continue to monitor.  Hypokalemia and hypomagnesemia. Patient's serum potassium was 2.8 on admission, which may have been secondary to Lasix. Her magnesium level was poor line low at 1.6. -She was treated with IV potassium and  oral potassium. She was given magnesium sulfate IV. -Her potassium has improved.  Type 2 diabetes mellitus. The patient was recently started on metformin by her PCP. She stopped taking it because of loose stools and nausea. Sliding scale NovoLog was started during hospitalization. Her CBGs have been reasonable.   Time spent: 30 minutes    [facility] Pager [phone]. If 7PM-7AM, please contact night-coverage at [email or web address], password [facility] 09/21/2015, 4:04 PM  LOS: 3 days

## 2015-09-22 LAB — BASIC METABOLIC PANEL
Anion gap: 4 — ABNORMAL LOW (ref 5–15)
BUN: 23 mg/dL — AB (ref 6–20)
CHLORIDE: 113 mmol/L — AB (ref 101–111)
CO2: 22 mmol/L (ref 22–32)
CREATININE: 1.34 mg/dL — AB (ref 0.44–1.00)
Calcium: 7.3 mg/dL — ABNORMAL LOW (ref 8.9–10.3)
GFR calc Af Amer: 42 mL/min — ABNORMAL LOW (ref >60)
GFR calc non Af Amer: 36 mL/min — ABNORMAL LOW (ref >60)
GLUCOSE: 95 mg/dL (ref 65–99)
Potassium: 3.6 mmol/L (ref 3.5–5.1)
SODIUM: 139 mmol/L (ref 135–145)

## 2015-09-22 LAB — GLUCOSE, CAPILLARY
GLUCOSE-CAPILLARY: 85 mg/dL (ref 65–99)
GLUCOSE-CAPILLARY: 122 mg/dL — AB (ref 65–99)
Glucose-Capillary: 108 mg/dL — ABNORMAL HIGH (ref 65–99)
Glucose-Capillary: 68 mg/dL (ref 65–99)

## 2015-09-22 LAB — CBC
HCT: 23.4 % — ABNORMAL LOW (ref 36.0–46.0)
HEMOGLOBIN: 8.5 g/dL — AB (ref 12.0–15.0)
MCH: 29.2 pg (ref 26.0–34.0)
MCHC: 36.3 g/dL — AB (ref 30.0–36.0)
MCV: 80.4 fL (ref 78.0–100.0)
Platelets: 291 10*3/uL (ref 150–400)
RBC: 2.91 MIL/uL — ABNORMAL LOW (ref 3.87–5.11)
RDW: 18.2 % — ABNORMAL HIGH (ref 11.5–15.5)
WBC: 11.6 10*3/uL — ABNORMAL HIGH (ref 4.0–10.5)

## 2015-09-22 LAB — HEMOGLOBIN A1C
HEMOGLOBIN A1C: 6.4 % — AB (ref 4.8–5.6)
Mean Plasma Glucose: 137 mg/dL

## 2015-09-22 LAB — HEPARIN ANTI-XA: Heparin LMW: 0.55 IU/mL

## 2015-09-22 MED ORDER — FUROSEMIDE 20 MG PO TABS
20.0000 mg | ORAL_TABLET | Freq: Once | ORAL | Status: AC
  Administered 2015-09-22: 20 mg via ORAL
  Filled 2015-09-22: qty 1

## 2015-09-22 MED ORDER — FUROSEMIDE 40 MG PO TABS
40.0000 mg | ORAL_TABLET | Freq: Every day | ORAL | Status: DC
  Administered 2015-09-23: 40 mg via ORAL
  Filled 2015-09-22: qty 1

## 2015-09-22 NOTE — Clinical Social Work Note (Signed)
Clinical Social Work Assessment  Patient Details  Name: Veronica Stevenson MRN: [id number] Date of Birth: [birth year]  Date of referral:  09/22/15               Reason for consult:  Discharge Planning                Permission sought to share information with:  Family Supports Permission granted to share information::  Yes, Verbal Permission Granted  Name::     Insurance underwriter::     Relationship::  sister  Contact Information:     Housing/Transportation Living arrangements for the past 2 months:  Single Family Home Source of Information:  Patient, Other (Comment Required) (sister) Patient Interpreter Needed:  None Criminal Activity/Legal Involvement Pertinent to Current Situation/Hospitalization:  No - Comment as needed Significant Relationships:  Siblings Lives with:  Self Do you feel safe going back to the place where you live?  No Need for family participation in patient care:  Yes (Comment)  Care giving concerns:  Pt lives alone.    Social Worker assessment / plan:  CSW met with pt and pt's sister, Zigmund Daniel at bedside with pt's permission. Pt alert and oriented and reports she lives alone. She states she fell out of bed on Thursday and was able to crawl to the door when her cousin came to pick her up for an appointment. Pt admitted due to edema which she said she has been told is likely from her chemotherapy. Pt shared she was diagnosed with pancreatic cancer in August of this year and has been receiving chemo at [facility]. She states this is going to be on hold until after the first of the year. At baseline, pt is independent and still drives. Pt is deconditioned and PT recommending SNF. CSW discussed placement process and insurance authorization. Pt requests placement in [place]. They are making plans for pt to live with [facility] following SNF.   Employment status:  Retired Nurse, adult PT Recommendations:  [place] /  Referral to community resources:  [place]  Patient/Family's Response to care:  When asked about feelings regarding SNF, pt states, "You do what you have to do."   Patient/Family's Understanding of and Emotional Response to Diagnosis, Current Treatment, and Prognosis:  Pt aware of admission diagnosis and treatment plan. She shared that her cancer diagnosis has changed her lifestyle and she continues to adjust to this. CSW provided support.   Emotional Assessment Appearance:  Appears stated age Attitude/Demeanor/Rapport:  Other (Cooperative) Affect (typically observed):  Appropriate Orientation:  Oriented to Self, Oriented to Place, Oriented to  Time, Oriented to Situation Alcohol / Substance use:  Not Applicable Psych involvement (Current and /or in the community):  No (Comment)  Discharge Needs  Concerns to be addressed:  Discharge Planning Concerns Readmission within the last 30 days:  No Current discharge risk:  Physical Impairment Barriers to Discharge:  Continued Medical Work up   [employer], [employer], [place] 09/22/2015, 3:24 PM [phone]

## 2015-09-22 NOTE — NC FL2 (Signed)
[place] LEVEL OF CARE SCREENING TOOL     IDENTIFICATION  Patient Name: Veronica Stevenson Birthdate: 04-07-1933 Sex: female Admission Date (Current Location): 09/18/2015  [facility] and [place] Number:     Facility and Address:  [facility] [address] [place]      Provider Number: [id number]  Attending Physician Name and Address:  Rexene Alberts, MD  Relative Name and Phone Number:       Current Level of Care: Hospital Recommended Level of Care: [place] Prior Approval Number:    Date Approved/Denied:   PASRR Number: [id number]  Discharge Plan: SNF    Current Diagnoses: Patient Active Problem List   Diagnosis Date Noted  . Spinal stenosis of lumbar region 09/20/2015  . Bilateral leg edema 09/20/2015  . Sepsis ([place]) 09/19/2015  . Hypokalemia 09/19/2015  . AKI (acute kidney injury) ([place]) 09/19/2015  . CKD (chronic kidney disease), stage II 09/19/2015  . Coagulopathy ([place]) 09/19/2015  . Anemia of chronic disease 09/19/2015  . Protein-calorie malnutrition, severe ([place]) 09/19/2015  . Diabetes mellitus with complication ([place]) 0000000  . Leg swelling 09/18/2015  . Malignant neoplasm of head of pancreas ([place])   . Pancreatic cancer ([place]) 07/04/2015  . Obstructive jaundice 04/22/2015  . Elevated lipase 02/25/2015  . Abnormal magnetic resonance cholangiopancreatography (MRCP) 02/25/2015  . Loss of weight 02/25/2015  . Abdominal pain 02/25/2015  . Dyspepsia 01/23/2015  . Acute pancreatitis 01/08/2015  . HLD (hyperlipidemia) 01/08/2015  . Essential hypertension 01/08/2015  . Hyperglycemia 01/08/2015  . Hypertension 07/26/2013  . Hypercholesteremia 07/26/2013    Orientation RESPIRATION BLADDER Height & Weight    Self, Time, Situation, Place  Normal Continent 5\' 4"  (162.6 cm) 128 lbs.  BEHAVIORAL SYMPTOMS/MOOD NEUROLOGICAL BOWEL NUTRITION STATUS  Other (Comment) (n/a)  (n/a) Continent Diet (Carb modified)   AMBULATORY STATUS COMMUNICATION OF NEEDS Skin   Extensive Assist Verbally Normal                       Personal Care Assistance Level of Assistance  Bathing, Feeding, Dressing Bathing Assistance: Maximum assistance Feeding assistance: Limited assistance Dressing Assistance: Maximum assistance     Functional Limitations Info  Sight, Hearing, Speech Sight Info: Adequate Hearing Info: Adequate Speech Info: Adequate    SPECIAL CARE FACTORS FREQUENCY  PT (By licensed PT)     PT Frequency: 5              Contractures      Additional Factors Info  Insulin Sliding Scale Code Status Info: Full code Allergies Info: Codeine, Omeprazole, Penicilllins   Insulin Sliding Scale Info: 4x daily       Current Medications (09/22/2015):  This is the current hospital active medication list Current Facility-Administered Medications  Medication Dose Route Frequency Provider Last Rate Last Dose  . aspirin EC tablet 81 mg  81 mg Oral Daily Orvan Falconer, MD   81 mg at 09/22/15 1005  . atorvastatin (LIPITOR) tablet 10 mg  10 mg Oral q1800 Orvan Falconer, MD   10 mg at 09/21/15 1723  . enoxaparin (LOVENOX) injection 60 mg  60 mg Subcutaneous Q24H Rexene Alberts, MD   60 mg at 09/22/15 1229  . [START ON 09/23/2015] furosemide (LASIX) tablet 40 mg  40 mg Oral Daily Rexene Alberts, MD      . HYDROmorphone (DILAUDID) injection 0.5 mg  0.5 mg Intravenous Q4H PRN Rexene Alberts, MD   0.5 mg at 09/22/15 1005  .  insulin aspart (novoLOG) injection 0-5 Units  0-5 Units Subcutaneous QHS Rexene Alberts, MD   0 Units at 09/20/15 2200  . insulin aspart (novoLOG) injection 0-9 Units  0-9 Units Subcutaneous TID WC Rexene Alberts, MD   0 Units at 09/20/15 0800  . metoprolol succinate (TOPROL-XL) 24 hr tablet 25 mg  25 mg Oral QPM Orvan Falconer, MD   25 mg at 09/21/15 1723  . sodium chloride 0.45 % 1,000 mL with potassium chloride 20 mEq, sodium bicarbonate 50 mEq infusion   Intravenous Continuous Rexene Alberts, MD 40 mL/hr  at 09/21/15 1723     Facility-Administered Medications Ordered in Other Encounters  Medication Dose Route Frequency Provider Last Rate Last Dose  . sodium chloride 0.9 % injection 10 mL  10 mL Intravenous PRN Patrici Ranks, MD   10 mL at 08/29/15 0940     Discharge Medications: Please see discharge summary for a list of discharge medications.  Relevant Imaging Results:  Relevant Lab Results:   Additional Information Followed by [facility]  Benay [place], [place]

## 2015-09-22 NOTE — Evaluation (Signed)
Physical Therapy Evaluation Patient Details Name: Veronica Stevenson MRN: [id number] DOB: [birth year] Today's Date: 09/22/2015   History of Present Illness  Veronica Stevenson is an 81 y.o. 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 Unclear as to when she stopped, as she told me a few days ago, and to the RN yesterday. She was getting up, but fell, and she said it was due to the pain. She denied headache, back pain, fever, chills, coughs, abdominal pain, GI or GU symptoms. Evalaution in the ER included a marked leukocytosis of 18K, and elevated lactic acid of 2.9, normal TSH, negative UA for any infection. An MRI of the LS spine was obtained, and she has possible paraspinous edema, possibly related to prior radiation therapy, and severe spinal stenosis at L4,L5 with no evidence of metastatic disease. Hospitalist was asked to admit her for further work up.   Clinical Impression   This is a very pleasant pt who was seen for evaluation this morning.  She was alert and oriented, very cooperative.  She reports being very active and independent prior to this illness and was living alone.  Currently, she has edema in the LEs which is bad enough to limit her ability to flex her knees.  This therefore limits her ability to transfer from sit to stand.  In addition, she has profound weakness in both LEs, right weaker than the left.  This weakness limits her transfers in and out of bed and in walking.  She now needs a walker to ambulate 6' with a very slow and labored gait.  She clearly needs to discharge to SNF and she is in agreement with this.     Follow Up Recommendations SNF    Equipment Recommendations  Rolling walker with 5" wheels     Recommendations for Other Services   OT    Precautions / Restrictions Precautions Precautions: Fall Restrictions Weight Bearing Restrictions: No      Mobility  Bed Mobility Overal bed mobility: Needs Assistance Bed Mobility: Supine to Sit;Sit to Supine     Supine to sit: Min assist;HOB elevated Sit to supine: Max assist   General bed mobility comments: unable to lift LEs into bed due to weakness  Transfers Overall transfer level: Needs assistance Equipment used: Rolling walker (2 wheeled) Transfers: Sit to/from Stand Sit to Stand: Max assist         General transfer comment: because knee flexion is limited bilaterally due to edema, she is unable to get her feet underneath her enough to stand  Ambulation/Gait Ambulation/Gait assistance: Min assist Ambulation Distance (Feet): 6 Feet Assistive device: Rolling walker (2 wheeled) Gait Pattern/deviations: Decreased stride length;Decreased dorsiflexion - right;Decreased dorsiflexion - left;Shuffle Gait velocity: gait is extremely slow and labored Gait velocity interpretation: <1.8 ft/sec, indicative of risk for recurrent falls    Stairs            Wheelchair Mobility    Modified Rankin (Stroke Patients Only)       Balance Overall balance assessment: No apparent balance deficits (not formally assessed)  Pertinent Vitals/Pain Pain Assessment: No/denies pain    Home Living Family/patient expects to be discharged to:: Skilled nursing facility Living Arrangements: Alone                    Prior Function Level of Independence: Independent               Hand Dominance   Dominant Hand: Right    Extremity/Trunk Assessment               Lower Extremity Assessment: Generalized weakness (RLE about 1/2 grade weaker than LLE, knee flexion to 80* due to edema)      Cervical / Trunk Assessment: Kyphotic  Communication    Communication: No difficulties  Cognition Arousal/Alertness: Awake/alert Behavior During Therapy: WFL for tasks assessed/performed Overall Cognitive Status: Within Functional Limits for tasks assessed                      General Comments      Exercises        Assessment/Plan    PT Assessment Patient needs continued PT services  PT Diagnosis Difficulty walking;Generalized weakness   PT Problem List Decreased strength;Decreased range of motion;Decreased activity tolerance;Decreased mobility;Decreased knowledge of use of DME;Pain  PT Treatment Interventions Gait training;Functional mobility training;Therapeutic exercise   PT Goals (Current goals can be found in the Care Plan section) Acute Rehab PT Goals Patient Stated Goal: none stated PT Goal Formulation: With patient Time For Goal Achievement: 10/06/15 Potential to Achieve Goals: Good    Frequency Min 3X/week   Barriers to discharge Decreased caregiver support lives alone    Co-evaluation               End of Session Equipment Utilized During Treatment: Gait belt Activity Tolerance: Patient limited by fatigue Patient left: in chair;with call bell/phone within reach           Time: 1130-1201 PT Time Calculation (min) (ACUTE ONLY): 31 min   Charges:   PT Evaluation $Initial PT Evaluation Tier I: 1 Procedure     PT G CodesDemetrios Isaacs L  PT 09/22/2015, 12:18 PM [phone]

## 2015-09-22 NOTE — Progress Notes (Signed)
[facility] PROGRESS NOTE  KINSHASA EDINGER [id number] DOB: 11-Feb-1933 DOA: 09/18/2015 PCP: [name], NP    Code Status: Full code Family Communication: Discussed with with patient's sister. Disposition Plan: Discharge when clinically appropriate to a skilled nursing facility, and 1-2 days.   Consultants:  Oncology  Procedures:  None  Antibiotics:  None  HPI/Subjective: Patient still has swelling in her legs and they are somewhat uncomfortable. However SCDs seem to be helping.  Objective: Filed Vitals:   09/21/15 2035 09/22/15 0557  BP: 110/50 132/66  Pulse: 89 58  Temp: 98.7 F (37.1 C) 98.2 F (36.8 C)  Resp: 18 18   oxygen saturation 98%. No intake or output data in the 24 hours ending 09/22/15 1255 Filed Weights   09/18/15 1508 09/18/15 2307  Weight: 59.875 kg (132 lb) 58.5 kg (128 lb 15.5 oz)    Exam:   General:  Pleasant 81 year old woman in no acute distress.  Cardiovascular: S1, S2, with a soft systolic murmur.  Respiratory: Clear anteriorly with decreased breath sounds in the bases.  Abdomen: Positive bowel sounds, soft, nontender, nondistended.  Musculoskeletal/extremity: 1-2+ bilateral lower extremity pitting edema extending to her thighs with  was some tenderness. No erythema. Right upper extremity with 1+ nonpitting edema. No edema on the left upper extremity.  Neurologic: She is alert and oriented 3. Her speech is clear. Cranial nerves are grossly intact.  Data Reviewed: Basic Metabolic Panel:  Recent Labs Lab 09/18/15 1555 09/19/15 0245 09/19/15 0939 09/20/15 0600 09/21/15 0619 09/22/15 0744  NA 142 141  --  139 139 139  K 3.1* 2.8*  --  3.8 3.8 3.6  CL 114* 112*  --  114* 115* 113*  CO2 16* 18*  --  16* 21* 22  GLUCOSE 102* 117*  --  124* 105* 95  BUN 25* 26*  --  29* 27* 23*  CREATININE 2.01* 2.00*  --  2.05* 1.66* 1.34*  CALCIUM 8.0* 8.0*  --  7.6* 7.4* 7.3*  MG  --   --  1.6*  --  1.6*  --    Liver  Function Tests:  Recent Labs Lab 09/18/15 1555  AST 55*  ALT 34  ALKPHOS 91  BILITOT 1.0  PROT 4.9*  ALBUMIN 1.8*   No results for input(s): LIPASE, AMYLASE in the last 168 hours. No results for input(s): AMMONIA in the last 168 hours. CBC:  Recent Labs Lab 09/18/15 1555 09/19/15 0245 09/20/15 0600 09/21/15 0619 09/22/15 0744  WBC 18.8* 20.5* 21.1* 16.4* 11.6*  NEUTROABS 15.9*  --   --   --   --   HGB 9.2* 9.6* 8.7* 8.5* 8.5*  HCT 25.6* 27.2* 24.4* 23.7* 23.4*  MCV 80.3 81.0 80.8 80.9 80.4  PLT 410* 419* 367 328 291   Cardiac Enzymes: No results for input(s): CKTOTAL, CKMB, CKMBINDEX, TROPONINI in the last 168 hours. BNP (last 3 results) No results for input(s): BNP in the last 8760 hours.  ProBNP (last 3 results) No results for input(s): PROBNP in the last 8760 hours.  CBG:  Recent Labs Lab 09/21/15 1142 09/21/15 1641 09/21/15 2034 09/22/15 0800 09/22/15 1150  GLUCAP 120* 101* 115* 85 108*    Recent Results (from the past 240 hour(s))  Culture, blood (routine x 2)     Status: None (Preliminary result)   Collection Time: 09/18/15 10:53 PM  Result Value Ref Range Status   Specimen Description BLOOD LEFT HAND  Final   Special Requests BOTTLES DRAWN AEROBIC AND  ANAEROBIC 6CC EACH  Final   Culture NO GROWTH 4 DAYS  Final   Report Status PENDING  Incomplete  Culture, blood (routine x 2)     Status: None (Preliminary result)   Collection Time: 09/18/15 10:56 PM  Result Value Ref Range Status   Specimen Description BLOOD LEFT HAND  Final   Special Requests BOTTLES DRAWN AEROBIC AND ANAEROBIC 4CC EACH  Final   Culture NO GROWTH 4 DAYS  Final   Report Status PENDING  Incomplete     Studies: No results found.  Scheduled Meds: . aspirin EC  81 mg Oral Daily  . atorvastatin  10 mg Oral q1800  . enoxaparin (LOVENOX) injection  60 mg Subcutaneous Q24H  . furosemide  20 mg Oral Daily  . insulin aspart  0-5 Units Subcutaneous QHS  . insulin aspart  0-9  Units Subcutaneous TID WC  . metoprolol succinate  25 mg Oral QPM   Continuous Infusions: . sodium chloride 0.45 % 1,000 mL with potassium chloride 20 mEq, sodium bicarbonate 50 mEq infusion 40 mL/hr at 09/21/15 1723   Assessment and plan:  Principal Problem:   Bilateral leg edema Active Problems:   Essential hypertension   Malignant neoplasm of head of pancreas (HCC)   Hypokalemia   AKI (acute kidney injury) ([place])   CKD (chronic kidney disease), stage II   Coagulopathy (HCC)   Anemia of chronic disease   Protein-calorie malnutrition, severe (HCC)   Diabetes mellitus with complication ([place])   Spinal stenosis of lumbar region   1. Bilateral lower extremity edema. For evaluation, a number studies were ordered. Bilateral lower extremity ultrasound imaging revealed no DVT. Sonographic survey of the IVC and iliac veins revealed patency with no evidence of occlusion. MRI of the L-spine revealed multilevel severe multifactorial spinal stenosis and possible paraspinous edema possibly related to previous radiation therapy. -Patient was started on IV Lasix, but due to perceived volume depletion, it was discontinued. Oral Lasix was started with close monitoring of her renal function. -SCDs were added for compression.   -Review of her chart revealed that her EF was 60-65% with grade 1 diastolic dysfunction [id number]. -Oncologist, Dr. Whitney Muse reported that the edema could be from Gemzar and recent dexamethasone. -Treatment is difficult in that IV Lasix would volume deplete her and worsen her renal function, but to little Lasix would not decrease her edema quickly enough.  -We'll increase the Lasix to 40 mg daily. Will order compression stockings as well.  Pancreatic cancer, status post resection. Patient is under the care of oncologist, Dr. Whitney Muse. Patient had been receiving adjuvant therapy with single agent Gemzar. Because of the edema, Dr. Whitney Muse will be giving her a break from  Gemzar. -Patient has no appreciable abdominal pain.  Right upper extremity cephalic vein DVT. The patient was diagnosed prior to hospitalization on 09/12/2015, by oncology. Xarelto was started. However, several days ago, the patient discontinued it because of subjective side effects with nausea and weakness. -IV heparin was started. The level increased to greater than 200, so it was held temporally by pharmacy. -Her INR was elevated at 3.49 on admission, which may have been secondary to IV heparin started. -Heparin was discontinued in favor of Lovenox which could be an ideal outpatient anticoagulant for the patient who did not tolerate oral anticoagulation.  Leukocytosis. There appears to be no active infection on exam or on clinical studies. She did have a significant leukocytosis, on admission but this could've been secondary to recent steroid treatment by oncology. -  Blood cultures are ordered in the ED and have been negative to date. -Broad-spectrum IV antibiotics were started, but were discontinued when there was no active source of infection. -Her white blood cell count is trending down. We'll continue to monitor.  Hypertension. The patient is treated chronically with losartan and Toprol-XL. Losartan is being held due to acute kidney injury. Toprol was restarted with parameters.  Acute kidney injury with possible mild chronic kidney disease. Patient's creatinine was 2.01 on admission. On 09/12/15, it was 1.46 and 1.07 in 11 2016. She is treated with losartan chronically. It is currently on hold. -The etiology of her acute renal is unclear, but the likely etiology is prerenal azotemia and mild volume depletion. In review of adverse reactions from Gemzar, renal failure is listed as one. -We'll continue gently hydrating the patient. Gemzar has been withheld by oncology. We'll continue to monitor.  Metabolic acidosis.  The patient had mild lactic acidosis ranging from 1.6-2.9. Her CO2 was 16  on admission. -Bicarbonate was added to the IV fluids.her bicarbonate has improved.   Hypokalemia and hypomagnesemia. Patient's serum potassium was 2.8 on admission, which may have been secondary to Lasix. Her magnesium level was poor line low at 1.6. -She was treated with IV potassium and oral potassium. She was given magnesium sulfate IV. -Her potassium has improved.  Type 2 diabetes mellitus. The patient was recently started on metformin by her PCP. She stopped taking it because of loose stools and nausea. Sliding scale NovoLog was started during hospitalization. Her CBGs have been reasonable.  Physical deconditioning. The patient was evaluated by the physical therapist who recommended skilled nursing facility placement. The patient is in agreement. This was discussed with her sister. Social worker notified.   Time spent: 30 minutes    [facility] Pager [phone]. If 7PM-7AM, please contact night-coverage at [email or web address], password [email or web address] 09/22/2015, 12:55 PM  LOS: 4 days

## 2015-09-23 ENCOUNTER — Inpatient Hospital Stay
Admission: RE | Admit: 2015-09-23 | Discharge: 2016-02-23 | Disposition: A | Discharge status: Hospice | Payer: Medicare Other | Source: Ambulatory Visit | Attending: Internal Medicine | Admitting: Internal Medicine

## 2015-09-23 DIAGNOSIS — E43 Unspecified severe protein-calorie malnutrition: Secondary | ICD-10-CM

## 2015-09-23 LAB — CULTURE, BLOOD (ROUTINE X 2)
CULTURE: NO GROWTH
Culture: NO GROWTH

## 2015-09-23 LAB — CBC
HEMATOCRIT: 22.5 % — AB (ref 36.0–46.0)
HEMOGLOBIN: 8 g/dL — AB (ref 12.0–15.0)
MCH: 29.1 pg (ref 26.0–34.0)
MCHC: 35.6 g/dL (ref 30.0–36.0)
MCV: 81.8 fL (ref 78.0–100.0)
Platelets: 273 10*3/uL (ref 150–400)
RBC: 2.75 MIL/uL — AB (ref 3.87–5.11)
RDW: 18.4 % — ABNORMAL HIGH (ref 11.5–15.5)
WBC: 10.4 10*3/uL (ref 4.0–10.5)

## 2015-09-23 LAB — BASIC METABOLIC PANEL
BUN: 22 mg/dL — ABNORMAL HIGH (ref 6–20)
CALCIUM: 7.4 mg/dL — AB (ref 8.9–10.3)
CHLORIDE: 115 mmol/L — AB (ref 101–111)
CO2: 24 mmol/L (ref 22–32)
Creatinine, Ser: 1.19 mg/dL — ABNORMAL HIGH (ref 0.44–1.00)
GFR calc non Af Amer: 42 mL/min — ABNORMAL LOW (ref >60)
GFR, EST AFRICAN AMERICAN: 49 mL/min — AB (ref >60)
Glucose, Bld: 75 mg/dL (ref 65–99)
POTASSIUM: 3.7 mmol/L (ref 3.5–5.1)
SODIUM: 141 mmol/L (ref 135–145)

## 2015-09-23 LAB — GLUCOSE, CAPILLARY
GLUCOSE-CAPILLARY: 59 mg/dL — AB (ref 65–99)
GLUCOSE-CAPILLARY: 66 mg/dL (ref 65–99)
Glucose-Capillary: 92 mg/dL (ref 65–99)
Glucose-Capillary: 130 mg/dL — ABNORMAL HIGH (ref 65–99)

## 2015-09-23 MED ORDER — OXYCODONE HCL 5 MG PO TABS: 5.0000 mg | ORAL_TABLET | Freq: Four times a day (QID) | ORAL | Status: DC | PRN

## 2015-09-23 MED ORDER — POTASSIUM CHLORIDE ER 10 MEQ PO TBCR: 10.0000 meq | EXTENDED_RELEASE_TABLET | Freq: Two times a day (BID) | ORAL | Status: DC

## 2015-09-23 MED ORDER — ENOXAPARIN SODIUM 60 MG/0.6ML ~~LOC~~ SOLN: 60.0000 mg | SUBCUTANEOUS | Status: DC

## 2015-09-23 MED ORDER — HEPARIN SOD (PORK) LOCK FLUSH 100 UNIT/ML IV SOLN
500.0000 [IU] | INTRAVENOUS | Status: DC
Start: 2015-09-23 — End: 2015-09-23

## 2015-09-23 MED ORDER — FUROSEMIDE 20 MG PO TABS: ORAL_TABLET | ORAL | Status: DC

## 2015-09-23 MED ORDER — METOCLOPRAMIDE HCL 10 MG PO TABS: 10.0000 mg | ORAL_TABLET | Freq: Two times a day (BID) | ORAL | Status: AC | PRN

## 2015-09-23 MED ORDER — HEPARIN SOD (PORK) LOCK FLUSH 100 UNIT/ML IV SOLN
500.0000 [IU] | INTRAVENOUS | Status: DC | PRN
  Filled 2015-09-23: qty 5

## 2015-09-23 MED ORDER — INSULIN GLARGINE 100 UNIT/ML ~~LOC~~ SOLN: 10.0000 [IU] | Freq: Every day | SUBCUTANEOUS | Status: DC

## 2015-09-23 NOTE — Care Management Note (Signed)
Case Management Note  Patient Details  Name: Veronica Stevenson MRN: [id number] Date of Birth: 09-19-1933  PT has recommended SNF. Pt is agreeable and CSW is working with pt to find placement. Anticipate DC today. No CM needs.   Expected Discharge Date:  09/22/15               Expected Discharge Plan:  [place]  In-House Referral:  NA  Discharge planning Services  CM Consult  Post Acute Care Choice:  NA Choice offered to:  NA  DME Arranged:    DME Agency:     HH Arranged:    HH Agency:     Status of Service:  Completed, signed off  Medicare Important Message Given:  Yes Date Medicare IM Given:    Medicare IM give by:    Date Additional Medicare IM Given:    Additional Medicare Important Message give by:     If discussed at [place] of Stay Meetings, dates discussed:  09/23/2015  Additional Comments:  Sherald Barge, RN 09/23/2015, 10:53 AM

## 2015-09-23 NOTE — Discharge Summary (Addendum)
Physician Discharge Summary  Veronica Stevenson QPY:[id number] DOB: [birth year] DOA: 09/18/2015  PCP: [name], NP  Admit date: 09/18/2015 Discharge date: 09/23/2015  Time spent: GREATER THAN 30 minutes  Recommendations for Outpatient Follow-up:  1. THE PATIENT SHOULD FOLLOW-UP WITH ONCOLOGIST, DR. Whitney Muse ON 09/25/15 AT 2:10 PM AS SCHEDULED.  2. Patient was discharged to the [facility]. 3. Recommend follow-up of her CBC and renal function and potassium. 4. Recommend checking the patient's CBGs at the SNF once twice daily. 5. Recommend continued compression stockings.    Discharge Diagnoses:  1. Bilateral lower extremity edema, likely secondary to Gemzar and recent dexamethasone. 2. Pancreatic cancer, status post resection. 3. Right upper extremity cephalic vein DVT, diagnosed prior to hospitalization on 09/12/2015 by oncology. 4. Leukocytosis, likely secondary to recent steroid treatment. 5. Essential hypertension with low-normal blood pressures during hospitalization. -We'll recommend holding Toprol-XL for a systolic blood pressure of less than 100. -Losartan was discontinued. 6. Acute kidney injury with possible stage I and stage II chronic kidney disease per chart review. 7. Metabolic acidosis. 8. Hypokalemia and hypomagnesemia. 9. Severe protein calorie malnutrition. 10. 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 11. Anemia of chronic disease. 13. Spinal stenosis of the lumbar region; possible paraspinous edema at L4-L5, per MRI lumbar spine.   Discharge Condition: Improved, but chronically ill.  Diet recommendation: Heart healthy and carbohydrate modified.  Filed Weights   09/18/15 1508 09/18/15 2307  Weight: 59.875 kg (132 lb) 58.5 kg (128 lb 15.5 oz)    History of present illness:  Patient is an 81 year old woman with a history of pancreatic adencarcinoma, status post Whipple surgery, on chemotherapy with Gemzar, recent diagnosis of right upper extremity  DVT-started on Xarelto, hypertension, and diabetes, who presented to the emergency department on 09/18/2015 with a chief complaint of worsening bilateral lower extremity swelling and pain. She was admitted for further evaluation and management.   Hospital Course:  1. Bilateral lower extremity edema. For evaluation, a number studies were ordered. Bilateral lower extremity ultrasound imaging revealed no DVT. Sonographic survey of the IVC and iliac veins revealed patency with no evidence of occlusion. MRI of the L-spine revealed multilevel severe multifactorial spinal stenosis and possible paraspinous edema possibly related to previous radiation therapy. -Patient was started on IV Lasix, but due to perceived volume depletion, it was discontinued. Oral Lasix was started with close monitoring of her renal function. -SCDs were added for compression.  -Review of her chart revealed that her EF was 60-65% with grade 1 diastolic dysfunction 09/13/5808. -Oncologist, Dr. Whitney Muse reported that the edema could be from Gemzar and recent dexamethasone. She recommended stopping the Gemzar for now. -Treatment with Lasix was somewhat problematic because giving her too much Lasix would cause volume depletion and to little may extend the resolution of the lower extremity edema. At the time of discharge, oral Lasix was increased to 40 mg daily and should be given for 2 more days before transitioning it to 20 mg daily. TED hose were ordered and placed prior to discharge. -Would recommend follow-up of her renal function in 3-5 days.  Pancreatic cancer, status post resection. Patient is under the care of oncologist, Dr. Whitney Muse. Patient had been receiving adjuvant therapy with single agent Gemzar. Because of the edema, Dr. Whitney Muse will be giving her a break from Gemzar. -Patient had no appreciable abdominal pain.  Right upper extremity cephalic vein DVT. The patient was diagnosed prior to hospitalization on 09/12/2015, by  oncology. Xarelto was started. However, several days  before the hospitalization, she discontinued it because of subjective side effects with nausea and weakness. -IV heparin was started. The level increased to greater than 200, so it was held temporally by pharmacy. -Her INR was elevated at 3.49 on admission, which may have been secondary to IV heparin started. -Heparin was discontinued in favor of Lovenox which was thought to be an ideal outpatient anticoagulant for the patient who did not tolerate oral anticoagulation. She was discharged to SNF on daily dosing of Lovenox. -The patient should be treated for 3-6 months, or per the discretion of Dr. Whitney Muse.  Leukocytosis. The patient's white blood cell count was greater than 20,000 on admission. There appeared to be no active infection on exam or on clinical studies. It was thought that the leukocytosis was likely secondary to recent steroid therapy. -Blood cultures are ordered in the ED and have been negative to date. -Broad-spectrum IV antibiotics were started, but were discontinued when there was no active source of infection. -Her white blood cell count trended down to 10.5.  Normocytic anemia. Patient likely has anemia of chronic disease in the setting of malignancy; and possibly anemia from chemotherapy. Her hemoglobin ranged from 8.0-8.5. There was no evidence of active GI or GU bleeding. There was no indication for blood transfusion. -Would defer further monitoring and treatment to Dr. Whitney Muse.  Hypertension. The patient is treated chronically with losartan and Toprol-XL. Losartan was withheld secondary to acute kidney injury and low-normal blood pressures. Toprol was restarted, but with parameters to hold it for systolic blood pressure of less than 100. Would recommend continued monitoring at the SNF. If her blood pressure trends upward, would recommend restarting losartan at a lower dose.  Acute kidney injury with possible mild chronic  kidney disease. Patient's creatinine was 2.01 on admission. On 09/12/15, it was 1.46 and 1.07 in 11 2016. She is treated with losartan chronically. It is withheld. -The etiology of her acute renal is unclear, but the likely etiology is prerenal azotemia and mild volume depletion. In review of adverse reactions from Gemzar, renal failure is listed as one. -She was continued on gentle IV fluids throughout the hospitalization along with Lasix. -Her creatinine improved to 1.34 at the time of discharge.  Metabolic acidosis.  The patient had mild lactic acidosis. Her CO2 was 16 on admission. -Bicarbonate was added to the IV fluids. Her CO2 normalized and was 22 at the time of discharge.   Hypokalemia and hypomagnesemia. Patient's serum potassium was 2.8 on admission, which may have been secondary to Lasix. Her magnesium level was borderline low at 1.6. -She was treated with IV potassium and oral potassium. She was given magnesium sulfate IV. -Her potassium has improved. She was discharged on 10 mEq of KCl twice a day. Would recommend follow-up of her serum potassium and renal function.  Type 2 diabetes mellitus. The patient was recently started on metformin by her PCP. She stopped taking it because of loose stools and nausea. Sliding scale NovoLog was started during the hospitalization. Her CBGs was reasonably controlled. Her hemoglobin A1c was 6.4. She was discharged on Lantus 10 units daily at bedtime.  Physical deconditioning. The patient was evaluated by the physical therapist who recommended skilled nursing facility placement. The patient was in agreement. This was discussed with her sister. S  Procedures:  None  Consultations:  Oncology  Discharge Exam: Filed Vitals:   09/22/15 2012 09/23/15 0424  BP: 100/55 119/49  Pulse: 75 76  Temp: 98 F (36.7 C) 97.4 F (36.3  C)  Resp: 18 20   oxygen saturation 100%.  General: Pleasant 81 year old woman in no acute  distress.  Cardiovascular: S1, S2, with a soft systolic murmur.  Respiratory: Clear anteriorly with decreased breath sounds in the bases.  Abdomen: Positive bowel sounds, soft, nontender, nondistended.  Musculoskeletal/extremity: TED hose are now on; slightly less bilateral lower extremity pitting edema extending to her thighs with was some tenderness. No erythema. Right upper extremity with 1+ nonpitting edema. No edema on the left upper extremity.  Neurologic: She is alert and oriented 3. Her speech is clear. Cranial nerves are grossly intact.   Discharge Instructions   Discharge Instructions    Diet - low sodium heart healthy    Complete by:  As directed      Diet Carb Modified    Complete by:  As directed      Increase activity slowly    Complete by:  As directed           Current Discharge Medication List    START taking these medications   Details  enoxaparin (LOVENOX) 60 MG/0.6ML injection Inject 0.6 mLs (60 mg total) into the skin daily. TREATMENT REGIMEN 3-6 MONTHS. Qty: 0 Syringe    insulin glargine (LANTUS) 100 UNIT/ML injection Inject 0.1 mLs (10 Units total) into the skin at bedtime.      CONTINUE these medications which have CHANGED   Details  furosemide (LASIX) 20 MG tablet TAKE 2 TABLETS DAILY FOR 2 MORE DAYS AND THEN 1 TABLET DAILY THEREAFTER FOR LOWER EXTREMITY EDEMA.   Associated Diagnoses: Bilateral edema of lower extremity    metoCLOPramide (REGLAN) 10 MG tablet Take 1 tablet (10 mg total) by mouth 2 (two) times daily as needed for nausea. And at night Qty: 30 tablet, Refills: 0   Associated Diagnoses: Malignant neoplasm of head of pancreas (HCC)    oxyCODONE (OXY IR/ROXICODONE) 5 MG immediate release tablet Take 1 tablet (5 mg total) by mouth every 6 (six) hours as needed. Qty: 45 tablet, Refills: 0   Associated Diagnoses: Malignant neoplasm of head of pancreas ([place])      CONTINUE these medications which have NOT CHANGED   Details   atorvastatin (LIPITOR) 10 MG tablet Take 10 mg by mouth daily after lunch.     Cholecalciferol (VITAMIN D) 2000 UNITS CAPS Take 1 capsule by mouth every morning.     loperamide (IMODIUM A-D) 2 MG tablet Take 2 mg by mouth as needed for diarrhea or loose stools.   Associated Diagnoses: Malignant neoplasm of head of pancreas (HCC)    metoprolol succinate (TOPROL-XL) 25 MG 24 hr tablet Take 25 mg by mouth every evening.     acetaminophen (TYLENOL) 500 MG tablet Take 500 mg by mouth every 6 (six) hours as needed for mild pain or moderate pain.    lidocaine-prilocaine (EMLA) cream Apply 1 application topically as needed. Qty: 30 g, Refills: 3   Associated Diagnoses: Malignant neoplasm of head of pancreas ([place])      STOP taking these medications     aspirin EC 81 MG tablet      losartan (COZAAR) 100 MG tablet      Gemcitabine HCl (GEMZAR IV)        Allergies  Allergen Reactions  . Codeine Itching  . Omeprazole Nausea And Vomiting  . Penicillins Other (See Comments)    "blacked out"   Follow-up Information    Follow up with Molli Hazard, MD On 09/25/2015.   Specialties:  Hematology and  Oncology, Oncology   Why:  AT 2:10 PM   Contact information:   [address] [place] [address] [phone]        The results of significant diagnostics from this hospitalization (including imaging, microbiology, ancillary and laboratory) are listed below for reference.    Significant Diagnostic Studies: Dg Chest 2 View  09/18/2015  CLINICAL DATA:  Increasing weakness.  Fall today, gastric cancer. EXAM: CHEST  2 VIEW COMPARISON:  CT chest 04/18/2015 and chest radiograph 03/26/2012. FINDINGS: Trachea is midline. Heart size normal. Right IJ power port tip is in the low SVC. Lungs are clear. No pleural fluid. Right hemidiaphragm is chronically elevated. Osseous structures appear grossly intact. IMPRESSION: No acute findings. Electronically Signed   By: Lorin Picket M.D.   On:  09/18/2015 17:20   Mr Lumbar Spine Wo Contrast  09/18/2015  CLINICAL DATA:  Bilateral leg weakness and back pain. History of pancreatic cancer. EXAM: MRI LUMBAR SPINE WITHOUT CONTRAST TECHNIQUE: Multiplanar, multisequence MR imaging of the lumbar spine was performed. No intravenous contrast was administered. COMPARISON:  Abdominal CT 04/18/2015.  Abdominal MRI 02/10/2015. FINDINGS: Scan quality is suboptimal. Segmentation: Conventional anatomy assumed, with the last open disc space designated L5-S1. Alignment: Stable from prior studies with mild convex left scoliosis and grade 1 anterolisthesis at L4-5. Bones: No worrisome osseous lesion, acute fracture or pars defect. No evidence of osseous metastatic disease. There are chronic endplate degenerative changes at L4-5 and L5-S1. Conus medullaris: Extends to the L2 level and appears normal. Paraspinal and other soft tissues: The retroperitoneum is not well visualized by this examination. There is mild paraspinal edema near the thoracolumbar junction, possibly related to prior radiation therapy. There also appears to be dependent fluid within the subcutaneous fat dorsal to the erector spinae musculature, demonstrating fairly homogeneous high T2 and low T1 signal. No evidence of discitis. Disc levels: T12-L1: Mild disc bulging. No spinal stenosis or nerve root encroachment. L1-2:  Normal interspace. L2-3:  The disc appears normal.  Mild bilateral facet hypertrophy. L3-4: Annular disc bulging and moderate bilateral facet hypertrophy contribute to mild spinal stenosis with mild narrowing of the left lateral recess and both foramina. L4-5: There is severe multifactorial spinal stenosis secondary to advanced facet disease, the resulting grade 1 anterolisthesis and annular disc bulging. There is asymmetric narrowing of the right lateral recess and right foramen. L5-S1: Chronic degenerative disc disease with annular disc bulging and paraspinal osteophytes. Mild facet and  ligamentous hypertrophy. There is mild spinal stenosis with mild narrowing of the lateral recesses and foramina bilaterally. IMPRESSION: 1. Multilevel spondylosis as described with similar alignment to previous CT. There is severe multifactorial spinal stenosis at L4-5. 2. No acute osseous findings or evidence of metastatic disease. 3. Possible paraspinous edema superiorly, possibly related to previous radiation therapy. Prominent subcutaneous fluid posteriorly in the back, possibly from similar etiology or stasis. Correlate clinically. Electronically Signed   By: Richardean Sale M.D.   On: 09/18/2015 18:35   US Venous Img Lower Bilateral  09/19/2015  CLINICAL DATA:  Bilateral lower extremity swelling, pain, recent fall EXAM: BILATERAL LOWER EXTREMITY VENOUS DOPPLER ULTRASOUND TECHNIQUE: Gray-scale sonography with graded compression, as well as color Doppler and duplex ultrasound were performed to evaluate the lower extremity deep venous systems from the level of the common femoral vein and including the common femoral, femoral, profunda femoral, popliteal and calf veins including the posterior tibial, peroneal and gastrocnemius veins when visible. The superficial great saphenous vein was also interrogated. Spectral Doppler  was utilized to evaluate flow at rest and with distal augmentation maneuvers in the common femoral, femoral and popliteal veins. COMPARISON:  None. FINDINGS: Limited exam because of peripheral edema. RIGHT LOWER EXTREMITY Common Femoral Vein: No evidence of thrombus. Normal compressibility, respiratory phasicity and response to augmentation. Saphenofemoral Junction: No evidence of thrombus. Normal compressibility and flow on color Doppler imaging. Profunda Femoral Vein: No evidence of thrombus. Normal compressibility and flow on color Doppler imaging. Femoral Vein: No evidence of thrombus. Normal compressibility, respiratory phasicity and response to augmentation. Popliteal Vein: No evidence  of thrombus. Normal compressibility, respiratory phasicity and response to augmentation. Calf Veins: No evidence of thrombus. Normal compressibility and flow on color Doppler imaging. Superficial Great Saphenous Vein: No evidence of thrombus. Normal compressibility and flow on color Doppler imaging. Venous Reflux:  None. Other Findings:  Subcutaneous peripheral edema present. LEFT LOWER EXTREMITY Common Femoral Vein: No evidence of thrombus. Normal compressibility, respiratory phasicity and response to augmentation. Saphenofemoral Junction: No evidence of thrombus. Normal compressibility and flow on color Doppler imaging. Profunda Femoral Vein: No evidence of thrombus. Normal compressibility and flow on color Doppler imaging. Femoral Vein: No evidence of thrombus. Normal compressibility, respiratory phasicity and response to augmentation. Popliteal Vein: No evidence of thrombus. Normal compressibility, respiratory phasicity and response to augmentation. Calf Veins: No evidence of thrombus. Normal compressibility and flow on color Doppler imaging. Superficial Great Saphenous Vein: No evidence of thrombus. Normal compressibility and flow on color Doppler imaging. Venous Reflux:  None. Other Findings:  Subcutaneous peripheral edema present. IMPRESSION: No significant occlusive DVT in either extremity. Electronically Signed   By: Jerilynn Mages.  Shick M.D.   On: 09/19/2015 11:50   US Venous Img Upper Uni Right  09/12/2015  CLINICAL DATA:  81 year old female with right breast and chest swelling on the right. She has a right-sided port catheter (recently placed by Interventional Radiology, Dr. Pascal Lux, on 08/26/2015). Underlying pancreatic neoplasm. EXAM: RIGHT UPPER EXTREMITY VENOUS DOPPLER ULTRASOUND TECHNIQUE: Gray-scale sonography with graded compression, as well as color Doppler and duplex ultrasound were performed to evaluate the upper extremity deep venous system from the level of the subclavian vein and including the  jugular, axillary, basilic, radial, ulnar and upper cephalic vein. Spectral Doppler was utilized to evaluate flow at rest and with distal augmentation maneuvers. COMPARISON:  None. FINDINGS: Contralateral Subclavian Vein: Respiratory phasicity is normal and symmetric with the symptomatic side. No evidence of thrombus. Normal compressibility. Internal Jugular Vein: No evidence of thrombus. Normal compressibility, respiratory phasicity and response to augmentation. Subclavian Vein: No evidence of thrombus. Normal compressibility, respiratory phasicity and response to augmentation. Axillary Vein: No evidence of thrombus. Normal compressibility, respiratory phasicity and response to augmentation. Cephalic Vein: Noncompressible and containing low-level internal echoes consistent with acute thrombus. No evidence of vascular flow on color Doppler imaging. Thrombus extends from the mid arm to the cephalic arch. Basilic Vein: No evidence of thrombus. Normal compressibility, respiratory phasicity and response to augmentation. Brachial Veins: No evidence of thrombus. Normal compressibility, respiratory phasicity and response to augmentation. Radial Veins: No evidence of thrombus. Normal compressibility, respiratory phasicity and response to augmentation. Ulnar Veins: No evidence of thrombus. Normal compressibility, respiratory phasicity and response to augmentation. Venous Reflux:  None visualized. Other Findings:  None visualized. IMPRESSION: 1. Positive for superficial thrombosis of the cephalic vein. 2. No evidence of deep venous thrombosis in the right upper extremity. Electronically Signed   By: Jacqulynn Cadet M.D.   On: 09/12/2015 12:35   US Aorta/ivc/iliacs [place]  09/19/2015  CLINICAL DATA:  81 year old female with lower extremity swelling EXAM: ULTRASOUND OF ABDOMINAL IVC TECHNIQUE: Ultrasound examination of the abdominal IVC and iliac veins was performed to evaluate for venous thrombotic disease. COMPARISON:   None. FINDINGS: Abdominal IVC The proximal and mid IVC are obscured by bowel gas with poor sonographic window. Visualized distal IVC demonstrates expected waveform, with no occlusion. Transmission of the waveform distally suggest no evidence of more proximal occlusion. Iliac veins: Bilateral iliac veins are patent. IMPRESSION: Sonographic survey of the IVC and iliac veins demonstrates patent distal IVC and bilateral iliac veins. Signed, Dulcy Fanny. Earleen Newport, DO [facility] Electronically Signed   By: Corrie Mckusick D.O.   On: 09/19/2015 14:33   Ir Fluoro Guide Cv Line Right  08/26/2015  INDICATION: History of pancreatic cancer. In need of durable intravenous access for chemotherapy administration. EXAM: IMPLANTED PORT A CATH PLACEMENT WITH ULTRASOUND AND FLUOROSCOPIC GUIDANCE COMPARISON:  None. MEDICATIONS: Vancomycin 1 gm IV; The antibiotic was administered within an appropriate time interval prior to skin puncture. ANESTHESIA/SEDATION: Versed 1 mg IV; Fentanyl 50 mcg IV; Total Moderate Sedation Time 15  minutes. CONTRAST:  None FLUOROSCOPY TIME:  24 seconds (2.8 mGy) COMPLICATIONS: None immediate PROCEDURE: The procedure, risks, benefits, and alternatives were explained to the patient. Questions regarding the procedure were encouraged and answered. The patient understands and consents to the procedure. The right neck and chest were prepped with chlorhexidine in a sterile fashion, and a sterile drape was applied covering the operative field. Maximum barrier sterile technique with sterile gowns and gloves were used for the procedure. A timeout was performed prior to the initiation of the procedure. Local anesthesia was provided with 1% lidocaine with epinephrine. After creating a small venotomy incision, a micropuncture kit was utilized to access the internal jugular vein under direct, real-time ultrasound guidance. Ultrasound image documentation was  performed. The microwire was kinked to measure appropriate catheter length. A subcutaneous port pocket was then created along the upper chest wall utilizing a combination of sharp and blunt dissection. The pocket was irrigated with sterile saline. A single lumen ISP power injectable port was chosen for placement. The 8 Fr catheter was tunneled from the port pocket site to the venotomy incision. The port was placed in the pocket. The external catheter was trimmed to appropriate length. At the venotomy, an 8 Fr peel-away sheath was placed over a guidewire under fluoroscopic guidance. The catheter was then placed through the sheath and the sheath was removed. Final catheter positioning was confirmed and documented with a fluoroscopic spot radiograph. The port was accessed with a Huber needle, aspirated and flushed with heparinized saline. The venotomy site was closed with an interrupted 4-0 Vicryl suture. The port pocket incision was closed with interrupted 2-0 Vicryl suture and the skin was opposed with a running subcuticular 4-0 Vicryl suture. Dermabond and Steri-strips were applied to both incisions. Dressings were placed. The patient tolerated the procedure well without immediate post procedural complication. FINDINGS: After catheter placement, the tip lies within the superior cavoatrial junction. The catheter aspirates and flushes normally and is ready for immediate use. IMPRESSION: Successful placement of a right internal jugular approach power injectable Port-A-Cath. The catheter is ready for immediate use. Electronically Signed   By: Sandi Mariscal M.D.   On: 08/26/2015 13:44   Ir US Guide Vasc Access Right  08/26/2015  INDICATION: History of pancreatic cancer. In need of durable intravenous access for chemotherapy administration. EXAM: IMPLANTED PORT A CATH PLACEMENT WITH  ULTRASOUND AND FLUOROSCOPIC GUIDANCE COMPARISON:  None. MEDICATIONS: Vancomycin 1 gm IV; The antibiotic was administered within an  appropriate time interval prior to skin puncture. ANESTHESIA/SEDATION: Versed 1 mg IV; Fentanyl 50 mcg IV; Total Moderate Sedation Time 15  minutes. CONTRAST:  None FLUOROSCOPY TIME:  24 seconds (2.8 mGy) COMPLICATIONS: None immediate PROCEDURE: The procedure, risks, benefits, and alternatives were explained to the patient. Questions regarding the procedure were encouraged and answered. The patient understands and consents to the procedure. The right neck and chest were prepped with chlorhexidine in a sterile fashion, and a sterile drape was applied covering the operative field. Maximum barrier sterile technique with sterile gowns and gloves were used for the procedure. A timeout was performed prior to the initiation of the procedure. Local anesthesia was provided with 1% lidocaine with epinephrine. After creating a small venotomy incision, a micropuncture kit was utilized to access the internal jugular vein under direct, real-time ultrasound guidance. Ultrasound image documentation was performed. The microwire was kinked to measure appropriate catheter length. A subcutaneous port pocket was then created along the upper chest wall utilizing a combination of sharp and blunt dissection. The pocket was irrigated with sterile saline. A single lumen ISP power injectable port was chosen for placement. The 8 Fr catheter was tunneled from the port pocket site to the venotomy incision. The port was placed in the pocket. The external catheter was trimmed to appropriate length. At the venotomy, an 8 Fr peel-away sheath was placed over a guidewire under fluoroscopic guidance. The catheter was then placed through the sheath and the sheath was removed. Final catheter positioning was confirmed and documented with a fluoroscopic spot radiograph. The port was accessed with a Huber needle, aspirated and flushed with heparinized saline. The venotomy site was closed with an interrupted 4-0 Vicryl suture. The port pocket incision was  closed with interrupted 2-0 Vicryl suture and the skin was opposed with a running subcuticular 4-0 Vicryl suture. Dermabond and Steri-strips were applied to both incisions. Dressings were placed. The patient tolerated the procedure well without immediate post procedural complication. FINDINGS: After catheter placement, the tip lies within the superior cavoatrial junction. The catheter aspirates and flushes normally and is ready for immediate use. IMPRESSION: Successful placement of a right internal jugular approach power injectable Port-A-Cath. The catheter is ready for immediate use. Electronically Signed   By: Sandi Mariscal M.D.   On: 08/26/2015 13:44    Microbiology: Recent Results (from the past 240 hour(s))  Culture, blood (routine x 2)     Status: None   Collection Time: 09/18/15 10:53 PM  Result Value Ref Range Status   Specimen Description BLOOD LEFT HAND  Final   Special Requests BOTTLES DRAWN AEROBIC AND ANAEROBIC [facility] EACH  Final   Culture NO GROWTH 5 DAYS  Final   Report Status 09/23/2015 FINAL  Final  Culture, blood (routine x 2)     Status: None   Collection Time: 09/18/15 10:56 PM  Result Value Ref Range Status   Specimen Description BLOOD LEFT HAND  Final   Special Requests BOTTLES DRAWN AEROBIC AND ANAEROBIC 4CC EACH  Final   Culture NO GROWTH 5 DAYS  Final   Report Status 09/23/2015 FINAL  Final     Labs: Basic Metabolic Panel:  Recent Labs Lab 09/19/15 0245 09/19/15 0939 09/20/15 0600 09/21/15 0619 09/22/15 0744 09/23/15 0656  NA 141  --  139 139 139 141  K 2.8*  --  3.8 3.8 3.6 3.7  CL 112*  --  114* 115* 113* 115*  CO2 18*  --  16* 21* 22 24  GLUCOSE 117*  --  124* 105* 95 75  BUN 26*  --  29* 27* 23* 22*  CREATININE 2.00*  --  2.05* 1.66* 1.34* 1.19*  CALCIUM 8.0*  --  7.6* 7.4* 7.3* 7.4*  MG  --  1.6*  --  1.6*  --   --    Liver Function Tests:  Recent Labs Lab 09/18/15 1555  AST 55*  ALT 34  ALKPHOS 91  BILITOT 1.0  PROT 4.9*  ALBUMIN 1.8*    No results for input(s): LIPASE, AMYLASE in the last 168 hours. No results for input(s): AMMONIA in the last 168 hours. CBC:  Recent Labs Lab 09/18/15 1555 09/19/15 0245 09/20/15 0600 09/21/15 0619 09/22/15 0744 09/23/15 0656  WBC 18.8* 20.5* 21.1* 16.4* 11.6* 10.4  NEUTROABS 15.9*  --   --   --   --   --   HGB 9.2* 9.6* 8.7* 8.5* 8.5* 8.0*  HCT 25.6* 27.2* 24.4* 23.7* 23.4* 22.5*  MCV 80.3 81.0 80.8 80.9 80.4 81.8  PLT 410* 419* 367 328 291 273   Cardiac Enzymes: No results for input(s): CKTOTAL, CKMB, CKMBINDEX, TROPONINI in the last 168 hours. BNP: BNP (last 3 results) No results for input(s): BNP in the last 8760 hours.  ProBNP (last 3 results) No results for input(s): PROBNP in the last 8760 hours.  CBG:  Recent Labs Lab 09/22/15 2016 09/23/15 0738 09/23/15 0810 09/23/15 0907 09/23/15 1129  GLUCAP 68 59* 66 92 130*       Signed:  Anelisse Jacobson  [facility] 09/23/2015, 1:22 PM

## 2015-09-23 NOTE — Progress Notes (Signed)
Physical Therapy Treatment Patient Details Name: Veronica Stevenson MRN: [id number] DOB: 1932-12-05 Today's Date: 09/23/2015    History of Present Illness Veronica Stevenson is an 81 y.o. 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 Unclear as to when she stopped, as she told me a few days ago, and to the RN yesterday. She was getting up, but fell, and she said it was due to the pain. She denied headache, back pain, fever, chills, coughs, abdominal pain, GI or GU symptoms. Evalaution in the ER included a marked leukocytosis of 18K, and elevated lactic acid of 2.9, normal TSH, negative UA for any infection. An MRI of the LS spine was obtained, and she has possible paraspinous edema, possibly related to prior radiation therapy, and severe spinal stenosis at L4,L5 with no evidence of metastatic disease. Hospitalist was asked to admit her for further work up.     PT Comments    Pt was up in a chair with no c/o this morning.  She continues to have significant edema in both LEs.  She tolerated therapeutic exercise to LEs well and was able to develop increased knee flexion to 90* bilaterally.  She was instructed in correct transfer technique and is now able to stand with mod assist.  Gait distance increased to 51' with a walker but gait is still very slow and labored.  She remains very pleasant and cooperative.  Follow Up Recommendations  SNF     Equipment Recommendations  Rolling walker with 5" wheels    Recommendations for Other Services  OT     Precautions / Restrictions Precautions Precautions: Fall Restrictions Weight Bearing Restrictions: No    Mobility  Bed Mobility                  Transfers Overall  transfer level: Needs assistance Equipment used: Rolling walker (2 wheeled) Transfers: Sit to/from Stand Sit to Stand: Mod assist         General transfer comment: knees are able to flex to 90* today after exercise...pt instructed in maximally flexing knees, increasing base of support and giving a forward thrust with UEs...  Ambulation/Gait Ambulation/Gait assistance: Min guard Ambulation Distance (Feet): 18 Feet Assistive device: Rolling walker (2 wheeled) Gait Pattern/deviations: Decreased stride length;Decreased dorsiflexion - right;Decreased dorsiflexion - left;Trunk flexed Gait velocity: gait is extremely slow and labored Gait velocity interpretation: <1.8 ft/sec, indicative of risk for recurrent falls General Gait Details: pt flexes trunk over walker as she tires and need cues to increase thoracic extension   Stairs            Wheelchair Mobility    Modified Rankin (Stroke Patients Only)       Balance Overall balance assessment: No apparent balance deficits (not formally assessed)                                  Cognition Arousal/Alertness: Awake/alert Behavior During Therapy: WFL for tasks assessed/performed Overall Cognitive Status: Within Functional Limits for tasks assessed                      Exercises General Exercises - Lower Extremity Ankle Circles/Pumps: AROM;Both;10 reps;Seated Quad Sets:  AROM;Both;10 reps;Seated Gluteal Sets: AROM;Both;10 reps;Seated Long Arc Quad: AROM;Both;10 reps;Seated Hip Flexion/Marching: AROM;Both;10 reps;Seated    General Comments        Pertinent Vitals/Pain Pain Assessment: No/denies pain    Home Living                      Prior Function            PT Goals (current goals can now be found in the care plan section) Progress towards PT goals: Progressing toward goals    Frequency  Min 3X/week    PT Plan Current plan remains appropriate    Co-evaluation              End of Session Equipment Utilized During Treatment: Gait belt Activity Tolerance: Patient tolerated treatment well Patient left: in chair;with call bell/phone within reach     Time: 1128-1204 PT Time Calculation (min) (ACUTE ONLY): 36 min  Charges:  $Gait Training: 8-22 mins $Therapeutic Exercise: 8-22 mins $Therapeutic Activity: 8-22 mins                    G CodesSable Feil  PT 09/23/2015, 12:14 PM [phone]

## 2015-09-23 NOTE — Clinical Social Work Placement (Addendum)
   CLINICAL SOCIAL WORK PLACEMENT  NOTE  Date:  09/23/2015  Patient Details  Name: Veronica Stevenson MRN: [id number] Date of Birth: 1933-06-06  Clinical Social Work is seeking post-discharge placement for this patient at the [place] level of care (*CSW will initial, date and re-position this form in  chart as items are completed):  Yes   Patient/family provided with [place] Work Department's list of facilities offering this level of care within the geographic area requested by the patient (or if unable, by the patient's family).  Yes   Patient/family informed of their freedom to choose among providers that offer the needed level of care, that participate in Medicare, Medicaid or managed care program needed by the patient, have an available bed and are willing to accept the patient.  Yes   Patient/family informed of [place]'s ownership interest in [facility] and [facility], as well as of the fact that they are under no obligation to receive care at these facilities.  PASRR submitted to EDS on 09/22/15     PASRR number received on 09/22/15     Existing PASRR number confirmed on       FL2 transmitted to all facilities in geographic area requested by pt/family on 09/22/15     FL2 transmitted to all facilities within larger geographic area on       Patient informed that his/her managed care company has contracts with or will negotiate with certain facilities, including the following:        Yes   Patient/family informed of bed offers received.  Patient chooses bed at [facility]     Physician recommends and patient chooses bed at      Patient to be transferred to [facility] on  .  Patient to be transferred to facility by       Patient family notified on   of transfer.  Name of family member notified:        PHYSICIAN       Additional Comment:  Facility starting authorization this  morning.  _______________________________________________ Salome Arnt, LCSW 09/23/2015, 9:49 AM [phone]

## 2015-09-23 NOTE — Clinical Social Work Placement (Signed)
   CLINICAL SOCIAL WORK PLACEMENT  NOTE  Date:  09/23/2015  Patient Details  Name: SANTANNA ZECHMAN MRN: [id number] Date of Birth: June 20, 1933  Clinical Social Work is seeking post-discharge placement for this patient at the [place] level of care (*CSW will initial, date and re-position this form in  chart as items are completed):  Yes   Patient/family provided with [place] Work Department's list of facilities offering this level of care within the geographic area requested by the patient (or if unable, by the patient's family).  Yes   Patient/family informed of their freedom to choose among providers that offer the needed level of care, that participate in Medicare, Medicaid or managed care program needed by the patient, have an available bed and are willing to accept the patient.  Yes   Patient/family informed of [place]'s ownership interest in [facility] and [facility], as well as of the fact that they are under no obligation to receive care at these facilities.  PASRR submitted to EDS on 09/22/15     PASRR number received on 09/22/15     Existing PASRR number confirmed on       FL2 transmitted to all facilities in geographic area requested by pt/family on 09/22/15     FL2 transmitted to all facilities within larger geographic area on       Patient informed that his/her managed care company has contracts with or will negotiate with certain facilities, including the following:        Yes   Patient/family informed of bed offers received.  Patient chooses bed at [facility]     Physician recommends and patient chooses bed at      Patient to be transferred to [facility] on 09/23/15.  Patient to be transferred to facility by staff     Patient family notified on 09/23/15 of transfer.  Name of family member notified:  Ulis Rias- sister      PHYSICIAN       Additional Comment:  Per [facility], authorization received.    _______________________________________________ Salome Arnt, [place] 09/23/2015, 3:33 PM [phone]

## 2015-09-23 NOTE — Progress Notes (Signed)
Inpatient Diabetes Program Recommendations  AACE/ADA: New Consensus Statement on Inpatient Glycemic Control (2015)  Target Ranges:  Prepandial:   less than 140 mg/dL      Peak postprandial:   less than 180 mg/dL (1-2 hours)      Critically ill patients:  140 - 180 mg/dL    Results for Veronica Stevenson, Veronica Stevenson (MRN [id number]) as of 09/23/2015 07:49  Ref. Range 09/22/2015 08:00 09/22/2015 11:50 09/22/2015 16:48 09/22/2015 20:16  Glucose-Capillary Latest Ref Range: 65-99 mg/dL 85 108 (H) 122 (H) 68    Results for Veronica Stevenson, Veronica Stevenson (MRN [id number]) as of 09/23/2015 07:49  Ref. Range 09/23/2015 07:38  Glucose-Capillary Latest Ref Range: 65-99 mg/dL 59 (L)    Admit with: LE Edema  History: DM, Pancreatic Cancer s/p Whipple, CKD  Home DM Meds: Metformin per Dr. Maralyn Sago note  Current Insulin Orders: Novolog Sensitive SSI (0-9 units) TID AC + HS     MD- Note patient received 1 unit Novolog SSI yesterday (12/12) at 5pm for CBG of 122 mg/dl.  CBG by 8pm was down to 68 mg/dl.  Patient again with Hypoglycemia this AM (CBG 59 mg/dl this AM).  Please consider discontinuing Novolog Sensitive SSI for now.  CBGs have been well controlled without Novolog.     --Will follow patient during hospitalization--  Wyn Quaker RN, MSN, CDE Diabetes Coordinator Inpatient Glycemic Control Team Team Pager: [phone] (8a-5p)

## 2015-09-23 NOTE — Care Management Important Message (Signed)
Important Message  Patient Details  Name: Veronica Stevenson MRN: [id number] Date of Birth: May 12, 1933   Medicare Important Message Given:  Yes    Sherald Barge, RN 09/23/2015, 10:53 AM

## 2015-09-23 NOTE — Progress Notes (Signed)
Report called to the [facility]. 

## 2015-09-24 ENCOUNTER — Non-Acute Institutional Stay (SKILLED_NURSING_FACILITY): Payer: Medicare Other | Admitting: Internal Medicine

## 2015-09-24 ENCOUNTER — Other Ambulatory Visit (HOSPITAL_COMMUNITY)
Admission: AD | Admit: 2015-09-24 | Discharge: 2015-09-24 | Disposition: A | Discharge status: Home / Self Care | Payer: Medicare Other | Source: Skilled Nursing Facility | Attending: Internal Medicine | Admitting: Internal Medicine

## 2015-09-24 DIAGNOSIS — R601 Generalized edema: Secondary | ICD-10-CM | POA: Diagnosis not present

## 2015-09-24 DIAGNOSIS — E8809 Other disorders of plasma-protein metabolism, not elsewhere classified: Secondary | ICD-10-CM | POA: Diagnosis not present

## 2015-09-24 DIAGNOSIS — E11649 Type 2 diabetes mellitus with hypoglycemia without coma: Secondary | ICD-10-CM

## 2015-09-24 DIAGNOSIS — E0822 Diabetes mellitus due to underlying condition with diabetic chronic kidney disease: Secondary | ICD-10-CM | POA: Diagnosis not present

## 2015-09-24 DIAGNOSIS — N183 Chronic kidney disease, stage 3 unspecified: Secondary | ICD-10-CM

## 2015-09-24 DIAGNOSIS — N390 Urinary tract infection, site not specified: Secondary | ICD-10-CM | POA: Diagnosis present

## 2015-09-24 LAB — PROTEIN / CREATININE RATIO, URINE
Creatinine, Urine: 241.72 mg/dL
PROTEIN CREATININE RATIO: 0.12 mg/mg{creat} (ref 0.00–0.15)
Total Protein, Urine: 29 mg/dL

## 2015-09-24 NOTE — Progress Notes (Signed)
Patient ID: Veronica Stevenson, female   DOB: 1933/06/06, 81 y.o.   MRN: [id number]    Facility; [facility] Chief complaint; admission to SNF post admit to [facility] from 12/8 to 09/23/2015. He has a very bottle discharge summary from Dr. Caryn Section  History;  this is a medically complex patient who had a Whipple's procedure in August at [facility]. She received chemotherapy with gemcitabine. She had been diagnosed with a right upper extremity DVT started on Xarelto or she did not tolerate this and apparently had stopped it to. She came in the hospital with a chief complaint of bilateral lower extremity swelling and pain. Duplex ultrasounds bilaterally were negative for a DVT. Ultrasound survey of the IVC and iliac veins revealed patency with no evidence of occlusion or external compression MRI of the L-spine revealed severe multifactorial spinal stenosis with paraspinal edema possibly related to radiation. The patient was treated with IV Lasix but I think was felt to be prerenal. Oral Lasix was started. She had an echocardiogram that showed an EF of 60-65% with grade 1 diastolic dysfunction. There was a feeling that the GEN Cytovene could be a cause of this also recent dexamethasone. The gemcitabine was discontinued. She also had a metabolic acidosis on presentation with a CO2 of 16 treated with bicarbonate. Leukocytosis was over 20,000 but there was no evidence of infection and this was felt secondary to steroids. All cultures have remained negative. I note that she had a C. difficile toxin assay in October that was also negative. The patient is apparently felt to be a diabetic and was on metformin although the patient tells me this was stopped prior to admission due to diarrhea. She has not been on insulin. A Whipple's procedure should've only remove the pancreatic head. She was not on insulin after the surgery  The patient was found to have profound hypoglycemia this morning. She was treated  very well by the nurse on nights that the building with glucagon and IV dextrose. She is come around nicely. This will need to be monitored carefully over the course of today. I wonder whether she really needs Lantus.  As she did not seem to tolerate Xarelto she is been discharged on Lovenox. This was a cephalic vein DVT diagnosed on 12/2. The Xarelto issues were nausea and weakness. She was therefore heparinized and switched to Lovenox. The feeling was that this should be treated for 3-6 months. Her oncologist is Dr. Whitney Muse. At this point I don't know of any reason why this would need to be treated on a prolonged basis. She was also felt to have renal insufficiency with a creatinine of 2.01 her losartan was discontinued and she received volume. Her discharge creatinine was 1.34. Also noted hypokalemia and hypomagnesemia which were replaced  BMP Latest Ref Rng 09/23/2015 09/22/2015 09/21/2015  Glucose 65 - 99 mg/dL 75 95 105(H)  BUN 6 - 20 mg/dL 22(H) 23(H) 27(H)  Creatinine 0.44 - 1.00 mg/dL 1.19(H) 1.34(H) 1.66(H)  Sodium 135 - 145 mmol/L 141 139 139  Potassium 3.5 - 5.1 mmol/L 3.7 3.6 3.8  Chloride 101 - 111 mmol/L 115(H) 113(H) 115(H)  CO2 22 - 32 mmol/L 24 22 21(L)  Calcium 8.9 - 10.3 mg/dL 7.4(L) 7.3(L) 7.4(L)   CBC Latest Ref Rng 09/23/2015 09/22/2015 09/21/2015  WBC 4.0 - 10.5 K/uL 10.4 11.6(H) 16.4(H)  Hemoglobin 12.0 - 15.0 g/dL 8.0(L) 8.5(L) 8.5(L)  Hematocrit 36.0 - 46.0 % 22.5(L) 23.4(L) 23.7(L)  Platelets 150 - 400 K/uL  42 291 328    Past Medical History  Diagnosis Date  . Essential hypertension   . Hyperlipidemia   . Asthmatic bronchitis   . Chronic pancreatitis ([place])   . Osteoarthritis of knee   . Atypical ductal hyperplasia of right breast   . Vitamin D deficiency   . GERD (gastroesophageal reflux disease)   . Hyperparathyroidism ([place])   . Zollinger-Ellison syndrome   . Carotid artery disease ([place])   . Cancer ([place])   . Spinal stenosis of lumbar region 09/20/2015     Past Surgical History  Procedure Laterality Date  . Colonoscopy  2009    [place]  . Tonsillectomy    . Cataract extraction, bilateral Bilateral   . Parathyroidectomy    . Ovarian cyst removal    . Eus N/A 04/17/2015    Jacobs-1.7 cm x 2.5 cm mass in head of pancreas causing dilation of pancreatic duct and common bile duct.  . Ercp N/A 04/23/2015    [id number], heterogeneous,indistinctly bordered mass in head of pancreas  . Sphincterotomy N/A 04/23/2015    Procedure: SPHINCTEROTOMY;  Surgeon: Daneil Dolin, MD;  Location: [facility];  Service: Endoscopy;  Laterality: N/A;  . Biliary stent placement N/A 04/23/2015    Procedure: BILIARY STENT PLACEMENT;  Surgeon: Daneil Dolin, MD;  Location: [facility];  Service: Endoscopy;  Laterality: N/A;  . Whipple procedure  August 15th 2016   Current Outpatient Prescriptions on File Prior to Visit  Medication Sig Dispense Refill  . acetaminophen (TYLENOL) 500 MG tablet Take 500 mg by mouth every 6 (six) hours as needed for mild pain or moderate pain.    Marland Kitchen atorvastatin (LIPITOR) 10 MG tablet Take 10 mg by mouth daily after lunch.     . Cholecalciferol (VITAMIN D) 2000 UNITS CAPS Take 1 capsule by mouth every morning.     . enoxaparin (LOVENOX) 60 MG/0.6ML injection Inject 0.6 mLs (60 mg total) into the skin daily. TREATMENT REGIMEN 3-6 MONTHS. 0 Syringe   . furosemide (LASIX) 20 MG tablet TAKE 2 TABLETS DAILY FOR 2 MORE DAYS AND THEN 1 TABLET DAILY THEREAFTER FOR LOWER EXTREMITY EDEMA.    . insulin glargine (LANTUS) 100 UNIT/ML injection Inject 0.1 mLs (10 Units total) into the skin at bedtime.    . lidocaine-prilocaine (EMLA) cream Apply 1 application topically as needed. 30 g 3  . loperamide (IMODIUM A-D) 2 MG tablet Take 2 mg by mouth as needed for diarrhea or loose stools.    . metoCLOPramide (REGLAN) 10 MG tablet Take 1 tablet (10 mg total) by mouth 2 (two) times daily as needed for nausea. And at night 30 tablet 0  . metoprolol succinate  (TOPROL-XL) 25 MG 24 hr tablet Take 25 mg by mouth every evening.     Marland Kitchen oxyCODONE (OXY IR/ROXICODONE) 5 MG immediate release tablet Take 1 tablet (5 mg total) by mouth every 6 (six) hours as needed. 45 tablet 0  . potassium chloride (K-DUR) 10 MEQ tablet Take 1 tablet (10 mEq total) by mouth 2 (two) times daily.         Social; patient tells me she lives on her own and Milus Glazier but more recently has been living with her sister in [place] over the last 3-4 weeks. I am not clear about her functional status at this point. She is not been on insulin  reports that she has never smoked. She has never used smokeless tobacco. She reports that she does not drink alcohol or use illicit drugs.  Fam: indicated that her mother is deceased. She indicated that her father is deceased.   Review of systems Gen. patient states she feels a lot better after being hypoglycemic this morning. She is now awake and alert HEENT no oral no oral pain or swallowing difficulties Respiratory no shortness of breath Cardiac no chest pain GI she still describes diarrhea but no abdominal pain GU no dysuria Musculoskeletal no obvious joint pain Endocrine; assures me that she was not on insulin after her pancreatic surgery Neurologic; neurologic weakness but no altered sensation in her legs. It is not clear to me when she was last walking Mental status no suggestions of depressive symptoms  Physical examination Gen. the patient is now awake alert and able to give her own history Vitals; O2 sat 96% on room air pulse rate 72 respirations 18 HEENT; no scleral icterus is noted oral exam is normal Lymph nonpalpable in the cervical clavicular or axillary areas Respiratory; right lung is clear is bronchial breathing and crackles over the left lower lobe work of breathing is normal Cardiac heart sounds are normal JVP is not elevated there is no S3 Abdomen; there is edema which is grossly pitting to both flanks up to her rib  cage. There is no liver spleen it is obviously palpable GU bladder not distended no CVA tenderness. Extremities; 2-3+ edema continuously up into her groin and in the flanks of her abdomen. There is no tenderness no warmth Musculoskeletal; no active joints Neurologic; this patient has barely antigravity strength in her hip flexors reflexes are symmetric at the knees there is no sensory level Mental status after treatment for her hypoglycemia she is alert conversational I see no issues here  Impression/plan #1 severe hypoglycemia this morning requiring IV by IV D5W, glucagon. She is now much better. I wonder whether she needs the Lantus or indeed any treatment for her diabetes at this point. I'm going to monitor her blood sugars very carefully this morning. We'll also monitor her for signs of acidosis given her recent presumably partial pancreatectomy #2 severe edema the cause of which was not really determine. She had a very nice workup for this including imaging of her IVC and major iliac veins that did not show intravascular or extravascular issues. Would wonder about protein losing enteropathy contributing and I'll screen her for this. Her albumin in the hospital was 1.8-2. At this point I am not going to push her diuretics the on the 40 mg she is getting but I'm not going to reduce it either. Monitor her vascular and electrolyte status #3 diarrhea; one would wonder whether this woman is now absorbing and will need pancreatic enzyme replacement. She had a single C. difficile that was negative in October #4 hypokalemia and hypomagnesemia will be checked #5 hyperlipidemia on Lipitor #6 right arm central DVT on Lovenox I'll continue to 60 mg once a day as ordered is no signs of an issue here currently #7 profound lower extremity weakness; there are no other worrisome signs on exam. This could be all disuse, steroid myopathy #8 no longer on steroids, I am not certain of the duration she was on this,  this will need to be monitored #9 chronic renal failure stage III ??? Diabetic, nephrosclerosis etc. This could be easily contributing to the massive edema  For today the issue will be monitoring her CBGs. She did receive Lantus last night 10 units. This is going to be held. I am also going to monitor her acid base status  and her "diarrhea"

## 2015-09-25 ENCOUNTER — Encounter (HOSPITAL_COMMUNITY)
Admission: AD | Admit: 2015-09-25 | Discharge: 2015-09-25 | Disposition: A | Discharge status: Home / Self Care | Payer: Medicare Other | Source: Skilled Nursing Facility | Attending: Internal Medicine | Admitting: Internal Medicine

## 2015-09-25 ENCOUNTER — Ambulatory Visit (HOSPITAL_COMMUNITY): Payer: Medicare Other | Admitting: Oncology

## 2015-09-25 LAB — BASIC METABOLIC PANEL
ANION GAP: 6 (ref 5–15)
BUN: 20 mg/dL (ref 6–20)
CALCIUM: 7.6 mg/dL — AB (ref 8.9–10.3)
CO2: 22 mmol/L (ref 22–32)
Chloride: 108 mmol/L (ref 101–111)
Creatinine, Ser: 1.41 mg/dL — ABNORMAL HIGH (ref 0.44–1.00)
GFR calc Af Amer: 40 mL/min — ABNORMAL LOW (ref >60)
GFR, EST NON AFRICAN AMERICAN: 34 mL/min — AB (ref >60)
Glucose, Bld: 134 mg/dL — ABNORMAL HIGH (ref 65–99)
POTASSIUM: 3.9 mmol/L (ref 3.5–5.1)
Sodium: 136 mmol/L (ref 135–145)

## 2015-09-25 LAB — MAGNESIUM: MAGNESIUM: 1.6 mg/dL — AB (ref 1.7–2.4)

## 2015-09-26 ENCOUNTER — Inpatient Hospital Stay (HOSPITAL_COMMUNITY): Payer: Medicare Other

## 2015-09-26 ENCOUNTER — Non-Acute Institutional Stay (SKILLED_NURSING_FACILITY): Payer: Medicare Other | Admitting: Internal Medicine

## 2015-09-26 ENCOUNTER — Ambulatory Visit (HOSPITAL_COMMUNITY): Payer: Medicare Other | Admitting: Hematology & Oncology

## 2015-09-26 DIAGNOSIS — E118 Type 2 diabetes mellitus with unspecified complications: Secondary | ICD-10-CM

## 2015-09-26 DIAGNOSIS — R609 Edema, unspecified: Secondary | ICD-10-CM | POA: Insufficient documentation

## 2015-09-26 DIAGNOSIS — R601 Generalized edema: Secondary | ICD-10-CM | POA: Diagnosis not present

## 2015-09-26 DIAGNOSIS — N182 Chronic kidney disease, stage 2 (mild): Secondary | ICD-10-CM | POA: Diagnosis not present

## 2015-09-26 NOTE — Progress Notes (Signed)
Patient ID: Veronica Stevenson, female   DOB: April 01, 1933, 81 y.o.   MRN: [id number]       Facility; [facility] This is an acute visit.  Chief complaint-acute visit follow-up hypoglycemia   HPI  this is a medically complex patient who had a Whipple's procedure in August at [facility]. She received chemotherapy with gemcitabine. She had been diagnosed with a right upper extremity DVT started on Xarelto or she did not tolerate this and apparently had stopped it to. She came in the hospital with a chief complaint of bilateral lower extremity swelling and pain. Duplex ultrasounds bilaterally were negative for a DVT. Ultrasound survey of the IVC and iliac veins revealed patency with no evidence of occlusion or external compression MRI of the L-spine revealed severe multifactorial spinal stenosis with paraspinal edema possibly related to radiation. The patient was treated with IV Lasix but I think was felt to be prerenal. Oral Lasix was started. She had an echocardiogram that showed an EF of 60-65% with grade 1 diastolic dysfunction. There was a feeling that the GEN Cytovene could be a cause of this also recent dexamethasone. The gemcitabine was discontinued. She also had a metabolic acidosis on presentation with a CO2 of 16 treated with bicarbonate. Leukocytosis was over 20,000 but there was no evidence of infection and this was felt secondary to steroids. All cultures have remained negative.  note that she had a C. difficile toxin assay in October that was also negative. The patient is apparently felt to be a diabetic and was on metformin although the patient says this  was stopped prior to admission due to diarrhea. She has not been on insulin. A Whipple's procedure should've only remove the pancreatic head. She was not on insulin after the surgery  The patient was found to have profound hypoglycemia her first morning in facillity... She was treated very well by the nurse on nights in  the  building with glucagon and IV dextrose. She has come around nicely. This was monitored closely the last couple days and has stabilized-she did receive continued IV Dextrose  initially after Dr. Dellia Nims saw her--  the however over the last day or so this appears to have stabilized significantly she is no longer receiving Lantus-recent blood sugars 103 this morning-1 33-1 13-1 49 during the course the day Today she is bright alert does not have any acute complaints other than feeling weak.  In regards to the DVT-- she did not seem to tolerate Xarelto she is been discharged on Lovenox. This was a cephalic vein DVT diagnosed on 12/2. The Xarelto issues were nausea and weakness. She was therefore heparinized and switched to Lovenox. The feeling was that this should be treated for 3-6 months.  . She was also felt to have renal insufficiency with a creatinine of 2.01 her losartan was discontinued and she received volume. Her discharge creatinine was 1.34. Also noted hypokalemia and hypomagnesemia which were replaced  Creatinine on December 15 was 1.41  BMP Latest Ref Rng 09/23/2015 09/22/2015 09/21/2015  Glucose 65 - 99 mg/dL 75 95 105(H)  BUN 6 - 20 mg/dL 22(H) 23(H) 27(H)  Creatinine 0.44 - 1.00 mg/dL 1.19(H) 1.34(H) 1.66(H)  Sodium 135 - 145 mmol/L 141 139 139  Potassium 3.5 - 5.1 mmol/L 3.7 3.6 3.8  Chloride 101 - 111 mmol/L 115(H) 113(H) 115(H)  CO2 22 - 32 mmol/L 24 22 21(L)  Calcium 8.9 - 10.3 mg/dL 7.4(L) 7.3(L) 7.4(L)   CBC Latest Ref Rng  09/23/2015 09/22/2015 09/21/2015  WBC 4.0 - 10.5 K/uL 10.4 11.6(H) 16.4(H)  Hemoglobin 12.0 - 15.0 g/dL 8.0(L) 8.5(L) 8.5(L)  Hematocrit 36.0 - 46.0 % 22.5(L) 23.4(L) 23.7(L)  Platelets 150 - 400 K/uL 273 291 328    Past Medical History  Diagnosis Date  . Essential hypertension   . Hyperlipidemia   . Asthmatic bronchitis   . Chronic pancreatitis ([place])   . Osteoarthritis of knee   . Atypical ductal hyperplasia of right breast   . Vitamin D  deficiency   . GERD (gastroesophageal reflux disease)   . Hyperparathyroidism ([place])   . Zollinger-Ellison syndrome   . Carotid artery disease ([place])   . Cancer ([place])   . Spinal stenosis of lumbar region 09/20/2015   Past Surgical History  Procedure Laterality Date  . Colonoscopy  2009    [place]  . Tonsillectomy    . Cataract extraction, bilateral Bilateral   . Parathyroidectomy    . Ovarian cyst removal    . Eus N/A 04/17/2015    Jacobs-1.7 cm x 2.5 cm mass in head of pancreas causing dilation of pancreatic duct and common bile duct.  . Ercp N/A 04/23/2015    [id number], heterogeneous,indistinctly bordered mass in head of pancreas  . Sphincterotomy N/A 04/23/2015    Procedure: SPHINCTEROTOMY;  Surgeon: Daneil Dolin, MD;  Location: [facility];  Service: Endoscopy;  Laterality: N/A;  . Biliary stent placement N/A 04/23/2015    Procedure: BILIARY STENT PLACEMENT;  Surgeon: Daneil Dolin, MD;  Location: [facility];  Service: Endoscopy;  Laterality: N/A;  . Whipple procedure  August 15th 2016   Current Outpatient Prescriptions on File Prior to Visit  Medication Sig Dispense Refill  . acetaminophen (TYLENOL) 500 MG tablet Take 500 mg by mouth every 6 (six) hours as needed for mild pain or moderate pain.    Marland Kitchen atorvastatin (LIPITOR) 10 MG tablet Take 10 mg by mouth daily after lunch.     . Cholecalciferol (VITAMIN D) 2000 UNITS CAPS Take 1 capsule by mouth every morning.     . enoxaparin (LOVENOX) 60 MG/0.6ML injection Inject 0.6 mLs (60 mg total) into the skin daily. TREATMENT REGIMEN 3-6 MONTHS. 0 Syringe   . furosemide (LASIX) 20 MG tablet TAKE 2 TABLETS DAILY FOR 2 MORE DAYS AND THEN 1 TABLET DAILY THEREAFTER FOR LOWER EXTREMITY EDEMA.    . insulin glargine (LANTUS) 100 UNIT/ML injection Inject 0.1 mLs (10 Units total) into the skin at bedtime.    . lidocaine-prilocaine (EMLA) cream Apply 1 application topically as needed. 30 g 3  . loperamide (IMODIUM A-D) 2 MG tablet Take 2 mg by mouth  as needed for diarrhea or loose stools.    . metoCLOPramide (REGLAN) 10 MG tablet Take 1 tablet (10 mg total) by mouth 2 (two) times daily as needed for nausea. And at night 30 tablet 0  . metoprolol succinate (TOPROL-XL) 25 MG 24 hr tablet Take 25 mg by mouth every evening.     Marland Kitchen oxyCODONE (OXY IR/ROXICODONE) 5 MG immediate release tablet Take 1 tablet (5 mg total) by mouth every 6 (six) hours as needed. 45 tablet 0  . potassium chloride (K-DUR) 10 MEQ tablet Take 1 tablet (10 mEq total) by mouth 2 (two) times daily.         Social; patient tells me she lives on her own and Milus Glazier but more recently has been living with her sister in [place] over the last 3-4 weeks. I am not clear about her functional  status at this point. She is not been on insulin  reports that she has never smoked. She has never used smokeless tobacco. She reports that she does not drink alcohol or use illicit drugs.  Fam: indicated that her mother is deceased. She indicated that her father is deceased.   Review of systems Gen. patient states she continues to feel better. She is awake and alert HEENT no oral no oral pain or swallowing difficulties Respiratory no shortness of breath Cardiac no chest pain GIt no abdominal pain GU no dysuria Musculoskeletal no obvious joint pain Endocrine; assuree Dr. Dellia Nims that she was not on insulin after her pancreatic surgery Neurologic; neurologic weakness but no altered sensation in her legs. Mental status no suggestions of depressive symptoms or anxiety at this point  Physical examination  Temperature 98.4 pulse 97 respirations 20 blood pressure 158/75-123/88 most recently weight appears stable at 142.8 In general this is a pleasant elderly female in no distress resting comfortably in bed-she is awake alert HEENT; no scleral icterus is noted oral exam is norma Respiratory; clear to auscultation with shallow air entry- work of breathing is normal Cardiac heart sounds  are normal regular rate and rhythm without murmur gallop or rub Abdomen; there is edema which is grossly pitting to lower abdomen-per discussion with nursing this is fairly baseline with previous presentations  GU bladder not distended no CVA tenderness. Extremities; 2-3+ edema continuously up into her groin and into lower abdomen. There is no tenderness no warmth Musculoskeletal; no active joints Neurologic; this patient has barely antigravity strength in her hip flexors l Mental status  Continues to be alert and conversational  Labs.  09/25/2015.  Sodium 136 potassium 3.9E 120 creatinine 1.41.  Magnesium 1.6.  09/23/2015.  WBC 10.4 hemoglobin 8.0  Impression/plan #1  History of severe hypoglycemia-this appears to have resolved-a very fragile individual however-she has been getting blood sugars every 2 hours since this has now stabilized we will reduce this to every 4 hours secondary to patient comfort-continue to monitor her closely however She is no longer receiving Lantus  #2 severe edema the cause of which was not really determine. She had a very nice workup for this including imaging of her IVC and major iliac veins that did not show intravascular or extravascular issues. Would wonder about protein losing enteropathy contributing --Dr. Dellia Nims is following up on this-at this point continues on Lasix 40 mg a day this appears to have stabilized edema wise--her weight appears stable--creatinine is on the higher end of her baseline this will have to be watched will update a metabolic panel on Monday, December 19--she is on potassium supplementation as well with a history of hypokalemia.  Do note her magnesium is 1.6 which is borderline low -- appears this was her level in the hospital as well --will await Dr. Janalyn Rouse input on any possible supplementation--she will need an update a metabolic panel as noted above for follow-up  Also will update a CBC-with a hemoglobin of 8 this  appears to be in the lower end of her baseline which recently has been 8.5-9.5  [id number]   [id number] --

## 2015-09-28 ENCOUNTER — Encounter: Payer: Self-pay | Admitting: Internal Medicine

## 2015-09-29 ENCOUNTER — Encounter (HOSPITAL_COMMUNITY)
Admission: RE | Admit: 2015-09-29 | Discharge: 2015-09-29 | Disposition: A | Discharge status: Home / Self Care | Payer: Self-pay | Source: Skilled Nursing Facility | Attending: Internal Medicine | Admitting: Internal Medicine

## 2015-09-29 ENCOUNTER — Non-Acute Institutional Stay (SKILLED_NURSING_FACILITY): Payer: Medicare Other | Admitting: Internal Medicine

## 2015-09-29 ENCOUNTER — Encounter (HOSPITAL_COMMUNITY)
Admission: RE | Admit: 2015-09-29 | Discharge: 2015-09-29 | Disposition: A | Discharge status: Home / Self Care | Payer: Medicare Other | Source: Skilled Nursing Facility | Attending: Internal Medicine | Admitting: Internal Medicine

## 2015-09-29 DIAGNOSIS — N183 Chronic kidney disease, stage 3 unspecified: Secondary | ICD-10-CM

## 2015-09-29 DIAGNOSIS — R601 Generalized edema: Secondary | ICD-10-CM

## 2015-09-29 LAB — BASIC METABOLIC PANEL
Anion gap: 7 (ref 5–15)
BUN: 15 mg/dL (ref 6–20)
CHLORIDE: 109 mmol/L (ref 101–111)
CO2: 22 mmol/L (ref 22–32)
CREATININE: 1.41 mg/dL — AB (ref 0.44–1.00)
Calcium: 8.1 mg/dL — ABNORMAL LOW (ref 8.9–10.3)
GFR calc non Af Amer: 34 mL/min — ABNORMAL LOW (ref >60)
GFR, EST AFRICAN AMERICAN: 40 mL/min — AB (ref >60)
Glucose, Bld: 147 mg/dL — ABNORMAL HIGH (ref 65–99)
POTASSIUM: 4.3 mmol/L (ref 3.5–5.1)
SODIUM: 138 mmol/L (ref 135–145)

## 2015-09-29 LAB — CBC WITH DIFFERENTIAL/PLATELET
Basophils Absolute: 0 10*3/uL (ref 0.0–0.1)
Basophils Relative: 0 %
EOS ABS: 0 10*3/uL (ref 0.0–0.7)
Eosinophils Relative: 0 %
HCT: 22.8 % — ABNORMAL LOW (ref 36.0–46.0)
HEMOGLOBIN: 8.1 g/dL — AB (ref 12.0–15.0)
LYMPHS ABS: 1.6 10*3/uL (ref 0.7–4.0)
LYMPHS PCT: 18 %
MCH: 30.1 pg (ref 26.0–34.0)
MCHC: 35.5 g/dL (ref 30.0–36.0)
MCV: 84.8 fL (ref 78.0–100.0)
Monocytes Absolute: 0.9 10*3/uL (ref 0.1–1.0)
Monocytes Relative: 10 %
NEUTROS PCT: 72 %
Neutro Abs: 6.4 10*3/uL (ref 1.7–7.7)
Platelets: 289 10*3/uL (ref 150–400)
RBC: 2.69 MIL/uL — AB (ref 3.87–5.11)
RDW: 19.3 % — ABNORMAL HIGH (ref 11.5–15.5)
WBC: 9 10*3/uL (ref 4.0–10.5)

## 2015-09-30 ENCOUNTER — Encounter (HOSPITAL_BASED_OUTPATIENT_CLINIC_OR_DEPARTMENT_OTHER): Payer: Medicare Other | Admitting: Oncology

## 2015-09-30 ENCOUNTER — Encounter (HOSPITAL_COMMUNITY): Payer: Self-pay | Admitting: Oncology

## 2015-09-30 VITALS — BP 122/55 | HR 92 | Temp 98.5°F | Resp 18 | Wt 141.6 lb

## 2015-09-30 DIAGNOSIS — C250 Malignant neoplasm of head of pancreas: Secondary | ICD-10-CM

## 2015-09-30 DIAGNOSIS — D649 Anemia, unspecified: Secondary | ICD-10-CM

## 2015-09-30 DIAGNOSIS — N183 Chronic kidney disease, stage 3 (moderate): Secondary | ICD-10-CM | POA: Diagnosis not present

## 2015-09-30 DIAGNOSIS — D631 Anemia in chronic kidney disease: Secondary | ICD-10-CM | POA: Diagnosis not present

## 2015-09-30 DIAGNOSIS — R0609 Other forms of dyspnea: Secondary | ICD-10-CM | POA: Diagnosis not present

## 2015-09-30 DIAGNOSIS — R05 Cough: Secondary | ICD-10-CM

## 2015-09-30 DIAGNOSIS — C259 Malignant neoplasm of pancreas, unspecified: Secondary | ICD-10-CM

## 2015-09-30 DIAGNOSIS — M7989 Other specified soft tissue disorders: Secondary | ICD-10-CM | POA: Diagnosis not present

## 2015-09-30 LAB — RETICULOCYTES
RBC.: 2.65 MIL/uL — AB (ref 3.87–5.11)
RETIC COUNT ABSOLUTE: 63.6 10*3/uL (ref 19.0–186.0)
Retic Ct Pct: 2.4 % (ref 0.4–3.1)

## 2015-09-30 LAB — FOLATE: Folate: 9.1 ng/mL (ref >5.9)

## 2015-09-30 MED ORDER — HEPARIN SOD (PORK) LOCK FLUSH 100 UNIT/ML IV SOLN
INTRAVENOUS | Status: AC
  Filled 2015-09-30: qty 5

## 2015-09-30 MED ORDER — SODIUM CHLORIDE 0.9 % IJ SOLN
10.0000 mL | INTRAMUSCULAR | Status: DC | PRN
  Administered 2015-09-30: 10 mL via INTRAVENOUS
  Filled 2015-09-30: qty 10

## 2015-09-30 MED ORDER — HEPARIN SOD (PORK) LOCK FLUSH 100 UNIT/ML IV SOLN
500.0000 [IU] | Freq: Once | INTRAVENOUS | Status: AC
  Administered 2015-09-30: 500 [IU] via INTRAVENOUS

## 2015-09-30 MED ORDER — AZITHROMYCIN 250 MG PO TABS: ORAL_TABLET | ORAL | Status: DC

## 2015-09-30 NOTE — Progress Notes (Signed)
[name], NP [address] [place] [address]  Malignant neoplasm of head of pancreas ([place]) - Plan: azithromycin (ZITHROMAX) 250 MG tablet  Anemia, unspecified anemia type - Plan: Vitamin B12, Folate, Iron and TIBC, Ferritin, Reticulocytes, Erythropoietin, Vitamin B12, Folate, Iron and TIBC, Ferritin, Reticulocytes, Erythropoietin  CURRENT THERAPY: Holding of adjuvant chemotherapy due to complications.  INTERVAL HISTORY: JUNKO LONGAKER 81 y.o. female returns for followup of Adenocarcinoma of the pancreas (T3N0M0) with positive ucinate margin, S/P whipple procedure by Dr. Eugenia Pancoast at [facility] on 06/05/2015.    Pancreatic cancer ([place])   04/17/2015 Tumor Marker Results for CHERELYN, HARBOLD (MRN [id number]) as of 07/25/2015 09:24  04/17/2015 08:57 CA 19-9: 701 (H)    04/18/2015 Imaging CT abd/pelvis- Severe intrahepatic and extrahepatic biliary ductal and pancreatic ductal dilatation is noted secondary to 3 cm pancreatic head mass consistent with malignancy.   04/23/2015 Pathology Results BILE DUCT BRUSHING (SPECIMEN 1 OF 1 COLLECTED 04/23/2015) NONDIAGNOSTIC MATERIAL.   04/23/2015 Procedure ERCP- Dr. Ardis Hughs   06/05/2015 Surgery Dr. Eugenia Pancoast at [facility] procedure demonstrating a T3N0 pancreatic mass with positive ucinate margin   07/18/2015 Tumor Marker Results for EADEN, WOHLGEMUTH (MRN [id number]) as of 07/25/2015 09:24  07/18/2015 08:52 CA 19-9: 49 (H)    08/15/2015 - 08/22/2015 Chemotherapy Gemcitabine single agent days 1, 8 every 28 days.   08/26/2015 Procedure Port placed by IR due to poor veinous access.   08/27/2015 Miscellaneous 2 unit PRBC transfusion   08/29/2015 Treatment Plan Change Deletion of day 15 of treatment due to low ANC on cycle 1 Day 15.   09/15/2015 Echocardiogram Left ventricle: The cavity size was normal. Wall thickness was normal. Systolic function was normal. The estimated ejection fraction was in the range of 60% to 65%. Wall motion was normal;  there were no regional wall motion abnormalities. Doppler   09/18/2015 - 09/23/2015 Hospital Admission Leg pain, swelling, and fall.  Discharged to [facility]   09/18/2015 Imaging MRI L-spine- Multilevel spondylosis as described with similar alignment to previous CT. There is severe multifactorial spinal stenosis at L4-5. 2. No acute osseous findings or evidence of metastatic disease. 3. Possible paraspinous edema superiorly, po   09/19/2015 Imaging [place] - Sonographic survey of the IVC and iliac veins demonstrates patent distal IVC and bilateral iliac veins.   09/19/2015 Imaging [place] B/L LE- No significant occlusive DVT in either extremity.    I personally reviewed and went over laboratory results with the patient.  The results are noted within this dictation.  Her anemia is noted.  Will update some labs today.  Depending on results, she may be a candidate for ESA therapy at renal dosing.  Chart is reviewed.  She continues with edema.  It has progressed to bilateral lower quadrant breast edema.    "They are about to kill me with physical therapy.  Can't they let me go to my sister's house and do physical therapy there?"  She notes that physical therapy at [facility] is working her hard.  "I can't do that stuff."  She is educated on the importance of physical therapy and the role they play in her care and recovery.  After explanation, she better understands this.  I have encouraged her to discuss her limitation with her physical therapist.  She notes a cough that is productive of green sputum.  She notes a scratchy throat as well.  This has been ongoing for a number of days  she reports.  She is educated on the grading of peripheral edema and the process to do that.  This helped explain why her healthcare providers at [facility] are "squeezing my thighs so hard!"  She is appreciative of the information and additionally more accepting of this practice as a result.  Past Medical  History  Diagnosis Date  . Essential hypertension   . Hyperlipidemia   . Asthmatic bronchitis   . Chronic pancreatitis ([place])   . Osteoarthritis of knee   . Atypical ductal hyperplasia of right breast   . Vitamin D deficiency   . GERD (gastroesophageal reflux disease)   . Hyperparathyroidism ([place])   . Zollinger-Ellison syndrome   . Carotid artery disease ([place])   . Cancer ([place])   . Spinal stenosis of lumbar region 09/20/2015    has Hypertension; Acute pancreatitis; HLD (hyperlipidemia); Essential hypertension; Hyperglycemia; Dyspepsia; Elevated lipase; Loss of weight; Abdominal pain; Pancreatic cancer ([place]); Leg swelling; CKD (chronic kidney disease), stage II; Coagulopathy ([place]); Anemia of chronic disease; Protein-calorie malnutrition, severe ([place]); Diabetes mellitus with complication ([place]); Spinal stenosis of lumbar region; Bilateral leg edema; and Edema on her problem list.     is allergic to codeine; omeprazole; and penicillins.  Current Facility-Administered Medications on File Prior to Visit  Medication Dose Route Frequency Provider Last Rate Last Dose  . sodium chloride 0.9 % injection 10 mL  10 mL Intravenous PRN Patrici Ranks, MD   10 mL at 08/29/15 0940   Current Outpatient Prescriptions on File Prior to Visit  Medication Sig Dispense Refill  . acetaminophen (TYLENOL) 500 MG tablet Take 500 mg by mouth every 6 (six) hours as needed for mild pain or moderate pain.    Marland Kitchen atorvastatin (LIPITOR) 10 MG tablet Take 10 mg by mouth daily after lunch.     . Cholecalciferol (VITAMIN D) 2000 UNITS CAPS Take 1 capsule by mouth every morning.     . enoxaparin (LOVENOX) 60 MG/0.6ML injection Inject 0.6 mLs (60 mg total) into the skin daily. TREATMENT REGIMEN 3-6 MONTHS. 0 Syringe   . furosemide (LASIX) 20 MG tablet TAKE 2 TABLETS DAILY FOR 2 MORE DAYS AND THEN 1 TABLET DAILY THEREAFTER FOR LOWER EXTREMITY EDEMA. (Patient taking differently: 40 mg daily. TAKE 2 TABLETS DAILY FOR 2 MORE  DAYS AND THEN 1 TABLET DAILY THEREAFTER FOR LOWER EXTREMITY EDEMA.)    . lidocaine-prilocaine (EMLA) cream Apply 1 application topically as needed. 30 g 3  . loperamide (IMODIUM A-D) 2 MG tablet Take 2 mg by mouth as needed for diarrhea or loose stools.    . metoCLOPramide (REGLAN) 10 MG tablet Take 1 tablet (10 mg total) by mouth 2 (two) times daily as needed for nausea. And at night 30 tablet 0  . metoprolol succinate (TOPROL-XL) 25 MG 24 hr tablet Take 25 mg by mouth every evening.     Marland Kitchen oxyCODONE (OXY IR/ROXICODONE) 5 MG immediate release tablet Take 1 tablet (5 mg total) by mouth every 6 (six) hours as needed. 45 tablet 0  . potassium chloride (K-DUR) 10 MEQ tablet Take 1 tablet (10 mEq total) by mouth 2 (two) times daily.    . insulin glargine (LANTUS) 100 UNIT/ML injection Inject 0.1 mLs (10 Units total) into the skin at bedtime.      Past Surgical History  Procedure Laterality Date  . Colonoscopy  2009    [place]  . Tonsillectomy    . Cataract extraction, bilateral Bilateral   . Parathyroidectomy    .  Ovarian cyst removal    . Eus N/A 04/17/2015    Jacobs-1.7 cm x 2.5 cm mass in head of pancreas causing dilation of pancreatic duct and common bile duct.  . Ercp N/A 04/23/2015    [id number], heterogeneous,indistinctly bordered mass in head of pancreas  . Sphincterotomy N/A 04/23/2015    Procedure: SPHINCTEROTOMY;  Surgeon: Daneil Dolin, MD;  Location: [facility];  Service: Endoscopy;  Laterality: N/A;  . Biliary stent placement N/A 04/23/2015    Procedure: BILIARY STENT PLACEMENT;  Surgeon: Daneil Dolin, MD;  Location: [facility];  Service: Endoscopy;  Laterality: N/A;  . Whipple procedure  August 15th 2016    Denies any headaches, dizziness, double vision, fevers, chills, night sweats, nausea, vomiting, diarrhea, constipation, chest pain, heart palpitations, shortness of breath, blood in stool, black tarry stool, urinary pain, urinary burning, urinary frequency, hematuria.     PHYSICAL EXAMINATION  ECOG PERFORMANCE STATUS: 3 - Symptomatic, >50% confined to bed  Filed Vitals:   09/30/15 1509  BP: 122/55  Pulse: 92  Temp: 98.5 F (36.9 C)  Resp: 18    GENERAL:alert, no distress, cooperative, smiling and unaccompanied and in a wheelchair. SKIN: no rashes or significant lesions HEAD: Normocephalic, No masses, lesions, tenderness or abnormalities EYES: normal, EOMI, Conjunctiva are pink and non-injected EARS: External ears normal OROPHARYNX:lips, buccal mucosa, and tongue normal, mucous membranes are moist and minimal posterior pharynx erythema.  NECK: supple, no adenopathy, trachea midline LYMPH:  no palpable lymphadenopathy BREAST:bilateral lower quadrant breast edema with woody infiltration LUNGS: clear to auscultation and percussion HEART: regular rate & rhythm ABDOMEN:abdomen soft, non-tender and normal bowel sounds BACK: Back symmetric, no curvature. EXTREMITIES:positive findings:  edema 2+ pitting edema in upper extremities bilaterally and 3+ pitting edema in lower extremities. NEURO: alert & oriented x 3 with fluent speech, no focal motor/sensory deficits    LABORATORY DATA: CBC    Component Value Date/Time   WBC 9.0 09/29/2015 1100   WBC 7.9 10/31/2014 1347   RBC 2.69* 09/29/2015 1100   RBC 4.17 10/31/2014 1347   HGB 8.1* 09/29/2015 1100   HGB 12.2 10/31/2014 1347   HCT 22.8* 09/29/2015 1100   HCT 38.4 10/31/2014 1347   PLT 289 09/29/2015 1100   PLT 218 10/31/2014 1347   MCV 84.8 09/29/2015 1100   MCV 92 10/31/2014 1347   MCH 30.1 09/29/2015 1100   MCH 29.2 10/31/2014 1347   MCHC 35.5 09/29/2015 1100   MCHC 31.8* 10/31/2014 1347   RDW 19.3* 09/29/2015 1100   RDW 13.8 10/31/2014 1347   LYMPHSABS 1.6 09/29/2015 1100   LYMPHSABS 2.4 10/31/2014 1347   MONOABS 0.9 09/29/2015 1100   MONOABS 0.6 10/31/2014 1347   EOSABS 0.0 09/29/2015 1100   EOSABS 0.1 10/31/2014 1347   BASOSABS 0.0 09/29/2015 1100   BASOSABS 0.0 10/31/2014 1347       Chemistry      Component Value Date/Time   NA 138 09/29/2015 1100   NA 142 10/31/2014 1347   K 4.3 09/29/2015 1100   K 3.5 10/31/2014 1347   CL 109 09/29/2015 1100   CL 108* 10/31/2014 1347   CO2 22 09/29/2015 1100   CO2 26 10/31/2014 1347   BUN 15 09/29/2015 1100   BUN 19* 10/31/2014 1347   CREATININE 1.41* 09/29/2015 1100   CREATININE 0.95 01/23/2015 1452   CREATININE 1.10 10/31/2014 1347      Component Value Date/Time   CALCIUM 8.1* 09/29/2015 1100   CALCIUM 9.3 10/31/2014 1347  ALKPHOS 91 09/18/2015 1555   ALKPHOS 88 10/31/2014 1347   AST 55* 09/18/2015 1555   AST 35 10/31/2014 1347   ALT 34 09/18/2015 1555   ALT 26 10/31/2014 1347   BILITOT 1.0 09/18/2015 1555   BILITOT 0.4 10/31/2014 1347        PENDING LABS:   RADIOGRAPHIC STUDIES:  Dg Chest 2 View  09/18/2015  CLINICAL DATA:  Increasing weakness.  Fall today, gastric cancer. EXAM: CHEST  2 VIEW COMPARISON:  CT chest 04/18/2015 and chest radiograph 03/26/2012. FINDINGS: Trachea is midline. Heart size normal. Right IJ power port tip is in the low SVC. Lungs are clear. No pleural fluid. Right hemidiaphragm is chronically elevated. Osseous structures appear grossly intact. IMPRESSION: No acute findings. Electronically Signed   By: Lorin Picket M.D.   On: 09/18/2015 17:20   Mr Lumbar Spine Wo Contrast  09/18/2015  CLINICAL DATA:  Bilateral leg weakness and back pain. History of pancreatic cancer. EXAM: MRI LUMBAR SPINE WITHOUT CONTRAST TECHNIQUE: Multiplanar, multisequence MR imaging of the lumbar spine was performed. No intravenous contrast was administered. COMPARISON:  Abdominal CT 04/18/2015.  Abdominal MRI 02/10/2015. FINDINGS: Scan quality is suboptimal. Segmentation: Conventional anatomy assumed, with the last open disc space designated L5-S1. Alignment: Stable from prior studies with mild convex left scoliosis and grade 1 anterolisthesis at L4-5. Bones: No worrisome osseous lesion, acute fracture or  pars defect. No evidence of osseous metastatic disease. There are chronic endplate degenerative changes at L4-5 and L5-S1. Conus medullaris: Extends to the L2 level and appears normal. Paraspinal and other soft tissues: The retroperitoneum is not well visualized by this examination. There is mild paraspinal edema near the thoracolumbar junction, possibly related to prior radiation therapy. There also appears to be dependent fluid within the subcutaneous fat dorsal to the erector spinae musculature, demonstrating fairly homogeneous high T2 and low T1 signal. No evidence of discitis. Disc levels: T12-L1: Mild disc bulging. No spinal stenosis or nerve root encroachment. L1-2:  Normal interspace. L2-3:  The disc appears normal.  Mild bilateral facet hypertrophy. L3-4: Annular disc bulging and moderate bilateral facet hypertrophy contribute to mild spinal stenosis with mild narrowing of the left lateral recess and both foramina. L4-5: There is severe multifactorial spinal stenosis secondary to advanced facet disease, the resulting grade 1 anterolisthesis and annular disc bulging. There is asymmetric narrowing of the right lateral recess and right foramen. L5-S1: Chronic degenerative disc disease with annular disc bulging and paraspinal osteophytes. Mild facet and ligamentous hypertrophy. There is mild spinal stenosis with mild narrowing of the lateral recesses and foramina bilaterally. IMPRESSION: 1. Multilevel spondylosis as described with similar alignment to previous CT. There is severe multifactorial spinal stenosis at L4-5. 2. No acute osseous findings or evidence of metastatic disease. 3. Possible paraspinous edema superiorly, possibly related to previous radiation therapy. Prominent subcutaneous fluid posteriorly in the back, possibly from similar etiology or stasis. Correlate clinically. Electronically Signed   By: Richardean Sale M.D.   On: 09/18/2015 18:35   US Venous Img Lower Bilateral  09/19/2015   CLINICAL DATA:  Bilateral lower extremity swelling, pain, recent fall EXAM: BILATERAL LOWER EXTREMITY VENOUS DOPPLER ULTRASOUND TECHNIQUE: Gray-scale sonography with graded compression, as well as color Doppler and duplex ultrasound were performed to evaluate the lower extremity deep venous systems from the level of the common femoral vein and including the common femoral, femoral, profunda femoral, popliteal and calf veins including the posterior tibial, peroneal and gastrocnemius veins when visible. The superficial great saphenous vein  was also interrogated. Spectral Doppler was utilized to evaluate flow at rest and with distal augmentation maneuvers in the common femoral, femoral and popliteal veins. COMPARISON:  None. FINDINGS: Limited exam because of peripheral edema. RIGHT LOWER EXTREMITY Common Femoral Vein: No evidence of thrombus. Normal compressibility, respiratory phasicity and response to augmentation. Saphenofemoral Junction: No evidence of thrombus. Normal compressibility and flow on color Doppler imaging. Profunda Femoral Vein: No evidence of thrombus. Normal compressibility and flow on color Doppler imaging. Femoral Vein: No evidence of thrombus. Normal compressibility, respiratory phasicity and response to augmentation. Popliteal Vein: No evidence of thrombus. Normal compressibility, respiratory phasicity and response to augmentation. Calf Veins: No evidence of thrombus. Normal compressibility and flow on color Doppler imaging. Superficial Great Saphenous Vein: No evidence of thrombus. Normal compressibility and flow on color Doppler imaging. Venous Reflux:  None. Other Findings:  Subcutaneous peripheral edema present. LEFT LOWER EXTREMITY Common Femoral Vein: No evidence of thrombus. Normal compressibility, respiratory phasicity and response to augmentation. Saphenofemoral Junction: No evidence of thrombus. Normal compressibility and flow on color Doppler imaging. Profunda Femoral Vein: No evidence  of thrombus. Normal compressibility and flow on color Doppler imaging. Femoral Vein: No evidence of thrombus. Normal compressibility, respiratory phasicity and response to augmentation. Popliteal Vein: No evidence of thrombus. Normal compressibility, respiratory phasicity and response to augmentation. Calf Veins: No evidence of thrombus. Normal compressibility and flow on color Doppler imaging. Superficial Great Saphenous Vein: No evidence of thrombus. Normal compressibility and flow on color Doppler imaging. Venous Reflux:  None. Other Findings:  Subcutaneous peripheral edema present. IMPRESSION: No significant occlusive DVT in either extremity. Electronically Signed   By: Jerilynn Mages.  Shick M.D.   On: 09/19/2015 11:50   US Venous Img Upper Uni Right  09/12/2015  CLINICAL DATA:  81 year old female with right breast and chest swelling on the right. She has a right-sided port catheter (recently placed by Interventional Radiology, Dr. Pascal Lux, on 08/26/2015). Underlying pancreatic neoplasm. EXAM: RIGHT UPPER EXTREMITY VENOUS DOPPLER ULTRASOUND TECHNIQUE: Gray-scale sonography with graded compression, as well as color Doppler and duplex ultrasound were performed to evaluate the upper extremity deep venous system from the level of the subclavian vein and including the jugular, axillary, basilic, radial, ulnar and upper cephalic vein. Spectral Doppler was utilized to evaluate flow at rest and with distal augmentation maneuvers. COMPARISON:  None. FINDINGS: Contralateral Subclavian Vein: Respiratory phasicity is normal and symmetric with the symptomatic side. No evidence of thrombus. Normal compressibility. Internal Jugular Vein: No evidence of thrombus. Normal compressibility, respiratory phasicity and response to augmentation. Subclavian Vein: No evidence of thrombus. Normal compressibility, respiratory phasicity and response to augmentation. Axillary Vein: No evidence of thrombus. Normal compressibility, respiratory phasicity  and response to augmentation. Cephalic Vein: Noncompressible and containing low-level internal echoes consistent with acute thrombus. No evidence of vascular flow on color Doppler imaging. Thrombus extends from the mid arm to the cephalic arch. Basilic Vein: No evidence of thrombus. Normal compressibility, respiratory phasicity and response to augmentation. Brachial Veins: No evidence of thrombus. Normal compressibility, respiratory phasicity and response to augmentation. Radial Veins: No evidence of thrombus. Normal compressibility, respiratory phasicity and response to augmentation. Ulnar Veins: No evidence of thrombus. Normal compressibility, respiratory phasicity and response to augmentation. Venous Reflux:  None visualized. Other Findings:  None visualized. IMPRESSION: 1. Positive for superficial thrombosis of the cephalic vein. 2. No evidence of deep venous thrombosis in the right upper extremity. Electronically Signed   By: Jacqulynn Cadet M.D.   On: 09/12/2015 12:35   US  Aorta/ivc/iliacs [place]  09/19/2015  CLINICAL DATA:  81 year old female with lower extremity swelling EXAM: ULTRASOUND OF ABDOMINAL IVC TECHNIQUE: Ultrasound examination of the abdominal IVC and iliac veins was performed to evaluate for venous thrombotic disease. COMPARISON:  None. FINDINGS: Abdominal IVC The proximal and mid IVC are obscured by bowel gas with poor sonographic window. Visualized distal IVC demonstrates expected waveform, with no occlusion. Transmission of the waveform distally suggest no evidence of more proximal occlusion. Iliac veins: Bilateral iliac veins are patent. IMPRESSION: Sonographic survey of the IVC and iliac veins demonstrates patent distal IVC and bilateral iliac veins. Signed, Dulcy Fanny. Earleen Newport, DO [facility] Radiology Electronically Signed   By: Corrie Mckusick D.O.   On: 09/19/2015 14:33     PATHOLOGY:    ASSESSMENT AND PLAN:  Pancreatic cancer  ([place]) Adenocarcinoma of the pancreas (T3N0M0) with positive ucinate margin, S/P whipple procedure by Dr. Eugenia Pancoast at [facility] on 06/05/2015.  She is S/P cycle 1 of gemcitabine single agent, last treated on [id number], complicated by significant edema without known cause.  Oncology history is updated.  Anemia is noted in the setting of Stage III chronic renal disease.  Her most recent HGB was ~ 8 g/dL on 09/29/2015.  Given her edema, will perform an anemia work-up with plans on treating anemia to help maintain appropriate osmotic pressure and help with edema treatment.  She may be a candidate for renal dosing of [name] based upon her lab findings.  Labs ordered: Anemia panel, retic count, EPO level.  She admits to a cough that is productive of green sputum.  She denies any known fevers.  She reports that it has been ongoing for a few days.  I have written an Rx for a Z-Pak.  This Rx is printed and provided with the patient's paperwork from [facility].  I am not sure what the protocol is for prescription prescribing for [facility].  Will recall patient and treat anemia based upon lab results.  She is currently on Lasix and therefore wearing an incontinence underwear.  Tentatively return appointment in 4 weeks.  We may intervene regarding her anemia once lab results are back.    THERAPY PLAN:  Treatment is on hold and it is questionable whether she will be a candidate for continued adjuvant therapy moving forward.  All questions were answered. The patient knows to call the clinic with any problems, questions or concerns. We can certainly see the patient much sooner if necessary.  Patient and plan discussed with Dr. Ancil Linsey and she is in agreement with the aforementioned.   This note is electronically signed by: Doy Mince 09/30/2015 4:37 PM

## 2015-09-30 NOTE — Assessment & Plan Note (Addendum)
Adenocarcinoma of the pancreas (T3N0M0) with positive ucinate margin, S/P whipple procedure by Dr. Eugenia Pancoast at [facility] on 06/05/2015.  She is S/P cycle 1 of gemcitabine single agent, last treated on [id number], complicated by significant edema without known cause.  Oncology history is updated.  Anemia is noted in the setting of Stage III chronic renal disease.  Her most recent HGB was ~ 8 g/dL on 09/29/2015.  Given her edema, will perform an anemia work-up with plans on treating anemia to help maintain appropriate osmotic pressure and help with edema treatment.  She may be a candidate for renal dosing of [name] based upon her lab findings.  Labs ordered: Anemia panel, retic count, EPO level.  She admits to a cough that is productive of green sputum.  She denies any known fevers.  She reports that it has been ongoing for a few days.  I have written an Rx for a Z-Pak.  This Rx is printed and provided with the patient's paperwork from [facility].  I am not sure what the protocol is for prescription prescribing for [facility].  Will recall patient and treat anemia based upon lab results.  She is currently on Lasix and therefore wearing an incontinence underwear.  Tentatively return appointment in 4 weeks.  We may intervene regarding her anemia once lab results are back.

## 2015-09-30 NOTE — Progress Notes (Signed)
Veronica Stevenson presented for labwork. Labs per MD order drawn via Heplock located in port in right upper chest Good blood return present. Procedure without incident.  Portacath flushed with 20ml NS and 500U/92ml Heparin per protocol and needle removed intact. Patient tolerated procedure well.  New port needle placed per protocol with good blood return and flushed with 10cc normal saline and converted to heparin lock.  Heparin 500 units instilled.

## 2015-09-30 NOTE — Patient Instructions (Signed)
[place] at [facility] Discharge Instructions  RECOMMENDATIONS MADE BY THE CONSULTANT AND ANY TEST RESULTS WILL BE SENT TO YOUR REFERRING PHYSICIAN.  Exam and discussion by Robynn Pane, PA-C Will start you on an antibiotic The lasix should help with the swelling in your breasts. Call with concerns or issues.  Follow-up in 4 weeks with Dr. Whitney Muse.  Thank you for choosing [place] at [facility] to provide your oncology and hematology care.  To afford each patient quality time with our provider, please arrive at least 15 minutes before your scheduled appointment time.    You need to re-schedule your appointment should you arrive 10 or more minutes late.  We strive to give you quality time with our providers, and arriving late affects you and other patients whose appointments are after yours.  Also, if you no show three or more times for appointments you may be dismissed from the clinic at the providers discretion.     Again, thank you for choosing [facility].  Our hope is that these requests will decrease the amount of time that you wait before being seen by our physicians.       _____________________________________________________________  Should you have questions after your visit to [facility], please contact our office at [phone] between the hours of 8:30 a.m. and 4:30 p.m.  Voicemails left after 4:30 p.m. will not be returned until the following business day.  For prescription refill requests, have your pharmacy contact our office.

## 2015-10-01 ENCOUNTER — Other Ambulatory Visit (HOSPITAL_COMMUNITY)
Admission: RE | Admit: 2015-10-01 | Discharge: 2015-10-01 | Disposition: A | Discharge status: Home / Self Care | Payer: Medicare Other | Source: Skilled Nursing Facility | Attending: Internal Medicine | Admitting: Internal Medicine

## 2015-10-01 ENCOUNTER — Non-Acute Institutional Stay (SKILLED_NURSING_FACILITY): Payer: Medicare Other | Admitting: Internal Medicine

## 2015-10-01 ENCOUNTER — Encounter (HOSPITAL_COMMUNITY)
Admission: RE | Admit: 2015-10-01 | Discharge: 2015-10-01 | Disposition: A | Discharge status: Home / Self Care | Payer: Self-pay | Source: Skilled Nursing Facility | Attending: Internal Medicine | Admitting: Internal Medicine

## 2015-10-01 ENCOUNTER — Other Ambulatory Visit: Payer: Self-pay

## 2015-10-01 DIAGNOSIS — R601 Generalized edema: Secondary | ICD-10-CM

## 2015-10-01 DIAGNOSIS — C250 Malignant neoplasm of head of pancreas: Secondary | ICD-10-CM

## 2015-10-01 DIAGNOSIS — N182 Chronic kidney disease, stage 2 (mild): Secondary | ICD-10-CM | POA: Diagnosis not present

## 2015-10-01 DIAGNOSIS — E8809 Other disorders of plasma-protein metabolism, not elsewhere classified: Secondary | ICD-10-CM | POA: Diagnosis not present

## 2015-10-01 LAB — COMPREHENSIVE METABOLIC PANEL
ALT: 41 U/L (ref 14–54)
AST: 45 U/L — ABNORMAL HIGH (ref 15–41)
Albumin: 1.6 g/dL — ABNORMAL LOW (ref 3.5–5.0)
Alkaline Phosphatase: 90 U/L (ref 38–126)
Anion gap: 4 — ABNORMAL LOW (ref 5–15)
BILIRUBIN TOTAL: 0.4 mg/dL (ref 0.3–1.2)
BUN: 14 mg/dL (ref 6–20)
CHLORIDE: 109 mmol/L (ref 101–111)
CO2: 24 mmol/L (ref 22–32)
CREATININE: 1.4 mg/dL — AB (ref 0.44–1.00)
Calcium: 7.8 mg/dL — ABNORMAL LOW (ref 8.9–10.3)
GFR calc Af Amer: 40 mL/min — ABNORMAL LOW (ref >60)
GFR, EST NON AFRICAN AMERICAN: 34 mL/min — AB (ref >60)
Glucose, Bld: 120 mg/dL — ABNORMAL HIGH (ref 65–99)
Potassium: 4 mmol/L (ref 3.5–5.1)
SODIUM: 137 mmol/L (ref 135–145)
TOTAL PROTEIN: 5 g/dL — AB (ref 6.5–8.1)

## 2015-10-01 LAB — PROTEIN / CREATININE RATIO, URINE: CREATININE, URINE: 36.64 mg/dL

## 2015-10-01 LAB — CK: CK TOTAL: 62 U/L (ref 38–234)

## 2015-10-01 LAB — ERYTHROPOIETIN: Erythropoietin: 14.9 m[IU]/mL (ref 2.6–18.5)

## 2015-10-01 MED ORDER — OXYCODONE HCL 5 MG PO TABS: 5.0000 mg | ORAL_TABLET | Freq: Four times a day (QID) | ORAL | Status: DC | PRN

## 2015-10-01 NOTE — Progress Notes (Signed)
Patient ID: Veronica Stevenson, female   DOB: [birth year], 81 y.o.   MRN: [id number]  Facility: [facility] Chief complaint; review of lower extremity edema History; this is a patient we actually took from the hospital and had a very thorough workup for this problem. She initially had a Whipple's procedure at [facility] in August the. She follows with oncology.She presented to the hospital with severe bilateral lower extremity edema.She had duplex ultrasounds bilaterally that were negative for DVT. Ultrasound of her abdomil IVC was normal for intrinsic or an extrinsic obstruction. Echocardiogram revealed grade 1 diastolic dysfunction with an EF of 60-65% Previous CT scans and MRIs of the abdomen do not suggest significant underlying liver disease. She has some degree of renal insufficiency however she is not spilling nephrotic range proteinuria her protein to creatinine ratio did not show nephrotic issues.I saw her on Monday and increased her Lasix to 40 twice a day. I have repeated her comprehensive metabolic panel to look at her BUN and creatinine and albumin. On December 6 her albumin level was 1.8 and this could be the major edema issue. I am not exactly sure of her oral intake.   BMP Latest Ref Rng 09/29/2015 09/25/2015 09/23/2015  Glucose 65 - 99 mg/dL 147(H) 134(H) 75  BUN 6 - 20 mg/dL 15 20 22(H)  Creatinine 0.44 - 1.00 mg/dL 1.41(H) 1.41(H) 1.19(H)  Sodium 135 - 145 mmol/L 138 136 141  Potassium 3.5 - 5.1 mmol/L 4.3 3.9 3.7  Chloride 101 - 111 mmol/L 109 108 115(H)  CO2 22 - 32 mmol/L 22 22 24   Calcium 8.9 - 10.3 mg/dL 8.1(L) 7.6(L) 7.4(L)    CBC Latest Ref Rng 09/29/2015 09/23/2015 09/22/2015  WBC 4.0 - 10.5 K/uL 9.0 10.4 11.6(H)  Hemoglobin 12.0 - 15.0 g/dL 8.1(L) 8.0(L) 8.5(L)  Hematocrit 36.0 - 46.0 % 22.8(L) 22.5(L) 23.4(L)  Platelets 150 - 400 K/uL 289 273 291    CMP Latest Ref Rng 10/01/2015 09/29/2015 09/25/2015  Glucose 65 - 99 mg/dL 120(H) 147(H) 134(H)  BUN 6 - 20 mg/dL 14 15 20    Creatinine 0.44 - 1.00 mg/dL 1.40(H) 1.41(H) 1.41(H)  Sodium 135 - 145 mmol/L 137 138 136  Potassium 3.5 - 5.1 mmol/L 4.0 4.3 3.9  Chloride 101 - 111 mmol/L 109 109 108  CO2 22 - 32 mmol/L 24 22 22   Calcium 8.9 - 10.3 mg/dL 7.8(L) 8.1(L) 7.6(L)  Total Protein 6.5 - 8.1 g/dL 5.0(L) - -  Total Bilirubin 0.3 - 1.2 mg/dL 0.4 - -  Alkaline Phos 38 - 126 U/L 90 - -  AST 15 - 41 U/L 45(H) - -  ALT 14 - 54 U/L 41 - -     Current medications Tylenol 500 every 6 hours when necessary Vitamin D3 2000 units once a day Lantus 10 units at bedtime Lipitor 10 daily Loperamide 2 mg once a day when necessary diarrhea lovenox60 subcutaneous once a day Oxycodone 5 mg every 6 hours when necessaryKCl 10 mEq twice a day Reglan10 mg twice a day Toprolol XL25 daily  Past Medical History  Diagnosis Date  . Essential hypertension   . Hyperlipidemia   . Asthmatic bronchitis   . Chronic pancreatitis ([place])   . Osteoarthritis of knee   . Atypical ductal hyperplasia of right breast   . Vitamin D deficiency   . GERD (gastroesophageal reflux disease)   . Hyperparathyroidism ([place])   . Zollinger-Ellison syndrome   . Carotid artery disease ([place])   . Cancer ([place])   . Spinal  stenosis of lumbar region 09/20/2015    Past Surgical History  Procedure Laterality Date  . Colonoscopy  2009    [place]  . Tonsillectomy    . Cataract extraction, bilateral Bilateral   . Parathyroidectomy    . Ovarian cyst removal    . Eus N/A 04/17/2015    Jacobs-1.7 cm x 2.5 cm mass in head of pancreas causing dilation of pancreatic duct and common bile duct.  . Ercp N/A 04/23/2015    [id number], heterogeneous,indistinctly bordered mass in head of pancreas  . Sphincterotomy N/A 04/23/2015    Procedure: SPHINCTEROTOMY;  Surgeon: Daneil Dolin, MD;  Location: [facility];  Service: Endoscopy;  Laterality: N/A;  . Biliary stent placement N/A 04/23/2015    Procedure: BILIARY STENT PLACEMENT;  Surgeon: Daneil Dolin, MD;   Location: [facility];  Service: Endoscopy;  Laterality: N/A;  . Whipple procedure  August 15th 2016    Review of systems Gen. Patient states she feels better.er weight today is142.8 pounds this is not changed since I increased the Lasix to 40 twice a day. Respiratory; no shortness of breath cardiacno chest pain GI no abdominal pain nausea vomiting GU no dysuria  Physical examination Gen. Patient looks to be in no distress Respiratory clear entry bilaterally Cardiac heart sounds are normal no S3 JVP is not elevated Abdomen; no liver no spleen no masses GUbladder not distended no CVA tenderness Extremities;she continues to have pitting edema up to her lower back.  Impression/plan #1pitting edema up to this lady's mid flank mid low back area.Other then fairly extreme hypoalbuminemia which I am rechecking today, the cause of this is not clear There is no evidence of a cardiac issue, nephrotic range proteinuria or underlying liver disease.she does have some degree of chronic renal insufficiency.Her albumin today is 1.6!!!!!!! #2 anemia hemoglobin of 8.1 most likely chronic disease #3; lower extremity weakness. I'll check a total CK.I think this may be the edema, disuse perhaps diabetic neuropathy #4diabetic hypoglycemia. She came here on Lantus insulin then had a severe hypoglycemic reactionand we have taken her off this.Her CBGs have been stable we will continue to monitor  The etiology of this lady's extreme hypoalbuminemiais not completely clear. If this is all nutritional this borders on starvation levels.

## 2015-10-01 NOTE — Telephone Encounter (Signed)
RX faxed to [facility] @ [phone]. Phone number [phone]  

## 2015-10-02 ENCOUNTER — Other Ambulatory Visit (HOSPITAL_COMMUNITY): Payer: Self-pay | Admitting: Oncology

## 2015-10-02 ENCOUNTER — Encounter (HOSPITAL_COMMUNITY): Payer: Self-pay | Admitting: Oncology

## 2015-10-05 NOTE — Progress Notes (Addendum)
Patient ID: Veronica Stevenson, female   DOB: 08-21-1933, 81 y.o.   MRN: [id number]                PROGRESS NOTE  DATE:  09/29/2015        FACILITY: [facility]                   LEVEL OF CARE:   SNF   Acute Visit                          CHIEF COMPLAINT:  Follow up medical issues including edema, hypoglycemia.       HISTORY OF PRESENT ILLNESS:  This is a medically complex lady who had a Whipple procedure in August at [facility].    She developed bilateral increased lower extremity swelling and pain.  Duplex ultrasounds were negative for a DVT.  Ultrasound survey of the IVC and iliac veins revealed patency with no occlusions.  She is on Lovenox 60 mg subcu.  She was given IV Lasix in the hospital.  I think it was felt that this made her prerenal.  She ultimately was discharged on only 40 mg a day of Lasix.    When I saw her last week, she had profound hypoglycemia.  I stopped her Lantus at that point.  Her blood sugars have generally been stable in the low 100 range.  I have no plans to reinitiate specific diabetic treatment at this point.    REVIEW OF SYSTEMS:    GENERAL:  Weights have been generally stable in the 142 range, although I do not have a weight since December 16th.   CHEST/RESPIRATORY:  She is not complaining of shortness of breath.      CARDIAC:  No chest pain.   GI:  No abdominal pain.    GU:  No dysuria.    EDEMA/VARICOSITIES:  She states her edema is better.     Physical exam: Resp : clear air entry bilaterally CVS: Hs are normal. No murmers,JVP is not elevated ABD: liver is not palpable. No sleen. Non tender.  GU: Bladder not distended. No CVA tenderness.  [id number] edema in the right hand. Large amount of edema in both legs. Extending not her flanks bilaterally   ASSESSMENT/PLAN:            Massive edema.  I had asked for a comprehensive metabolic panel, although I do not see this.  She has profound hypoalbuminemia.  The etiology here is not  completely clear.  Her BUN was 15, creatinine 1.41, giving her a stage III chronic renal failure although I do not think this is the big issue here.  I will screen her for a protein-losing enteropathy.  Echocardiogram on 09/15/2015 showed left ventricular function to be normal at 60-65%.  Right ventricular function also was normal.  Wall thickness was normal.  Mild tricuspid regurgitation.  Mild systolic pulmonary artery pressure increase.    Acute on chronic renal failure stage III.  She is going to need to be screened for significant proteinuria.  At this point, I think it would be reasonable to increase her Lasix to 40 b.i.d.         She is a type 2 diabetic.  Not currently on any treatment.  Probably some degree of diabetic-induced chronic renal failure.     DVT.  On Lovenox therapy.  This will continue indefinitely.  This does not appear to be a  major issue.    Anemia: follows with oncology.

## 2015-10-06 ENCOUNTER — Encounter (HOSPITAL_COMMUNITY)
Admission: RE | Admit: 2015-10-06 | Discharge: 2015-10-06 | Disposition: A | Discharge status: Home / Self Care | Payer: Medicare Other | Source: Skilled Nursing Facility | Attending: Internal Medicine | Admitting: Internal Medicine

## 2015-10-06 LAB — CBC
HEMATOCRIT: 19.9 % — AB (ref 36.0–46.0)
Hemoglobin: 7 g/dL — ABNORMAL LOW (ref 12.0–15.0)
MCH: 30.8 pg (ref 26.0–34.0)
MCHC: 35.2 g/dL (ref 30.0–36.0)
MCV: 87.7 fL (ref 78.0–100.0)
PLATELETS: 265 10*3/uL (ref 150–400)
RBC: 2.27 MIL/uL — ABNORMAL LOW (ref 3.87–5.11)
RDW: 21.2 % — AB (ref 11.5–15.5)
WBC: 12.5 10*3/uL — ABNORMAL HIGH (ref 4.0–10.5)

## 2015-10-06 LAB — BASIC METABOLIC PANEL
Anion gap: 4 — ABNORMAL LOW (ref 5–15)
BUN: 24 mg/dL — AB (ref 6–20)
CALCIUM: 7.8 mg/dL — AB (ref 8.9–10.3)
CO2: 25 mmol/L (ref 22–32)
CREATININE: 1.24 mg/dL — AB (ref 0.44–1.00)
Chloride: 108 mmol/L (ref 101–111)
GFR calc Af Amer: 46 mL/min — ABNORMAL LOW (ref >60)
GFR, EST NON AFRICAN AMERICAN: 40 mL/min — AB (ref >60)
GLUCOSE: 111 mg/dL — AB (ref 65–99)
POTASSIUM: 3.8 mmol/L (ref 3.5–5.1)
Sodium: 137 mmol/L (ref 135–145)

## 2015-10-07 ENCOUNTER — Encounter (HOSPITAL_COMMUNITY)
Admit: 2015-10-07 | Discharge: 2015-10-07 | Disposition: A | Discharge status: Home / Self Care | Payer: Medicare Other | Attending: Internal Medicine | Admitting: Internal Medicine

## 2015-10-07 ENCOUNTER — Encounter (HOSPITAL_COMMUNITY): Payer: Self-pay

## 2015-10-07 ENCOUNTER — Other Ambulatory Visit (HOSPITAL_COMMUNITY)
Admission: AD | Admit: 2015-10-07 | Discharge: 2015-10-07 | Disposition: A | Discharge status: Home / Self Care | Payer: Medicare Other | Source: Skilled Nursing Facility | Attending: Internal Medicine | Admitting: Internal Medicine

## 2015-10-07 ENCOUNTER — Other Ambulatory Visit (HOSPITAL_COMMUNITY): Payer: Self-pay | Admitting: Oncology

## 2015-10-07 DIAGNOSIS — D631 Anemia in chronic kidney disease: Secondary | ICD-10-CM

## 2015-10-07 DIAGNOSIS — D649 Anemia, unspecified: Secondary | ICD-10-CM | POA: Insufficient documentation

## 2015-10-07 DIAGNOSIS — N183 Chronic kidney disease, stage 3 (moderate): Principal | ICD-10-CM

## 2015-10-07 LAB — PREPARE RBC (CROSSMATCH)

## 2015-10-07 LAB — HEMOGLOBIN AND HEMATOCRIT, BLOOD
HEMATOCRIT: 20.7 % — AB (ref 36.0–46.0)
HEMOGLOBIN: 7.2 g/dL — AB (ref 12.0–15.0)

## 2015-10-07 LAB — OCCULT BLOOD X 1 CARD TO LAB, STOOL: Fecal Occult Bld: NEGATIVE

## 2015-10-07 NOTE — Progress Notes (Signed)
Results for MONEY, MADILL (MRN [id number]) as of 10/07/2015 14:58 Patient is coming on 10/08/2015 for 2 units of PRBC, instructed to come @ 0730   Ref. Range 10/07/2015 12:15  Hemoglobin Latest Ref Range: 12.0-15.0 g/dL 7.2 (L)  HCT Latest Ref Range: 36.0-46.0 % 20.7 (L)

## 2015-10-08 ENCOUNTER — Encounter (HOSPITAL_COMMUNITY): Payer: Self-pay

## 2015-10-08 ENCOUNTER — Encounter (HOSPITAL_COMMUNITY)
Admit: 2015-10-08 | Discharge: 2015-10-08 | Disposition: A | Discharge status: Home / Self Care | Payer: Medicare Other | Attending: Internal Medicine | Admitting: Internal Medicine

## 2015-10-08 MED ORDER — HEPARIN SOD (PORK) LOCK FLUSH 100 UNIT/ML IV SOLN
INTRAVENOUS | Status: AC
  Filled 2015-10-08: qty 5

## 2015-10-08 MED ORDER — HEPARIN SOD (PORK) LOCK FLUSH 100 UNIT/ML IV SOLN
500.0000 [IU] | INTRAVENOUS | Status: AC | PRN
  Administered 2015-10-08: 500 [IU]

## 2015-10-08 MED ORDER — HEPARIN SOD (PORK) LOCK FLUSH 100 UNIT/ML IV SOLN: 500.0000 [IU] | INTRAVENOUS | Status: DC | PRN

## 2015-10-08 MED ORDER — FUROSEMIDE 10 MG/ML IJ SOLN
20.0000 mg | Freq: Once | INTRAMUSCULAR | Status: AC
  Administered 2015-10-08: 20 mg via INTRAVENOUS
  Filled 2015-10-08: qty 4

## 2015-10-08 MED ORDER — SODIUM CHLORIDE 0.9 % IJ SOLN: 10.0000 mL | INTRAMUSCULAR | Status: DC | PRN

## 2015-10-08 MED ORDER — SODIUM CHLORIDE 0.9 % IV SOLN
Freq: Once | INTRAVENOUS | Status: AC
  Administered 2015-10-08: 250 mL via INTRAVENOUS

## 2015-10-08 NOTE — Progress Notes (Signed)
Patient was transfused with 2 units PRBC over 3 hours each with 20 mg Of Lasix given in between. Patient tolerated blood well without any reaction. . Report called to [facility], transferred back to room 155 via wheelchair.

## 2015-10-09 ENCOUNTER — Other Ambulatory Visit (HOSPITAL_COMMUNITY): Payer: Medicare Other

## 2015-10-09 ENCOUNTER — Encounter (HOSPITAL_COMMUNITY): Payer: Medicare Other

## 2015-10-09 LAB — TYPE AND SCREEN
ABO/RH(D): O POS
ANTIBODY SCREEN: NEGATIVE
Unit division: 0
Unit division: 0

## 2015-10-13 ENCOUNTER — Other Ambulatory Visit (HOSPITAL_COMMUNITY)
Admission: RE | Admit: 2015-10-13 | Discharge: 2015-10-13 | Disposition: A | Discharge status: Home / Self Care | Payer: Medicare Other | Source: Skilled Nursing Facility | Attending: Internal Medicine | Admitting: Internal Medicine

## 2015-10-13 DIAGNOSIS — N390 Urinary tract infection, site not specified: Secondary | ICD-10-CM | POA: Diagnosis present

## 2015-10-13 LAB — OCCULT BLOOD X 1 CARD TO LAB, STOOL: FECAL OCCULT BLD: NEGATIVE

## 2015-10-14 ENCOUNTER — Telehealth (HOSPITAL_COMMUNITY): Payer: Self-pay | Admitting: Hematology & Oncology

## 2015-10-14 ENCOUNTER — Other Ambulatory Visit (HOSPITAL_COMMUNITY)
Admission: RE | Admit: 2015-10-14 | Discharge: 2015-10-14 | Disposition: A | Discharge status: Home / Self Care | Payer: Medicare Other | Source: Skilled Nursing Facility | Attending: Internal Medicine | Admitting: Internal Medicine

## 2015-10-14 DIAGNOSIS — A079 Protozoal intestinal disease, unspecified: Secondary | ICD-10-CM | POA: Insufficient documentation

## 2015-10-14 LAB — BASIC METABOLIC PANEL
Anion gap: 4 — ABNORMAL LOW (ref 5–15)
BUN: 17 mg/dL (ref 6–20)
CHLORIDE: 111 mmol/L (ref 101–111)
CO2: 25 mmol/L (ref 22–32)
CREATININE: 1.57 mg/dL — AB (ref 0.44–1.00)
Calcium: 7.7 mg/dL — ABNORMAL LOW (ref 8.9–10.3)
GFR calc Af Amer: 35 mL/min — ABNORMAL LOW (ref >60)
GFR calc non Af Amer: 30 mL/min — ABNORMAL LOW (ref >60)
GLUCOSE: 142 mg/dL — AB (ref 65–99)
POTASSIUM: 4.1 mmol/L (ref 3.5–5.1)
SODIUM: 140 mmol/L (ref 135–145)

## 2015-10-14 LAB — OCCULT BLOOD X 1 CARD TO LAB, STOOL: Fecal Occult Bld: NEGATIVE

## 2015-10-14 NOTE — Telephone Encounter (Signed)
PC TO PT'S SISTER AND SOCIAL WORKER @ [facility] TO SEE IF THEY ARE AWARE OF THE PTS NEW INS CARRIER FOR 2017.  NEITHER HAVE ANY IDEA. PT IS NOT ANSWERING CELL PH. I WILL SPEAK TO HER ON 10/15/15 PRIOR TO HER APPT

## 2015-10-15 ENCOUNTER — Encounter (HOSPITAL_COMMUNITY): Payer: Medicare Other | Attending: Oncology

## 2015-10-15 ENCOUNTER — Encounter (HOSPITAL_COMMUNITY): Payer: Medicare Other

## 2015-10-15 ENCOUNTER — Other Ambulatory Visit (HOSPITAL_COMMUNITY)
Admission: RE | Admit: 2015-10-15 | Discharge: 2015-10-15 | Disposition: A | Discharge status: Home / Self Care | Payer: Medicare Other | Source: Skilled Nursing Facility | Attending: Internal Medicine | Admitting: Internal Medicine

## 2015-10-15 ENCOUNTER — Telehealth (HOSPITAL_COMMUNITY): Payer: Self-pay | Admitting: Hematology & Oncology

## 2015-10-15 ENCOUNTER — Non-Acute Institutional Stay (SKILLED_NURSING_FACILITY): Payer: Medicare Other | Admitting: Internal Medicine

## 2015-10-15 DIAGNOSIS — D631 Anemia in chronic kidney disease: Secondary | ICD-10-CM

## 2015-10-15 DIAGNOSIS — Z452 Encounter for adjustment and management of vascular access device: Secondary | ICD-10-CM

## 2015-10-15 DIAGNOSIS — R634 Abnormal weight loss: Secondary | ICD-10-CM

## 2015-10-15 DIAGNOSIS — Z95828 Presence of other vascular implants and grafts: Secondary | ICD-10-CM

## 2015-10-15 DIAGNOSIS — C250 Malignant neoplasm of head of pancreas: Secondary | ICD-10-CM | POA: Diagnosis not present

## 2015-10-15 DIAGNOSIS — K838 Other specified diseases of biliary tract: Secondary | ICD-10-CM | POA: Insufficient documentation

## 2015-10-15 DIAGNOSIS — N183 Chronic kidney disease, stage 3 unspecified: Secondary | ICD-10-CM

## 2015-10-15 DIAGNOSIS — E0822 Diabetes mellitus due to underlying condition with diabetic chronic kidney disease: Secondary | ICD-10-CM | POA: Diagnosis not present

## 2015-10-15 DIAGNOSIS — A079 Protozoal intestinal disease, unspecified: Secondary | ICD-10-CM | POA: Insufficient documentation

## 2015-10-15 LAB — IRON AND TIBC: Iron: 68 ug/dL (ref 28–170)

## 2015-10-15 LAB — FERRITIN: Ferritin: 625 ng/mL — ABNORMAL HIGH (ref 11–307)

## 2015-10-15 LAB — OCCULT BLOOD X 1 CARD TO LAB, STOOL: Fecal Occult Bld: NEGATIVE

## 2015-10-15 MED ORDER — HEPARIN SOD (PORK) LOCK FLUSH 100 UNIT/ML IV SOLN
500.0000 [IU] | Freq: Once | INTRAVENOUS | Status: AC
  Administered 2015-10-15: 500 [IU] via INTRAVENOUS

## 2015-10-15 MED ORDER — HEPARIN SOD (PORK) LOCK FLUSH 100 UNIT/ML IV SOLN
INTRAVENOUS | Status: AC
  Filled 2015-10-15: qty 5

## 2015-10-15 MED ORDER — SODIUM CHLORIDE 0.9 % IJ SOLN
10.0000 mL | Freq: Once | INTRAMUSCULAR | Status: AC
  Administered 2015-10-15: 10 mL via INTRAVENOUS

## 2015-10-15 NOTE — Telephone Encounter (Signed)
?'  d pt to see if she knew who her ins carrier for 2017 is. She could not remember and would call me back with the info.

## 2015-10-15 NOTE — Patient Instructions (Signed)
[place] at [facility] Discharge Instructions  RECOMMENDATIONS MADE BY THE CONSULTANT AND ANY TEST RESULTS WILL BE SENT TO YOUR REFERRING PHYSICIAN.  Lab work from port as ordered today. Return as scheduled.  Thank you for choosing [place] at [facility] to provide your oncology and hematology care.  To afford each patient quality time with our provider, please arrive at least 15 minutes before your scheduled appointment time.    You need to re-schedule your appointment should you arrive 10 or more minutes late.  We strive to give you quality time with our providers, and arriving late affects you and other patients whose appointments are after yours.  Also, if you no show three or more times for appointments you may be dismissed from the clinic at the providers discretion.     Again, thank you for choosing [facility].  Our hope is that these requests will decrease the amount of time that you wait before being seen by our physicians.       _____________________________________________________________  Should you have questions after your visit to [facility], please contact our office at [phone] between the hours of 8:30 a.m. and 4:30 p.m.  Voicemails left after 4:30 p.m. will not be returned until the following business day.  For prescription refill requests, have your pharmacy contact our office.

## 2015-10-15 NOTE — Progress Notes (Signed)
Patient in from nursing home. Part right chest already accessed. Lab specimen drawn for ferritin and iron tibc as ordered. Changed end cap on port access. Port flushed per protocol. Port needle remained accessed. Per J.Doyle,RN patient does not have prior authorization for aranesp so she will not get injection today. Patient escorted back to nursing home via wheelchair.

## 2015-10-16 ENCOUNTER — Encounter (HOSPITAL_COMMUNITY): Payer: Medicare Other

## 2015-10-16 ENCOUNTER — Other Ambulatory Visit (HOSPITAL_COMMUNITY): Payer: Medicare Other

## 2015-10-17 ENCOUNTER — Telehealth (HOSPITAL_COMMUNITY): Payer: Self-pay | Admitting: Hematology & Oncology

## 2015-10-17 NOTE — Telephone Encounter (Signed)
PT IS UNSURE OF HER INS CARRIER FOR 2017. SHE IS NO LONGER COVERED BY COVENTRY. I'VE CHECKED [place] TO SEE IF PT WAS COVERED WITH THEM. NO COV WAS EFFECTIVE PER [employer] PT HAS REG MEDICARE AND NO 2NDRY COV.

## 2015-10-17 NOTE — Progress Notes (Addendum)
Patient ID: Veronica Stevenson, female   DOB: [birth year], 82 y.o.   MRN: [id number]                PROGRESS NOTE  DATE:  10/15/2015         FACILITY: [place]                    LEVEL OF CARE:   SNF   Acute Visit            CHIEF COMPLAINT:  Declining oral intake and weight loss.     HISTORY OF PRESENT ILLNESS:  I had actually been seeing Veronica Stevenson for bilateral lower extremity edema.     She has a profoundly low albumin, last checked on 10/01/2015 at 1.6.    She also has stage III chronic renal insufficiency with a creatinine in the 1.4 range.    However, her major issue today is weight loss.  Her weight has been decreasing from 142.8 pounds on 10/01/2015 to 132 pounds on 10/13/2015.   The patient admits to not eating although she does not describe dysphagia, odynophagia, early satiety, or abdominal pain; nor does she describe altered bowel habits with one formed, normal-looking bowel movement described by the patient today.  There does not seem to be clinical evidence of malabsorption.  She appears to have lost some edema fluid but not enough to have caused all of this  REVIEW OF SYSTEMS:    GU:  No dysuria or hematuria.      PHYSICAL EXAMINATION:   CARDIAC:  GASTROINTESTINAL:  ABDOMEN:  No masses.  Her abdomen is not distended.   LIVER/SPLEEN/KIDNEYS:  No liver, no spleen.  No tenderness.    GENITOURINARY:   BLADDER:  No suprapubic or costovertebral angle tenderness.     ASSESSMENT/PLAN:                           Extreme weight loss.   The cause of this is really not certain. Some of this could be fluid loss.  As a result of this and her critical illness surgery, her albumin is 1.6.  I do not think she is eating enough to really support an increase in her albumin.    Chronic renal failure stage III, with an estimated creatinine clearance of 42.  I think she is felt to have anemia of chronic renal insufficiency.  She is going for [name] today.    Status post  Whipple procedure.  As I understand this, there is rarely a need for pancreatic enzyme replacement and there does not appear to be any evidence to suggest she is malabsorbing, in any case.  She has had stool guaiac ordered for blood.  I doubt this will be useful.  I sent her for blood transfusions late in the year.  Her hemoglobin and lab work will need to be repeated.     CPT CODE: 13086

## 2015-10-21 ENCOUNTER — Non-Acute Institutional Stay (SKILLED_NURSING_FACILITY): Payer: Medicare Other | Admitting: Internal Medicine

## 2015-10-21 DIAGNOSIS — H1089 Other conjunctivitis: Secondary | ICD-10-CM

## 2015-10-21 NOTE — Progress Notes (Signed)
Patient ID: Veronica Stevenson, female   DOB: April 12, 1933, 82 y.o.   MRN: [id number]   This is an acute visit.  Level care skilled.  Facility [facility].  Chief complaint-acute visit secondary to question erythematous conjunctiva.  History of present illness.  Patient is a pleasant 82 year old female nursing staff has noted some erythematous conjunctivae of her eyes and I am following up on this.  Patient is denying any visual changes I irritation or exudate or crusting.  Physical exam.  In general this a pleasant elderly female in no distress sitting comfortably in her chair.  Her skin is warm and dry.  Eyes-there appears to be a small amount of erythema most noticeable in her left medial conjunctivae-there is no crusting exudate or visual changes-her pupils appear to be reactive visual acuity is grossly intact-.  Assessment plan.  At this point the erythema is not very remarkable-this does not really appear to be a subconjunctival hemorrhage-or an acute conjunctivitis however this will have to be followed up on.  Consider possibly medication like artificial tears for dry eyes-certainly she develops any increased erythema or exudate  Or  other changes this will have to be reevaluated.  [id number]

## 2015-10-22 ENCOUNTER — Encounter (HOSPITAL_COMMUNITY)
Admission: AD | Admit: 2015-10-22 | Discharge: 2015-10-22 | Disposition: A | Discharge status: Home / Self Care | Payer: Medicare Other | Source: Skilled Nursing Facility | Attending: Internal Medicine | Admitting: Internal Medicine

## 2015-10-22 DIAGNOSIS — I10 Essential (primary) hypertension: Secondary | ICD-10-CM | POA: Insufficient documentation

## 2015-10-22 LAB — COMPREHENSIVE METABOLIC PANEL
ALT: 28 U/L (ref 14–54)
ANION GAP: 5 (ref 5–15)
AST: 31 U/L (ref 15–41)
Albumin: 1.6 g/dL — ABNORMAL LOW (ref 3.5–5.0)
Alkaline Phosphatase: 80 U/L (ref 38–126)
BILIRUBIN TOTAL: 0.6 mg/dL (ref 0.3–1.2)
BUN: 14 mg/dL (ref 6–20)
CHLORIDE: 110 mmol/L (ref 101–111)
CO2: 26 mmol/L (ref 22–32)
Calcium: 8 mg/dL — ABNORMAL LOW (ref 8.9–10.3)
Creatinine, Ser: 1.26 mg/dL — ABNORMAL HIGH (ref 0.44–1.00)
GFR, EST AFRICAN AMERICAN: 45 mL/min — AB (ref >60)
GFR, EST NON AFRICAN AMERICAN: 39 mL/min — AB (ref >60)
Glucose, Bld: 111 mg/dL — ABNORMAL HIGH (ref 65–99)
POTASSIUM: 3.9 mmol/L (ref 3.5–5.1)
Sodium: 141 mmol/L (ref 135–145)
TOTAL PROTEIN: 5.1 g/dL — AB (ref 6.5–8.1)

## 2015-10-22 LAB — CBC
HEMATOCRIT: 32.1 % — AB (ref 36.0–46.0)
HEMOGLOBIN: 10.7 g/dL — AB (ref 12.0–15.0)
MCH: 30.9 pg (ref 26.0–34.0)
MCHC: 33.3 g/dL (ref 30.0–36.0)
MCV: 92.8 fL (ref 78.0–100.0)
Platelets: 258 10*3/uL (ref 150–400)
RBC: 3.46 MIL/uL — AB (ref 3.87–5.11)
RDW: 16.9 % — AB (ref 11.5–15.5)
WBC: 5.9 10*3/uL (ref 4.0–10.5)

## 2015-10-23 ENCOUNTER — Other Ambulatory Visit (HOSPITAL_COMMUNITY): Payer: Medicare Other

## 2015-10-23 ENCOUNTER — Encounter (HOSPITAL_COMMUNITY): Payer: Medicare Other

## 2015-10-29 ENCOUNTER — Non-Acute Institutional Stay (SKILLED_NURSING_FACILITY): Payer: Medicare Other | Admitting: Internal Medicine

## 2015-10-29 ENCOUNTER — Other Ambulatory Visit (HOSPITAL_COMMUNITY): Payer: Medicare Other

## 2015-10-29 ENCOUNTER — Ambulatory Visit (HOSPITAL_COMMUNITY): Payer: Medicare Other

## 2015-10-29 ENCOUNTER — Ambulatory Visit (HOSPITAL_COMMUNITY): Payer: Medicare Other | Admitting: Hematology & Oncology

## 2015-10-29 DIAGNOSIS — R601 Generalized edema: Secondary | ICD-10-CM | POA: Diagnosis not present

## 2015-10-29 DIAGNOSIS — E8809 Other disorders of plasma-protein metabolism, not elsewhere classified: Secondary | ICD-10-CM | POA: Diagnosis not present

## 2015-10-29 DIAGNOSIS — N183 Chronic kidney disease, stage 3 unspecified: Secondary | ICD-10-CM

## 2015-10-30 ENCOUNTER — Encounter (HOSPITAL_COMMUNITY): Payer: Self-pay | Admitting: Hematology & Oncology

## 2015-10-30 ENCOUNTER — Encounter (HOSPITAL_COMMUNITY)
Admission: AD | Admit: 2015-10-30 | Discharge: 2015-10-30 | Disposition: A | Discharge status: Home / Self Care | Payer: Medicare Other | Source: Skilled Nursing Facility | Attending: Internal Medicine | Admitting: Internal Medicine

## 2015-10-30 NOTE — Progress Notes (Signed)
This encounter was created in error - please disregard.

## 2015-10-31 ENCOUNTER — Non-Acute Institutional Stay (SKILLED_NURSING_FACILITY): Payer: Medicare Other | Admitting: Internal Medicine

## 2015-10-31 DIAGNOSIS — D631 Anemia in chronic kidney disease: Secondary | ICD-10-CM | POA: Diagnosis not present

## 2015-10-31 DIAGNOSIS — N183 Chronic kidney disease, stage 3 unspecified: Secondary | ICD-10-CM

## 2015-10-31 DIAGNOSIS — R609 Edema, unspecified: Secondary | ICD-10-CM | POA: Diagnosis not present

## 2015-10-31 NOTE — Progress Notes (Signed)
Patient ID: Veronica Stevenson, female   DOB: May 07, 1933, 82 y.o.   MRN: [id number]                PROGRESS NOTE  DATE:  10/31/2015         FACILITY: [place]                    LEVEL OF CARE:   SNF   Acute Visit            CHIEF COMPLAINT:  Acute visit secondary to question increased edema    HISTORY OF PRESENT ILLNESS:  Veronica Stevenson is followed closely by Dr. Dellia Nims for her bilateral lower extremity edema.     She has a profoundly low albumin, last checked on1/08/2016 at 1.6.    She also has stage III chronic renal insufficiency with a creatinine in the 1.4 range recent creatinine of 1.26 on January 11.    Dr. Dellia Nims saw her earlier this month for weight loss.  Her weight had been decreasing from 142.8 pounds on 10/01/2015 to 132 pounds on 10/13/2015.  Some of this was thought to be edema related however patient had not been eating well either.  She is slowly appears gained weight since then weight was 136.5 on generally 16th and weight on the 18th of this month was 137.3.  She was seen by Dr. Dellia Nims 2 days ago-.  Nursing staff today has noted some increased hand edema this extends also to her upper arms.  However speaking with nursing saw her with Dr. Dellia Nims on Wednesday this hand edema was present as well earlier this week-  She is not complaining of any shortness of breath or chest pain appears to be somewhat more energetic than when I seen her previously.  Dr. Dellia Nims did increase her Lasix to 60 mg twice a day 2 days ago secondary to the increased edema-however nursing reports today that she actually is refusing her p.m. dose saying that it makes her urinate at night she does not like that  Family medical social history reviewed per history and physical on 09/24/2015.  Medications have been reviewed.  Eliquis 10 mg twice a day this will be titrated to 5 mg twice a day later this month   Lipitor 10 mg daily.  OxyIR 5 mg every 6 hours when  necessary.  Potassium 20:'s twice a day.  Reglan 10 mg twice a day.  Toprol-XL 25 mg daily at bedtime.  Lasix order for 60 mg twice a day apparently she only takes this in the morning     REVIEW OF SYSTEMS: General does not complain of fever or chills.  Resp-- does not clinically shortness of breath. Cardiac no chest pain.  Musculoskeletal is not complaining of joint pain this time but does have significant weakness.  GU apparently she is urinating quite a bit at night she attributes this to the Lasix and apparently is better when she only takes Lasix the morning.  Neurologic does not complain of dizziness or headache      PHYSICAL EXAMINATION:  Temperature is 98.2 pulse 82 respirations 20 blood pressure 144/73   general is a pleasant elderly female she appears to be quite frail but somewhat more energetic than when I last saw her she is sitting up in her wheelchair.  Her skin is warm and dry.  Chest is clear to auscultation there is no labored breathing. His breath sounds.  Heart is regular rate and rhythm without murmur  gallop or rub she continues with significant edema that extends up her legs up into her thigh lower abdominal area and lower back.  She does have compression hose on.  She also has edema of her arms extending into the hand-this is somewhat pitting.  Abdomen is soft nontender with positive bowel sounds.  Muscle skeletal does have some lower extremity weakness again complicated with her edema.  Neurologic grossly intact her speech is clear no lateralizing findings.  Psych she appears alert and oriented pleasant and appropriate.  Labs.  10/22/2015.  Sodium 141 potassium 3.9 BUN 14 creatinine 1.26.  Albumin 1.6 otherwise liver function tests within normal limits.  VBC 5.9 hemoglobin 10.7 platelets 258.            ASSESSMENT/PLAN:                             Dr. Dellia Nims did address this earlier in the she is refusing her p.m.  Lasix-will increase her morning Lasix to 80 mg twice a day in hopes that this will be taken more regularly-this was discussed with Dr. Dellia Nims via phone-clinically she appears weak but stable relatively speaking.    #2 history of anemia thought to be chronic-she is status post transfusion with a hemoglobin 10.7 on January 11-will update this.    #3 history of renal insufficiency creatinine 1.26 appears actually be somewhat of an improvement this was done on January 11 will update this as well.    Number4-weight loss-again she appears to be slowly gaining weight-this may be fluid related again we have increased her diuretic as noted above continue to monitor closely.    [id number]    CPT CODE: [id number]

## 2015-11-02 ENCOUNTER — Encounter: Payer: Self-pay | Admitting: Internal Medicine

## 2015-11-03 ENCOUNTER — Other Ambulatory Visit (HOSPITAL_COMMUNITY)
Admission: AD | Admit: 2015-11-03 | Discharge: 2015-11-03 | Disposition: A | Discharge status: Home / Self Care | Payer: Medicare Other | Source: Skilled Nursing Facility | Attending: Internal Medicine | Admitting: Internal Medicine

## 2015-11-03 DIAGNOSIS — N182 Chronic kidney disease, stage 2 (mild): Secondary | ICD-10-CM | POA: Insufficient documentation

## 2015-11-03 DIAGNOSIS — I10 Essential (primary) hypertension: Secondary | ICD-10-CM | POA: Diagnosis present

## 2015-11-03 DIAGNOSIS — E785 Hyperlipidemia, unspecified: Secondary | ICD-10-CM | POA: Diagnosis present

## 2015-11-03 LAB — CBC
HEMATOCRIT: 31.3 % — AB (ref 36.0–46.0)
Hemoglobin: 10.5 g/dL — ABNORMAL LOW (ref 12.0–15.0)
MCH: 30.5 pg (ref 26.0–34.0)
MCHC: 33.5 g/dL (ref 30.0–36.0)
MCV: 91 fL (ref 78.0–100.0)
PLATELETS: 270 10*3/uL (ref 150–400)
RBC: 3.44 MIL/uL — ABNORMAL LOW (ref 3.87–5.11)
RDW: 15.6 % — AB (ref 11.5–15.5)
WBC: 5.5 10*3/uL (ref 4.0–10.5)

## 2015-11-03 LAB — BASIC METABOLIC PANEL
Anion gap: 5 (ref 5–15)
BUN: 19 mg/dL (ref 6–20)
CO2: 23 mmol/L (ref 22–32)
CREATININE: 1.48 mg/dL — AB (ref 0.44–1.00)
Calcium: 8.1 mg/dL — ABNORMAL LOW (ref 8.9–10.3)
Chloride: 113 mmol/L — ABNORMAL HIGH (ref 101–111)
GFR calc Af Amer: 37 mL/min — ABNORMAL LOW (ref >60)
GFR, EST NON AFRICAN AMERICAN: 32 mL/min — AB (ref >60)
GLUCOSE: 97 mg/dL (ref 65–99)
POTASSIUM: 5 mmol/L (ref 3.5–5.1)
SODIUM: 141 mmol/L (ref 135–145)

## 2015-11-04 ENCOUNTER — Encounter (HOSPITAL_COMMUNITY): Payer: Self-pay

## 2015-11-04 ENCOUNTER — Encounter (HOSPITAL_COMMUNITY): Payer: Medicare Other

## 2015-11-04 ENCOUNTER — Encounter (HOSPITAL_BASED_OUTPATIENT_CLINIC_OR_DEPARTMENT_OTHER): Payer: Medicare Other

## 2015-11-04 VITALS — BP 98/82 | HR 90 | Temp 98.1°F | Resp 16

## 2015-11-04 DIAGNOSIS — C250 Malignant neoplasm of head of pancreas: Secondary | ICD-10-CM

## 2015-11-04 DIAGNOSIS — D638 Anemia in other chronic diseases classified elsewhere: Secondary | ICD-10-CM

## 2015-11-04 DIAGNOSIS — N183 Chronic kidney disease, stage 3 unspecified: Secondary | ICD-10-CM

## 2015-11-04 DIAGNOSIS — D631 Anemia in chronic kidney disease: Secondary | ICD-10-CM

## 2015-11-04 MED ORDER — DARBEPOETIN ALFA 60 MCG/0.3ML IJ SOSY
60.0000 ug | PREFILLED_SYRINGE | Freq: Once | INTRAMUSCULAR | Status: AC
  Administered 2015-11-04: 60 ug via SUBCUTANEOUS

## 2015-11-04 MED ORDER — DARBEPOETIN ALFA 60 MCG/0.3ML IJ SOSY
PREFILLED_SYRINGE | INTRAMUSCULAR | Status: AC
Start: 2015-11-04 — End: 2015-11-04
  Filled 2015-11-04: qty 0.3

## 2015-11-04 NOTE — Progress Notes (Signed)
..  Veronica Stevenson presents today for injection per the provider's orders. Labs used from 11/03/2015.  aranesp administration without incident; see MAR for injection details.  Patient tolerated procedure well and without incident.  Her edema has improved.  No questions or complaints noted at this time.

## 2015-11-04 NOTE — Progress Notes (Signed)
See other encounter for 11/04/2015

## 2015-11-04 NOTE — Progress Notes (Addendum)
Patient ID: Veronica Stevenson, female   DOB: 01-31-1933, 82 y.o.   MRN: [id number]                PROGRESS NOTE  DATE:  10/29/2015       FACILITY: [facility]                     LEVEL OF CARE:   SNF   Acute Visit                         CHIEF COMPLAINT:  Review of medical issues, ?discharge planning.      HISTORY OF PRESENT ILLNESS:  [name] is a patient who came to [place] in December.  She had a complicated admission to [facility] from 09/18/2015 through 09/23/2015.  She had undergone a Whipple procedure for pancreatic cancer.  As I understand things, she has positive margins and there are some plans now for adjunctive chemotherapy with Dr. Whitney Muse.    She also had severe bilateral lower extremity edema which was fully worked up in [place].  She had no DVT.  Sonograms of the IVC and iliac veins were normal.  I believe she had an echocardiogram.  Actually, the edema is not just in her lower extremities but in her coccyx extending up into her flanks, her right greater than left arm distally.  Her current dose of Lasix is 40 mg b.i.d.   Her weight has been generally stable to increased this month, most recently at 136 pounds.    With regards to discharge planning, in early December she also had a right arm DVT.  For a period of time she was on Xarelto, although this was stopped apparently due to GI issues.  In any case, she has remained on Lovenox at 60 mg a day.  The patient really does not have anybody at home who can help her with this.    Finally, she is a diabetic.  She does not appear to be currently on any insulin.  Her blood sugars are generally in the 80s to 90s fasting, anywhere from 104 to 144 a.c. dinner.  This is largely satisfactory.  At this point, I do not think she needs any treatment.    The patient's oral intake has been fairly marginal.  Last lab work showed a BUN of 14, creatinine of 1.26.  Her albumin is 1.6, not much changed from when she first came here.   This is in spite of protein supplements, etc., etc., etc.  I have not been able to identify any protein loss.  She is certainly not nephrotic.  There is no evidence that she is malabsorbing.      REVIEW OF SYSTEMS:    CHEST/RESPIRATORY:  No cough.  No sputum.    CARDIAC:  No chest pain.   GI:  No nausea, vomiting, or diarrhea.      GU:  No dysuria.    MUSCULOSKELETAL:  The patient has severe bilateral knee pain when she walks.  Also complains of groin pain when she walks.    PHYSICAL EXAMINATION:   GENERAL APPEARANCE:   The patient is not in any distress.  Pleasant and cooperative.              CHEST/RESPIRATORY:  Clear air entry bilaterally.    CARDIOVASCULAR:   CARDIAC:  Heart sounds are normal.  Her JVP is not elevated.  She has coccyx edema, pitting, edema  up into her flank.   BREASTS:  She even has dependent edema in the lower aspect of her breasts.      GASTROINTESTINAL:   LIVER/SPLEEN/KIDNEYS:  No liver, no spleen.  No tenderness.     GENITOURINARY:   BLADDER:  Her bladder is not distended.     CIRCULATION:   EDEMA/VARICOSITIES:  Extremities:  She has massive edema right up into her groin area.  Even her right arm has pitting edema greater than the left.   NEUROLOGICAL:    BALANCE/GAIT:  She can bring herself to a standing position with some difficulty.  She walks flexed and valgus at the knees.  She really only walked about four yards with me, from one chair to the next, but looked completely exhausted and plopped herself down in a chair.    ASSESSMENT/PLAN:                     Status post Whipple procedure.  She apparently had positive margins, if I am interpreting her surgeon's notes correctly in [facility]where.  It is difficult to see her going through an aggressive chemotherapy regimen.    Massive edema.  The only factor that I have been able to identify is profound hypoalbuminemia.  She did have an echocardiogram in early December.  She had a normal ejection fraction.  Her  right ventricle was normal.  There is no evidence that she has a protein-losing enteropathy, and previous work-up at [facility] did not show any major vein obstruction.  She does have stage 3 CRF but I am doubtful this explians this. I will increased her lasix   History of a right arm DVT.  This does not appear to be currently problematic.  She is still on Lovenox, which would make it very difficult for her at home.  She does not have anybody to help her with this.  I am going to try to change her to Eliquis.  Although her Xarelto intolerance was questionable, I am not going to go down this road.  She should have a few more days here, perhaps into next week, to see if we can use the Eliquis.  She will need to be on anticoagulation for probably another 2-3 months (started in late December).    It is very difficult to see this woman being alone at home in her house in [place].  She has some nephews around, but it does not sound like they are a lot of help.  She has a sister in [place] who has also been ill.  She does have church members and friends nearby.  This may be the best we can offer.  I am hopeful she can get more therapy before she leaves here.

## 2015-11-05 ENCOUNTER — Encounter (HOSPITAL_COMMUNITY)
Admission: AD | Admit: 2015-11-05 | Discharge: 2015-11-05 | Disposition: A | Discharge status: Home / Self Care | Payer: Medicare Other | Source: Skilled Nursing Facility | Attending: Internal Medicine | Admitting: Internal Medicine

## 2015-11-05 LAB — BASIC METABOLIC PANEL
Anion gap: 3 — ABNORMAL LOW (ref 5–15)
BUN: 19 mg/dL (ref 6–20)
CHLORIDE: 111 mmol/L (ref 101–111)
CO2: 25 mmol/L (ref 22–32)
CREATININE: 1.49 mg/dL — AB (ref 0.44–1.00)
Calcium: 8.2 mg/dL — ABNORMAL LOW (ref 8.9–10.3)
GFR calc non Af Amer: 32 mL/min — ABNORMAL LOW (ref >60)
GFR, EST AFRICAN AMERICAN: 37 mL/min — AB (ref >60)
GLUCOSE: 120 mg/dL — AB (ref 65–99)
Potassium: 4.5 mmol/L (ref 3.5–5.1)
Sodium: 139 mmol/L (ref 135–145)

## 2015-11-06 ENCOUNTER — Encounter (HOSPITAL_COMMUNITY): Payer: Medicare Other

## 2015-11-06 ENCOUNTER — Other Ambulatory Visit (HOSPITAL_COMMUNITY): Payer: Medicare Other

## 2015-11-11 ENCOUNTER — Encounter (HOSPITAL_COMMUNITY)
Admission: RE | Admit: 2015-11-11 | Discharge: 2015-11-11 | Disposition: A | Discharge status: Home / Self Care | Payer: Medicare Other | Source: Skilled Nursing Facility | Attending: Internal Medicine | Admitting: Internal Medicine

## 2015-11-11 DIAGNOSIS — N182 Chronic kidney disease, stage 2 (mild): Secondary | ICD-10-CM | POA: Diagnosis present

## 2015-11-11 DIAGNOSIS — D638 Anemia in other chronic diseases classified elsewhere: Secondary | ICD-10-CM | POA: Insufficient documentation

## 2015-11-11 DIAGNOSIS — I10 Essential (primary) hypertension: Secondary | ICD-10-CM | POA: Diagnosis present

## 2015-11-11 LAB — BASIC METABOLIC PANEL
ANION GAP: 5 (ref 5–15)
BUN: 19 mg/dL (ref 6–20)
CALCIUM: 7.9 mg/dL — AB (ref 8.9–10.3)
CHLORIDE: 111 mmol/L (ref 101–111)
CO2: 24 mmol/L (ref 22–32)
Creatinine, Ser: 1.52 mg/dL — ABNORMAL HIGH (ref 0.44–1.00)
GFR calc non Af Amer: 31 mL/min — ABNORMAL LOW (ref >60)
GFR, EST AFRICAN AMERICAN: 36 mL/min — AB (ref >60)
Glucose, Bld: 96 mg/dL (ref 65–99)
POTASSIUM: 4.4 mmol/L (ref 3.5–5.1)
Sodium: 140 mmol/L (ref 135–145)

## 2015-11-11 LAB — CBC WITH DIFFERENTIAL/PLATELET
BASOS ABS: 0 10*3/uL (ref 0.0–0.1)
BASOS PCT: 0 %
Eosinophils Absolute: 0 10*3/uL (ref 0.0–0.7)
Eosinophils Relative: 0 %
HEMATOCRIT: 31.1 % — AB (ref 36.0–46.0)
HEMOGLOBIN: 10.6 g/dL — AB (ref 12.0–15.0)
Lymphocytes Relative: 31 %
Lymphs Abs: 1.5 10*3/uL (ref 0.7–4.0)
MCH: 31.3 pg (ref 26.0–34.0)
MCHC: 34.1 g/dL (ref 30.0–36.0)
MCV: 91.7 fL (ref 78.0–100.0)
MONOS PCT: 8 %
Monocytes Absolute: 0.4 10*3/uL (ref 0.1–1.0)
NEUTROS ABS: 3 10*3/uL (ref 1.7–7.7)
NEUTROS PCT: 61 %
Platelets: 240 10*3/uL (ref 150–400)
RBC: 3.39 MIL/uL — ABNORMAL LOW (ref 3.87–5.11)
RDW: 15.9 % — ABNORMAL HIGH (ref 11.5–15.5)
WBC: 4.9 10*3/uL (ref 4.0–10.5)

## 2015-11-13 ENCOUNTER — Encounter (HOSPITAL_COMMUNITY)
Admission: RE | Admit: 2015-11-13 | Discharge: 2015-11-13 | Disposition: A | Discharge status: Home / Self Care | Payer: Medicare Other | Source: Skilled Nursing Facility | Attending: Internal Medicine | Admitting: Internal Medicine

## 2015-11-13 DIAGNOSIS — R531 Weakness: Secondary | ICD-10-CM | POA: Insufficient documentation

## 2015-11-13 DIAGNOSIS — I10 Essential (primary) hypertension: Secondary | ICD-10-CM | POA: Diagnosis not present

## 2015-11-13 DIAGNOSIS — R10813 Right lower quadrant abdominal tenderness: Secondary | ICD-10-CM | POA: Insufficient documentation

## 2015-11-13 DIAGNOSIS — N182 Chronic kidney disease, stage 2 (mild): Secondary | ICD-10-CM | POA: Insufficient documentation

## 2015-11-13 DIAGNOSIS — Z9221 Personal history of antineoplastic chemotherapy: Secondary | ICD-10-CM | POA: Insufficient documentation

## 2015-11-13 LAB — URINALYSIS, ROUTINE W REFLEX MICROSCOPIC
BILIRUBIN URINE: NEGATIVE
Glucose, UA: NEGATIVE mg/dL
HGB URINE DIPSTICK: NEGATIVE
KETONES UR: NEGATIVE mg/dL
Leukocytes, UA: NEGATIVE
Nitrite: NEGATIVE
Protein, ur: NEGATIVE mg/dL
SPECIFIC GRAVITY, URINE: 1.025 (ref 1.005–1.030)
pH: 5 (ref 5.0–8.0)

## 2015-11-15 ENCOUNTER — Non-Acute Institutional Stay (SKILLED_NURSING_FACILITY): Payer: Medicare Other | Admitting: Internal Medicine

## 2015-11-15 DIAGNOSIS — N183 Chronic kidney disease, stage 3 unspecified: Secondary | ICD-10-CM

## 2015-11-15 DIAGNOSIS — R609 Edema, unspecified: Secondary | ICD-10-CM

## 2015-11-15 DIAGNOSIS — E8809 Other disorders of plasma-protein metabolism, not elsewhere classified: Secondary | ICD-10-CM

## 2015-11-15 DIAGNOSIS — D631 Anemia in chronic kidney disease: Secondary | ICD-10-CM

## 2015-11-15 LAB — URINE CULTURE

## 2015-11-17 ENCOUNTER — Encounter (HOSPITAL_COMMUNITY)
Admission: AD | Admit: 2015-11-17 | Discharge: 2015-11-17 | Disposition: A | Discharge status: Home / Self Care | Payer: Medicare Other | Source: Skilled Nursing Facility | Attending: Internal Medicine | Admitting: Internal Medicine

## 2015-11-17 DIAGNOSIS — I10 Essential (primary) hypertension: Secondary | ICD-10-CM | POA: Diagnosis not present

## 2015-11-17 LAB — BASIC METABOLIC PANEL
Anion gap: 5 (ref 5–15)
BUN: 18 mg/dL (ref 6–20)
CALCIUM: 7.5 mg/dL — AB (ref 8.9–10.3)
CO2: 25 mmol/L (ref 22–32)
Chloride: 108 mmol/L (ref 101–111)
Creatinine, Ser: 1.69 mg/dL — ABNORMAL HIGH (ref 0.44–1.00)
GFR calc Af Amer: 32 mL/min — ABNORMAL LOW (ref >60)
GFR, EST NON AFRICAN AMERICAN: 27 mL/min — AB (ref >60)
GLUCOSE: 122 mg/dL — AB (ref 65–99)
POTASSIUM: 3.8 mmol/L (ref 3.5–5.1)
Sodium: 138 mmol/L (ref 135–145)

## 2015-11-18 ENCOUNTER — Other Ambulatory Visit (HOSPITAL_COMMUNITY): Payer: Medicare Other

## 2015-11-18 ENCOUNTER — Ambulatory Visit (HOSPITAL_COMMUNITY): Payer: Medicare Other

## 2015-11-19 ENCOUNTER — Non-Acute Institutional Stay (SKILLED_NURSING_FACILITY): Payer: Medicare Other | Admitting: Internal Medicine

## 2015-11-19 ENCOUNTER — Encounter: Payer: Self-pay | Admitting: Internal Medicine

## 2015-11-19 ENCOUNTER — Encounter (HOSPITAL_COMMUNITY)
Admission: AD | Admit: 2015-11-19 | Discharge: 2015-11-19 | Disposition: A | Discharge status: Home / Self Care | Payer: Medicare Other | Source: Skilled Nursing Facility | Attending: Internal Medicine | Admitting: Internal Medicine

## 2015-11-19 DIAGNOSIS — R600 Localized edema: Secondary | ICD-10-CM | POA: Diagnosis not present

## 2015-11-19 DIAGNOSIS — N183 Chronic kidney disease, stage 3 unspecified: Secondary | ICD-10-CM

## 2015-11-19 DIAGNOSIS — E43 Unspecified severe protein-calorie malnutrition: Secondary | ICD-10-CM

## 2015-11-19 NOTE — Progress Notes (Signed)
Patient ID: Veronica Stevenson, female   DOB: [birth year], 82 y.o.   MRN: [id number]                 PROGRESS NOTE  DATE:   11/19/2015         FACILITY: [facility]                    LEVEL OF CARE:   SNF   Acute Visit            CHIEF COMPLAINT:  Acute visit follow-up weight gain  HISTORY OF PRESENT ILLNESS:  Veronica Stevenson is followed closely by Dr. Dellia Nims for her bilateral lower extremity edema.     She has a profoundly low albumin, last checked on1/08/2016 at 1.6.    She also has stage III chronic renal insufficiency with a creatinine in the mid ones most recent 1.69 on lab done on February 6.  She continues to have significant edema although this apparently has improved somewhat-however she appeared to be gaining weight she was seen by Dr. Dellia Nims this weekend and he did increase her Lasix as of February 6 to 80 mg twice a day Avalide she had been on 60 mg twice a day previously.  Her most recent weight actually so somewhat set 131.2 it appears she had been in the mid 130s previously before the Lasix was increased.  However there is quite a bit of scale variability.  On exam today however it does appear her edema is somewhat improved although still is fairly significant.  She does not complaining of any increased shortness of breath. appears clinically to be doing a bit better although very fragile individual      Family medical social history reviewed per history and physical on 09/24/2015.  Medications have been reviewed.  Eliquis 5 mg by mouth twice a day   Lipitor 10 mg daily.  OxyIR 5 mg every 6 hours when necessary.  Potassium 20:'s twice a day.  Reglan 10 mg twice a day.  Toprol-XL 25 mg daily at bedtime.  Lasix 80 mg twice a day     REVIEW OF SYSTEMS : General does not complain of fever or chills.  Resp-- does not clomplain shortness of breath  . Cardiac no chest pain.--Edema appears somewhat improved today  Musculoskeletal is not  complaining of joint pain this time but does have significant weakness.    Neurologic does not complain of dizziness or headache     Psych does not appear overtly depressed or anxious   PHYSICAL EXAMINATION: Temperature 97.9 pulse 86 respirations 18 blood pressure 111/74 weight is 131.2   general is a pleasant elderly female she appears to be quite frail  But appears to gradually be gaining some strength  Her skin is warm and dry.  Chest is clear to auscultation there is no labored breathing.   Heart is regular rate and rhythm without murmur gallop or rub she continues with significant edema that extends up her legs ---this appears somewhat improved from last exam.however    Abdomen is soft nontender with positive bowel sounds. Abdomen is protuberant there is a well-healed  surgical scar  Muscle skeletal does have some lower extremity weakness again complicated with her edema.  Neurologic grossly intact her speech is clear no lateralizing findings.  Psych she appears alert and oriented pleasant and appropriate.  Labs.  11/17/2015.  Sodium 138 potassium 3.8 BUN 18 creatinine 1.69.  11/11/2015.  Sodium 140 potassium 4.4 BUN  18 creatinine 1.5.  WBC 4.9 hemoglobin 10.6 platelets 240.    10/22/2015.  Sodium 141 potassium 3.9 BUN 14 creatinine 1.26.  Albumin 1.6 otherwise liver function tests within normal limits.  VBC 5.9 hemoglobin 10.7 platelets 258.            ASSESSMENT/PLAN:                            History of edema weight gain-weight gain appears to be moderated appears to actually be trending down since Lasix was increased to 80 mg twice a day-at this point will monitor she will need an updated metabolic panel--edema appears somewhat improved although still significant--this is complicated with her low albumin as noted above   #2 history of anemia thought to be chronic-she is status post transfusion with a hemoglobin 10.6 on January 31-will update  this--this appears to have stabilized.                                     #3 history of renal insufficiency --Baseline creatinine appears to be mid ones most recently 1.69 on February 6 update BMP has been ordered        [id number]

## 2015-11-20 ENCOUNTER — Other Ambulatory Visit (HOSPITAL_COMMUNITY): Payer: Medicare Other

## 2015-11-20 ENCOUNTER — Encounter (HOSPITAL_COMMUNITY): Payer: Medicare Other

## 2015-11-20 ENCOUNTER — Encounter (HOSPITAL_COMMUNITY)
Admission: AD | Admit: 2015-11-20 | Discharge: 2015-11-20 | Disposition: A | Discharge status: Home / Self Care | Payer: Medicare Other | Source: Skilled Nursing Facility | Attending: Internal Medicine | Admitting: Internal Medicine

## 2015-11-20 DIAGNOSIS — I10 Essential (primary) hypertension: Secondary | ICD-10-CM | POA: Diagnosis not present

## 2015-11-20 LAB — BASIC METABOLIC PANEL
ANION GAP: 4 — AB (ref 5–15)
BUN: 18 mg/dL (ref 6–20)
CALCIUM: 7.5 mg/dL — AB (ref 8.9–10.3)
CO2: 28 mmol/L (ref 22–32)
Chloride: 107 mmol/L (ref 101–111)
Creatinine, Ser: 1.67 mg/dL — ABNORMAL HIGH (ref 0.44–1.00)
GFR calc Af Amer: 32 mL/min — ABNORMAL LOW (ref >60)
GFR calc non Af Amer: 28 mL/min — ABNORMAL LOW (ref >60)
GLUCOSE: 98 mg/dL (ref 65–99)
POTASSIUM: 3.7 mmol/L (ref 3.5–5.1)
SODIUM: 139 mmol/L (ref 135–145)

## 2015-11-20 NOTE — Progress Notes (Signed)
Patient ID: Veronica Stevenson, female   DOB: November 08, 1932, 82 y.o.   MRN: [id number]                PROGRESS NOTE   DATE:  11/15/2015         FACILITY: [facility]                LEVEL OF CARE:   SNF   Acute Visit            CHIEF COMPLAINT:  Follow up edema.       HISTORY OF PRESENT ILLNESS:  Veronica Stevenson is a patient who initially came to [place] after a Whipple procedure at [facility].  There were some positive margins here.  I do not think she is being planned for any adjuvant chemotherapy.  After some discussion, it was not felt that she could return home and I think she is going to be an ongoing patient here.    Even at [facility], she had severe bilateral lower extremity edema.  At the time, she had no evidence of DVT.  Sonograms of the IVC and iliac veins were normal.    I believe she had an echocardiogram which showed an EF of 60-65% and grade 1 diastolic dysfunction.  This was done in December 2016.    Her edema at the time she was at [facility] in early December was felt secondary to Gemzar and recent dexamethasone.  This is clearly not correct as she has essentially anasarca.    She also had a right upper extremity cephalic vein DVT and I put her on Eliquis, starting on 10/29/2015.  I also increased her Lasix to 60 b.i.d. at that time.     Staff report increasing edema and I was asked to look at this again.  I note that our service has seen her since then.    LABORATORY DATA:   Recent lab work on 11/11/2015:      BUN 19, creatinine 1.52.  This is reasonably stable for her.    Estimated GFR is 36.    White count 4.9, hemoglobin 10.6.  Differential count is normal.    REVIEW OF SYSTEMS:    GENERAL:  The patient states she is doing better.   CHEST/RESPIRATORY:  No shortness of breath.   CARDIAC:  No chest pain.   GI:  No abdominal pain.   No nausea or vomiting.  States her bowel movements are normal.   GU:  No dysuria.     PHYSICAL EXAMINATION:   GENERAL APPEARANCE:  The  patient is not in any distress.  Had recently voided in a bedside commode.  Very little urine.   CHEST/RESPIRATORY:  There is decreased air entry over the right lower lobe.  Dullness to percussion.      CARDIOVASCULAR:   CARDIAC:  Heart sounds are normal.  JVP is not elevated.    EDEMA/VARICOSITIES:  Massive pitting edema which extends all the way up her back.  She also has edema of the left greater than right breast.  Abdominal wall edema, etc.  She has edema of the right greater than left arm.    ASSESSMENT/PLAN:                    Edema/anasarca.  This has largely stabilized, at least in terms of her weight, since the last time I saw her 2-3 weeks ago.  As to the cause of this, I am not completely certain.  As far  as I am aware, there is no evidence of malabsorption.  As far as I am aware, she does not have chronic liver disease.  She has a profoundly low albumin at 1.6, last checked on 10/22/2015.  The patient states she is eating well.  She is on a protein supplement.  Currently on Lasix 80 daily, which has really only served to stabilize her weight.  She is not spilling protein in her urine.  I have checked her for this on two occasions.  The last weight I have is 135.8 pounds.  This seems stable over the last 2-3 weeks, although she has certainly not lost any weight.      Anemia of chronic renal insufficiency.  She receives [name] at the cancer center, although I note that her recent hemoglobin was over 10 at 10.6.    Her dose of Lasix is 80 daily.  I am going to increase this to 80 b.i.d. and follow her BUN and creatinine carefully.   She does not show any evidence of heart failure.  Place foley catheter  She remains on Eliquis for the DVT in her right arm.     CPT CODE: 96295

## 2015-11-21 ENCOUNTER — Encounter (HOSPITAL_COMMUNITY): Payer: Self-pay | Admitting: Hematology & Oncology

## 2015-11-21 ENCOUNTER — Encounter (HOSPITAL_COMMUNITY): Payer: Medicare Other | Attending: Oncology | Admitting: Hematology & Oncology

## 2015-11-21 VITALS — BP 114/64 | HR 81 | Temp 97.8°F | Resp 18 | Wt 138.0 lb

## 2015-11-21 DIAGNOSIS — R634 Abnormal weight loss: Secondary | ICD-10-CM | POA: Insufficient documentation

## 2015-11-21 DIAGNOSIS — N183 Chronic kidney disease, stage 3 unspecified: Secondary | ICD-10-CM

## 2015-11-21 DIAGNOSIS — R600 Localized edema: Secondary | ICD-10-CM | POA: Diagnosis not present

## 2015-11-21 DIAGNOSIS — R197 Diarrhea, unspecified: Secondary | ICD-10-CM | POA: Diagnosis not present

## 2015-11-21 DIAGNOSIS — K838 Other specified diseases of biliary tract: Secondary | ICD-10-CM | POA: Insufficient documentation

## 2015-11-21 DIAGNOSIS — K909 Intestinal malabsorption, unspecified: Secondary | ICD-10-CM

## 2015-11-21 DIAGNOSIS — C250 Malignant neoplasm of head of pancreas: Secondary | ICD-10-CM

## 2015-11-21 DIAGNOSIS — D631 Anemia in chronic kidney disease: Secondary | ICD-10-CM

## 2015-11-21 MED ORDER — PANCRELIPASE (LIP-PROT-AMYL) 12000-38000 UNITS PO CPEP: 24000.0000 [IU] | ORAL_CAPSULE | Freq: Three times a day (TID) | ORAL | Status: AC

## 2015-11-21 NOTE — Patient Instructions (Signed)
..  [place] at [facility] Discharge Instructions  RECOMMENDATIONS MADE BY THE CONSULTANT AND ANY TEST RESULTS WILL BE SENT TO YOUR REFERRING PHYSICIAN.  We will try a Pancreatic enzyme replacement- 2 capsules with each meal We will see if that improves your swelling over the next 2-3 weeks We will get lab work just before you come back to see [place] in 2-3 weeks  Thank you for choosing [place] at [facility] to provide your oncology and hematology care.  To afford each patient quality time with our provider, please arrive at least 15 minutes before your scheduled appointment time.   Beginning January 23rd 2017 lab work for the [facility] will be done in the  Main lab at [facility] on 1st floor. If you have a lab appointment with the [facility] please come in thru the  Main Entrance and check in at the main information desk  You need to re-schedule your appointment should you arrive 10 or more minutes late.  We strive to give you quality time with our providers, and arriving late affects you and other patients whose appointments are after yours.  Also, if you no show three or more times for appointments you may be dismissed from the clinic at the providers discretion.     Again, thank you for choosing [facility].  Our hope is that these requests will decrease the amount of time that you wait before being seen by our physicians.       _____________________________________________________________  Should you have questions after your visit to [facility], please contact our office at [phone] between the hours of 8:30 a.m. and 4:30 p.m.  Voicemails left after 4:30 p.m. will not be returned until the following business day.  For prescription refill requests, have your pharmacy contact our office.

## 2015-11-21 NOTE — Progress Notes (Signed)
[facility] [place] NOTE  Name: [name]      MRN: [id number]      REFERRING PHYSICIAN:  Manus Rudd, MD  REASON FOR CONSULT:  3 cm Pancreatic mass at head of pancreas   DIAGNOSIS:  Adenocarcinoma of the pancreas Whipple procedure with Dr. Eugenia Pancoast on 06/05/2015 Final pathology with adenocarcinoma, ductal type, moderately differentiated, 3.0 cm and invades into the duodenal muscularis propria. Neoplasm is present at inked uncinate margin and pancreatic margin. All other margins free. No LVI. Positive for perineural invasion. 0/10 LN involved Stage IIA, T3, N0, M0 CT C/A/P on 04/18/2015 with severe intrahepatic/extrahepatic biliary ductal and pancreatic ductal dilatation secondary to 3 cm pancreatic head mass Single Agent Gemzar started on 08/15/2015, patient only received 2 treatments with last on 08/22/2015    Pancreatic cancer ([place])   04/17/2015 Tumor Marker Results for Veronica Stevenson (MRN [id number]) as of 07/25/2015 09:24  04/17/2015 08:57 CA 19-9: 701 (H)    04/18/2015 Imaging CT abd/pelvis- Severe intrahepatic and extrahepatic biliary ductal and pancreatic ductal dilatation is noted secondary to 3 cm pancreatic head mass consistent with malignancy.   04/23/2015 Pathology Results BILE DUCT BRUSHING (SPECIMEN 1 OF 1 COLLECTED 04/23/2015) NONDIAGNOSTIC MATERIAL.   04/23/2015 Procedure ERCP- Dr. Ardis Hughs   06/05/2015 Surgery Dr. Eugenia Pancoast at [place] procedure demonstrating a T3N0 pancreatic mass with positive ucinate margin   07/18/2015 Tumor Marker Results for Veronica Stevenson (MRN [id number]) as of 07/25/2015 09:24  07/18/2015 08:52 CA 19-9: 49 (H)    08/15/2015 - 08/22/2015 Chemotherapy Gemcitabine single agent days 1, 8 every 28 days.   08/26/2015 Procedure Port placed by IR due to poor veinous access.   08/27/2015 Miscellaneous 2 unit PRBC transfusion   08/29/2015 Treatment Plan Change Deletion of day 15 of treatment due to low ANC on cycle 1 Day  15.   09/15/2015 Echocardiogram Left ventricle: The cavity size was normal. Wall thickness was normal. Systolic function was normal. The estimated ejection fraction was in the range of 60% to 65%. Wall motion was normal; there were no regional wall motion abnormalities. Doppler   09/18/2015 - 09/23/2015 Hospital Admission Leg pain, swelling, and fall.  Discharged to [facility]   09/18/2015 Imaging MRI L-spine- Multilevel spondylosis as described with similar alignment to previous CT. There is severe multifactorial spinal stenosis at L4-5. 2. No acute osseous findings or evidence of metastatic disease. 3. Possible paraspinous edema superiorly, po   09/19/2015 Imaging [place] - Sonographic survey of the IVC and iliac veins demonstrates patent distal IVC and bilateral iliac veins.   09/19/2015 Imaging [place] B/L LE- No significant occlusive DVT in either extremity.     HISTORY OF PRESENT ILLNESS:   Veronica Stevenson is a 82 year old woman here today for ongoing follow-up of resected pancreatic cancer. (Stage IIA, T3N0M0)  She had a whipple procedure but unfortunately had a positive margin after surgery.   She received only 2 treatments with Gemzar.  Ms. Stup returns to the [place] alone today. She is in a wheelchair and wrapped in blankets.  She says she is still at [facility], and still suffering from significant edema.  When asked about her appetite, she says she eats everything they put in front of her. She says she's eating "real well now."  She confirms that she is still having diarrhea, but it's not as bad now that they give her immodium. She denies any belly  pain, but her belly is firm when palpated during the physical exam.    When asked if they are doing any therapy with her over at [facility], she says yes; "but right now I'm just walking." She says that the one R breast is still swollen, but the other one "went down" maybe 3-4 days ago. She says someone assists her in and out of  bed.  When it comes to starting her pancreatic enzymes, she says "it's worth a try," and "I will try anything at this point."  She doesn't like her foley, commenting that it's a little bit painful and bothering her a little bit, but she says "I can deal with it. If you have to deal with something, you do it, so I can deal with it."  She confirms that she still has a port-a-cath and that they are taking care of it over at the [facility].  PAST MEDICAL HISTORY:   Past Medical History  Diagnosis Date  . Essential hypertension   . Hyperlipidemia   . Asthmatic bronchitis   . Chronic pancreatitis ([place])   . Osteoarthritis of knee   . Atypical ductal hyperplasia of right breast   . Vitamin D deficiency   . GERD (gastroesophageal reflux disease)   . Hyperparathyroidism ([place])   . Zollinger-Ellison syndrome   . Carotid artery disease ([place])   . Cancer ([place])   . Spinal stenosis of lumbar region 09/20/2015  . Anemia of chronic renal failure, stage 3 (moderate) 09/19/2015    ALLERGIES: Allergies  Allergen Reactions  . Codeine Itching  . Omeprazole Nausea And Vomiting  . Penicillins Other (See Comments)    "blacked out"      MEDICATIONS: I have reviewed the patient's current medications.    Current Facility-Administered Medications on File Prior to Visit  Medication Dose Route Frequency Provider Last Rate Last Dose  . sodium chloride 0.9 % injection 10 mL  10 mL Intravenous PRN Patrici Ranks, MD   10 mL at 08/29/15 0940   Current Outpatient Prescriptions on File Prior to Visit  Medication Sig Dispense Refill  . acetaminophen (TYLENOL) 500 MG tablet Take 500 mg by mouth every 6 (six) hours as needed for mild pain or moderate pain.    Marland Kitchen atorvastatin (LIPITOR) 10 MG tablet Take 10 mg by mouth daily after lunch.     . Cholecalciferol (VITAMIN D) 2000 UNITS CAPS Take 1 capsule by mouth every morning.     . furosemide (LASIX) 20 MG tablet TAKE 2 TABLETS DAILY FOR 2 MORE DAYS AND THEN 1  TABLET DAILY THEREAFTER FOR LOWER EXTREMITY EDEMA. (Patient taking differently: 80 mg 2 (two) times daily. Marland Kitchen)    . insulin glargine (LANTUS) 100 UNIT/ML injection Inject 0.1 mLs (10 Units total) into the skin at bedtime.    . lidocaine-prilocaine (EMLA) cream Apply 1 application topically as needed. 30 g 3  . loperamide (IMODIUM A-D) 2 MG tablet Take 2 mg by mouth as needed for diarrhea or loose stools.    . metoCLOPramide (REGLAN) 10 MG tablet Take 1 tablet (10 mg total) by mouth 2 (two) times daily as needed for nausea. And at night 30 tablet 0  . metoprolol succinate (TOPROL-XL) 25 MG 24 hr tablet Take 25 mg by mouth every evening.     Marland Kitchen oxyCODONE (OXY IR/ROXICODONE) 5 MG immediate release tablet Take 1 tablet (5 mg total) by mouth every 6 (six) hours as needed. 120 tablet 0  . potassium chloride (K-DUR) 10  MEQ tablet Take 1 tablet (10 mEq total) by mouth 2 (two) times daily. (Patient taking differently: Take 20 mEq by mouth 2 (two) times daily. )       PAST SURGICAL HISTORY Past Surgical History  Procedure Laterality Date  . Colonoscopy  2009      . Tonsillectomy    . Cataract extraction, bilateral Bilateral   . Parathyroidectomy    . Ovarian cyst removal    . Eus N/A 04/17/2015    Jacobs-1.7 cm x 2.5 cm mass in head of pancreas causing dilation of pancreatic duct and common bile duct.  . Ercp N/A 04/23/2015    [id number], heterogeneous,indistinctly bordered mass in head of pancreas  . Sphincterotomy N/A 04/23/2015    Procedure: SPHINCTEROTOMY;  Surgeon: Daneil Dolin, MD;  Location: [facility];  Service: Endoscopy;  Laterality: N/A;  . Biliary stent placement N/A 04/23/2015    Procedure: BILIARY STENT PLACEMENT;  Surgeon: Daneil Dolin, MD;  Location: [facility];  Service: Endoscopy;  Laterality: N/A;  . Whipple procedure  August 15th 2016    FAMILY HISTORY: Family History  Problem Relation Age of Onset  . CAD Mother   . Stroke Father   . Stroke Brother   . Breast cancer  Sister    She has 1 brother and 1 sister still living.  She is 1 children out of 12 in total.  Her mother and father both passed from a stroke in their 71's.  Oldest sister passed from cancer, unknown type, at the age of 80 yo.  SOCIAL HISTORY:  reports that she has never smoked. She has never used smokeless tobacco. She reports that she does not drink alcohol or use illicit drugs.  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  She is never married.  She has no children.  However, she has nieces and nephews in the area.  She has 1 brother and 1 sister still living.  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  PERFORMANCE STATUS: The patient's performance status is 1 - Symptomatic but completely ambulatory  ROS Positive for leg swelling, arm swelling 14 point review of systems was performed and is negative except as detailed under history of present illness and above    PHYSICAL EXAM: Most Recent Vital Signs: Vitals - 1 value per visit [id number]  SYSTOLIC [id number]  DIASTOLIC 64  Pulse 81  Temperature 97.8  Respirations 18  Weight (lb) 138  Height   BMI 23.68  VISIT REPORT     General appearance: alert, cooperative, appears stated age,  no distress Thin Head: Normocephalic, without obvious abnormality, atraumatic Eyes: no scleral icterus, PERRL Throat: lips, mucosa, and tongue normal; teeth and gums normal Neck: no adenopathy and supple, symmetrical, trachea midline Lungs: clear to auscultation bilaterally and normal percussion bilaterally Chest/Breast:  Right breast still swollen and firm, bilateral breast edema Heart: regular rate and rhythm, S1, S2 normal, no murmur, click, rub or gallop Abdomen: non-tender; bowel sounds normal; no masses,  no organomegaly  Firm. Extremities: extremities normal, atraumatic, no cyanosis   Pitting edema to thigh 1-2+;  Skin: Skin color, texture, turgor normal. No rashes or lesions Lymph  nodes: Cervical, supraclavicular, and axillary nodes normal. Neurologic: Alert and oriented X 3, In wheelchair  LABORATORY DATA: I have reviewed the labs below.  CBC    Component Value Date/Time   WBC 4.9 11/11/2015 0900   WBC 7.9 10/31/2014  1347   RBC 3.39* 11/11/2015 0900   RBC 2.65* 09/30/2015 1600   RBC 4.17 10/31/2014 1347   HGB 10.6* 11/11/2015 0900   HGB 12.2 10/31/2014 1347   HCT 31.1* 11/11/2015 0900   HCT 38.4 10/31/2014 1347   PLT 240 11/11/2015 0900   PLT 218 10/31/2014 1347   MCV 91.7 11/11/2015 0900   MCV 92 10/31/2014 1347   MCH 31.3 11/11/2015 0900   MCH 29.2 10/31/2014 1347   MCHC 34.1 11/11/2015 0900   MCHC 31.8* 10/31/2014 1347   RDW 15.9* 11/11/2015 0900   RDW 13.8 10/31/2014 1347   LYMPHSABS 1.5 11/11/2015 0900   LYMPHSABS 2.4 10/31/2014 1347   MONOABS 0.4 11/11/2015 0900   MONOABS 0.6 10/31/2014 1347   EOSABS 0.0 11/11/2015 0900   EOSABS 0.1 10/31/2014 1347   BASOSABS 0.0 11/11/2015 0900   BASOSABS 0.0 10/31/2014 1347   CMP     Component Value Date/Time   NA 139 11/20/2015 1327   NA 142 10/31/2014 1347   K 3.7 11/20/2015 1327   K 3.5 10/31/2014 1347   CL 107 11/20/2015 1327   CL 108* 10/31/2014 1347   CO2 28 11/20/2015 1327   CO2 26 10/31/2014 1347   GLUCOSE 98 11/20/2015 1327   GLUCOSE 89 10/31/2014 1347   BUN 18 11/20/2015 1327   BUN 19* 10/31/2014 1347   CREATININE 1.67* 11/20/2015 1327   CREATININE 0.95 01/23/2015 1452   CREATININE 1.10 10/31/2014 1347   CALCIUM 7.5* 11/20/2015 1327   CALCIUM 9.3 10/31/2014 1347   PROT 5.1* 10/22/2015 0946   PROT 7.6 10/31/2014 1347   ALBUMIN 1.6* 10/22/2015 0946   ALBUMIN 3.6 10/31/2014 1347   AST 31 10/22/2015 0946   AST 35 10/31/2014 1347   ALT 28 10/22/2015 0946   ALT 26 10/31/2014 1347   ALKPHOS 80 10/22/2015 0946   ALKPHOS 88 10/31/2014 1347   BILITOT 0.6 10/22/2015 0946   BILITOT 0.4 10/31/2014 1347   GFRNONAA 28* 11/20/2015 1327   GFRNONAA 51* 10/31/2014 1347   GFRAA 32*  11/20/2015 1327   GFRAA >60 10/31/2014 1347    Results for MARABELLE, MALTOS (MRN [id number])   Ref. Range 04/17/2015 08:57 07/18/2015 08:52  CA 19-9 Latest Ref Range: 0-35 U/mL 701 (H) 49 (H)    PATHOLOGY:     ASSESSMENT/PLAN:  Adenocarcinoma of the pancreas, T3 N0 M0, positive uncinate margin Whipple procedure with Dr. Eugenia Pancoast on 06/05/2015 Weight loss Lower extremity edema Intolerance to chemotherapy Hypoalbuminemia Anasarca Poor PS  She has not done well since surgery. Initially she made some improvements and was eventually started on adjuvant gemzar but only received 2 treatments. She has developed significant edema but has been off therapy.  She continues to reside at the [facility]. Realistically she is not a candidate for any further therapy. The status of her malignancy is not known and I would not recommend re-imaging at this point.  I have started her on pancreatic enzymes again to see if this helps with her diarrhea. .  She will return in a few weeks.  We will repeat a CBC and CA 19-9  when she returns.  Orders Placed This Encounter  Procedures  . CBC    Standing Status: Future     Number of Occurrences:      Standing Expiration Date: 11/20/2016  . Cancer antigen 19-9    Standing Status: Future     Number of Occurrences:      Standing Expiration Date: 11/20/2016  All questions were answered. The patient knows to call the clinic with any problems, questions or concerns. We can certainly see the patient much sooner if necessary.  This document serves as a record of services personally performed by Ancil Linsey, MD. It was created on her behalf by Toni Amend, a trained medical scribe. The creation of this record is based on the scribe's personal observations and the provider's statements to them. This document has been checked and approved by the attending provider.  I have reviewed the above documentation for accuracy and completeness and I agree with the  above.  This note is electronically signed by  Kelby Fam. Whitney Muse, MD

## 2015-11-23 ENCOUNTER — Encounter: Payer: Self-pay | Admitting: Internal Medicine

## 2015-11-23 ENCOUNTER — Non-Acute Institutional Stay (SKILLED_NURSING_FACILITY): Payer: Medicare Other | Admitting: Internal Medicine

## 2015-11-23 DIAGNOSIS — N183 Chronic kidney disease, stage 3 unspecified: Secondary | ICD-10-CM

## 2015-11-23 DIAGNOSIS — E8809 Other disorders of plasma-protein metabolism, not elsewhere classified: Secondary | ICD-10-CM

## 2015-11-23 DIAGNOSIS — R600 Localized edema: Secondary | ICD-10-CM | POA: Diagnosis not present

## 2015-11-27 ENCOUNTER — Encounter (HOSPITAL_COMMUNITY)
Admission: AD | Admit: 2015-11-27 | Discharge: 2015-11-27 | Disposition: A | Discharge status: Home / Self Care | Payer: Medicare Other | Source: Skilled Nursing Facility | Attending: Internal Medicine | Admitting: Internal Medicine

## 2015-11-27 ENCOUNTER — Encounter (HOSPITAL_COMMUNITY)
Admission: RE | Admit: 2015-11-27 | Discharge: 2015-11-27 | Disposition: A | Discharge status: Home / Self Care | Payer: Medicare Other | Source: Skilled Nursing Facility | Attending: Internal Medicine | Admitting: Internal Medicine

## 2015-11-27 DIAGNOSIS — I10 Essential (primary) hypertension: Secondary | ICD-10-CM | POA: Diagnosis not present

## 2015-11-27 LAB — BASIC METABOLIC PANEL
ANION GAP: 5 (ref 5–15)
BUN: 25 mg/dL — AB (ref 6–20)
CALCIUM: 7.5 mg/dL — AB (ref 8.9–10.3)
CO2: 26 mmol/L (ref 22–32)
CREATININE: 1.62 mg/dL — AB (ref 0.44–1.00)
Chloride: 108 mmol/L (ref 101–111)
GFR calc Af Amer: 33 mL/min — ABNORMAL LOW (ref >60)
GFR, EST NON AFRICAN AMERICAN: 29 mL/min — AB (ref >60)
GLUCOSE: 152 mg/dL — AB (ref 65–99)
Potassium: 3.9 mmol/L (ref 3.5–5.1)
Sodium: 139 mmol/L (ref 135–145)

## 2015-11-27 LAB — CBC
HCT: 26.8 % — ABNORMAL LOW (ref 36.0–46.0)
HEMOGLOBIN: 9.2 g/dL — AB (ref 12.0–15.0)
MCH: 31.8 pg (ref 26.0–34.0)
MCHC: 34.3 g/dL (ref 30.0–36.0)
MCV: 92.7 fL (ref 78.0–100.0)
PLATELETS: 216 10*3/uL (ref 150–400)
RBC: 2.89 MIL/uL — AB (ref 3.87–5.11)
RDW: 16.5 % — ABNORMAL HIGH (ref 11.5–15.5)
WBC: 4.8 10*3/uL (ref 4.0–10.5)

## 2015-11-27 LAB — ALBUMIN: ALBUMIN: 1.3 g/dL — AB (ref 3.5–5.0)

## 2015-11-27 NOTE — Progress Notes (Addendum)
Patient ID: Veronica Stevenson, female   DOB: [birth year], 82 y.o.   MRN: [id number]                 PROGRESS NOTE  DATE:  11/23/2015          FACILITY: [facility]                    LEVEL OF CARE:   SNF   Acute Visit                        CHIEF COMPLAINT:  Follow up massive edema.      HISTORY OF PRESENT ILLNESS:  I note that this patient has been seen by our service this week in follow-up for anasarca essentially.    I note that she has also been seen by Dr. Whitney Muse.  She managed to pick up on a history of diarrhea, which is something I have asked about but never been told.  She has been given Imodium on two different occasions this month, although the patient states she is having liquid bowel movements mostly at night.    She had a Whipple procedure done at [facility].    She has profound hypoalbuminemia which was worked up at [facility] with an echocardiogram, sonograms of her IVC and external iliac veins, bilateral duplex ultrasounds, etc.  No cause of this was ever really determined.  The only thing I have been able to find here are profoundly low albumin levels at 1.6, last checked on 10/22/2015.    REVIEW OF SYSTEMS:    CHEST/RESPIRATORY:  The patient is not complaining of shortness of breath.   CARDIAC:  No chest pain.   GI:  The diarrhea now seems to come out of the historical woodwork.  I had considered placing her on Creon empirically.  I note that Dr. Whitney Muse has done just that.  I had also considered doing a qualitative stool for fecal fat and it does not appear that I ordered that, either; probably because I could not get a malabsorption history.  In any case, I have ordered that today.    PHYSICAL EXAMINATION:   GENERAL APPEARANCE:  The patient appears to have lost weight.  Most recent weight I see is today at 131 pounds.  This appears to be down from 136 pounds on 11/10/2015 and 137 pounds on 10/31/2015.   CHEST/RESPIRATORY:  Exam is clear.   CARDIOVASCULAR:     CARDIAC:  Heart sounds are normal.  JVP is not elevated.      GASTROINTESTINAL:   LIVER/SPLEEN/KIDNEYS:  No liver, no spleen.  No tenderness.     CIRCULATION:   EDEMA/VARICOSITIES:  Extremities:  Massive edema which goes right up into her flanks and abdomen.  Massive coccyx edema.    ASSESSMENT/PLAN:                  Edema.  I currently have her on Lasix 80 b.i.d.  I wonder whether Demadex might be a better choice.  I have ordered a qualitative stool for fecal fat, which should be positive if she is malabsorbing to this degree.  Even though she is on Creon for a short period, I think this probably should still be positive.  I had previously checked her for a protein-losing nephropathy.   This was not positive (protein:creatinine ratio).   She has had all relevant work-up including an echocardiogram, sonograms, duplex ultrasounds for DVT.  I still  am thinking that we may start having to retread over some of these areas that were not positive previously.  Her last echocardiogram on 09/15/2015 revealed an EF of [id number], grade 1 diastolic dysfunction, mild tricuspid regurgitation, a PA pressure of 39, but normal right ventricular systolic function.

## 2015-12-02 ENCOUNTER — Encounter (HOSPITAL_BASED_OUTPATIENT_CLINIC_OR_DEPARTMENT_OTHER): Payer: Medicare Other

## 2015-12-02 ENCOUNTER — Encounter (HOSPITAL_COMMUNITY): Payer: Medicare Other

## 2015-12-02 DIAGNOSIS — N183 Chronic kidney disease, stage 3 (moderate): Secondary | ICD-10-CM | POA: Diagnosis not present

## 2015-12-02 DIAGNOSIS — D631 Anemia in chronic kidney disease: Secondary | ICD-10-CM | POA: Diagnosis not present

## 2015-12-02 LAB — FECAL FAT, QUALITATIVE
FAT QUAL NEUTRAL STL: INCREASED
FAT QUAL TOTAL STL: INCREASED

## 2015-12-02 MED ORDER — DARBEPOETIN ALFA 60 MCG/0.3ML IJ SOSY
60.0000 ug | PREFILLED_SYRINGE | Freq: Once | INTRAMUSCULAR | Status: AC
Start: 2015-12-02 — End: 2015-12-02
  Administered 2015-12-02: 60 ug via SUBCUTANEOUS
  Filled 2015-12-02: qty 0.3

## 2015-12-02 NOTE — Patient Instructions (Signed)
[place] at [facility] Discharge Instructions  RECOMMENDATIONS MADE BY THE CONSULTANT AND ANY TEST RESULTS WILL BE SENT TO YOUR REFERRING PHYSICIAN.  [name] today. Please see MD appointment AVS for more information.    Thank you for choosing [place] at [facility] to provide your oncology and hematology care.  To afford each patient quality time with our provider, please arrive at least 15 minutes before your scheduled appointment time.   Beginning January 23rd 2017 lab work for the [facility] will be done in the  Main lab at [facility] on 1st floor. If you have a lab appointment with the [facility] please come in thru the  Main Entrance and check in at the main information desk  You need to re-schedule your appointment should you arrive 10 or more minutes late.  We strive to give you quality time with our providers, and arriving late affects you and other patients whose appointments are after yours.  Also, if you no show three or more times for appointments you may be dismissed from the clinic at the providers discretion.     Again, thank you for choosing [facility].  Our hope is that these requests will decrease the amount of time that you wait before being seen by our physicians.       _____________________________________________________________  Should you have questions after your visit to [facility], please contact our office at [phone] between the hours of 8:30 a.m. and 4:30 p.m.  Voicemails left after 4:30 p.m. will not be returned until the following business day.  For prescription refill requests, have your pharmacy contact our office.

## 2015-12-02 NOTE — Progress Notes (Signed)
Port not flushed today because we had lab results that were within dates applicable for [name] injection.  Please see other encounter for documentation.

## 2015-12-02 NOTE — Progress Notes (Signed)
Veronica Stevenson presents today for injection per MD orders. [name] 22mcg administered SQ in left Abdomen. Administration without incident. Patient tolerated well.

## 2015-12-11 ENCOUNTER — Encounter (HOSPITAL_COMMUNITY)
Admission: RE | Admit: 2015-12-11 | Discharge: 2015-12-11 | Disposition: A | Discharge status: Home / Self Care | Payer: Medicare Other | Source: Other Acute Inpatient Hospital | Attending: Internal Medicine | Admitting: Internal Medicine

## 2015-12-11 DIAGNOSIS — R262 Difficulty in walking, not elsewhere classified: Secondary | ICD-10-CM | POA: Diagnosis not present

## 2015-12-11 DIAGNOSIS — E1165 Type 2 diabetes mellitus with hyperglycemia: Secondary | ICD-10-CM | POA: Insufficient documentation

## 2015-12-11 DIAGNOSIS — N182 Chronic kidney disease, stage 2 (mild): Secondary | ICD-10-CM | POA: Diagnosis not present

## 2015-12-11 DIAGNOSIS — C250 Malignant neoplasm of head of pancreas: Secondary | ICD-10-CM | POA: Diagnosis present

## 2015-12-11 DIAGNOSIS — N183 Chronic kidney disease, stage 3 (moderate): Secondary | ICD-10-CM | POA: Diagnosis not present

## 2015-12-11 DIAGNOSIS — I10 Essential (primary) hypertension: Secondary | ICD-10-CM | POA: Diagnosis present

## 2015-12-11 DIAGNOSIS — M6281 Muscle weakness (generalized): Secondary | ICD-10-CM | POA: Diagnosis not present

## 2015-12-11 DIAGNOSIS — R278 Other lack of coordination: Secondary | ICD-10-CM | POA: Insufficient documentation

## 2015-12-11 LAB — CBC
HEMATOCRIT: 28 % — AB (ref 36.0–46.0)
HEMOGLOBIN: 9.7 g/dL — AB (ref 12.0–15.0)
MCH: 32.7 pg (ref 26.0–34.0)
MCHC: 34.6 g/dL (ref 30.0–36.0)
MCV: 94.3 fL (ref 78.0–100.0)
Platelets: 368 10*3/uL (ref 150–400)
RBC: 2.97 MIL/uL — ABNORMAL LOW (ref 3.87–5.11)
RDW: 18.4 % — ABNORMAL HIGH (ref 11.5–15.5)
WBC: 6.6 10*3/uL (ref 4.0–10.5)

## 2015-12-12 ENCOUNTER — Encounter (HOSPITAL_COMMUNITY): Payer: Medicare Other

## 2015-12-12 ENCOUNTER — Encounter (HOSPITAL_COMMUNITY): Payer: Self-pay | Admitting: Hematology & Oncology

## 2015-12-12 ENCOUNTER — Encounter (HOSPITAL_COMMUNITY): Payer: Medicare Other | Attending: Oncology | Admitting: Hematology & Oncology

## 2015-12-12 VITALS — BP 107/58 | HR 90 | Temp 98.0°F | Resp 18 | Wt 115.0 lb

## 2015-12-12 DIAGNOSIS — C250 Malignant neoplasm of head of pancreas: Secondary | ICD-10-CM | POA: Diagnosis present

## 2015-12-12 DIAGNOSIS — K838 Other specified diseases of biliary tract: Secondary | ICD-10-CM | POA: Insufficient documentation

## 2015-12-12 DIAGNOSIS — D649 Anemia, unspecified: Secondary | ICD-10-CM | POA: Diagnosis not present

## 2015-12-12 DIAGNOSIS — R634 Abnormal weight loss: Secondary | ICD-10-CM | POA: Diagnosis not present

## 2015-12-12 DIAGNOSIS — N189 Chronic kidney disease, unspecified: Secondary | ICD-10-CM | POA: Diagnosis not present

## 2015-12-12 LAB — CANCER ANTIGEN 19-9: CA 19 9: 402 U/mL — AB (ref 0–35)

## 2015-12-12 NOTE — Patient Instructions (Addendum)
[place] at [facility] Discharge Instructions  RECOMMENDATIONS MADE BY THE CONSULTANT AND ANY TEST RESULTS WILL BE SENT TO YOUR REFERRING PHYSICIAN.   Exam and discussion by Dr Whitney Muse today CT scans scheduled  Continue taking creon as prescribed Return to see the doctor after scans  Please call the clinic if you have any questions or concerns     Thank you for choosing [place] at [facility] to provide your oncology and hematology care.  To afford each patient quality time with our provider, please arrive at least 15 minutes before your scheduled appointment time.   Beginning January 23rd 2017 lab work for the [facility] will be done in the  Main lab at [facility] on 1st floor. If you have a lab appointment with the [facility] please come in thru the  Main Entrance and check in at the main information desk  You need to re-schedule your appointment should you arrive 10 or more minutes late.  We strive to give you quality time with our providers, and arriving late affects you and other patients whose appointments are after yours.  Also, if you no show three or more times for appointments you may be dismissed from the clinic at the providers discretion.     Again, thank you for choosing [facility].  Our hope is that these requests will decrease the amount of time that you wait before being seen by our physicians.       _____________________________________________________________  Should you have questions after your visit to [facility], please contact our office at [phone] between the hours of 8:30 a.m. and 4:30 p.m.  Voicemails left after 4:30 p.m. will not be returned until the following business day.  For prescription refill requests, have your pharmacy contact our office.         Resources For Cancer Patients and their Caregivers ? American Cancer Society: Can assist with transportation,  wigs, general needs, runs Look Good Feel Better.        248 617 6379 ? Cancer Care: Provides financial assistance, online support groups, medication/co-pay assistance.  1-800-813-HOPE (815)859-3650) ? [place] Assists [place] cancer patients and their families through emotional , educational and financial support.  816-880-0702 ? [place] DSS Where to apply for food stamps, Medicaid and utility assistance. (607)441-0017 ? [facility]: Transportation to medical appointments. (513)416-1910 ? Social Security Administration: May apply for disability if have a Stage IV cancer. (856)502-1710 406 813 6009 ? [employer], Disability and Transit Services: Assists with nutrition, care and transit needs. 928-689-0825

## 2015-12-12 NOTE — Progress Notes (Signed)
[facility] [place] NOTE  Name: Veronica Stevenson      MRN: [id number]      REFERRING PHYSICIAN:  Manus Rudd, MD  REASON FOR CONSULT:  3 cm Pancreatic mass at head of pancreas   DIAGNOSIS:  Adenocarcinoma of the pancreas Whipple procedure with Dr. Eugenia Pancoast on 06/05/2015 Final pathology with adenocarcinoma, ductal type, moderately differentiated, 3.0 cm and invades into the duodenal muscularis propria. Neoplasm is present at inked uncinate margin and pancreatic margin. All other margins free. No LVI. Positive for perineural invasion. 0/10 LN involved Stage IIA, T3, N0, M0 CT C/A/P on 04/18/2015 with severe intrahepatic/extrahepatic biliary ductal and pancreatic ductal dilatation secondary to 3 cm pancreatic head mass Single Agent Gemzar started on 08/15/2015, patient only received 2 treatments with last on 08/22/2015    Pancreatic cancer ([place])   04/17/2015 Tumor Marker Results for Veronica Stevenson, Veronica Stevenson (MRN [id number]) as of 07/25/2015 09:24  04/17/2015 08:57 CA 19-9: 701 (H)    04/18/2015 Imaging CT abd/pelvis- Severe intrahepatic and extrahepatic biliary ductal and pancreatic ductal dilatation is noted secondary to 3 cm pancreatic head mass consistent with malignancy.   04/23/2015 Pathology Results BILE DUCT BRUSHING (SPECIMEN 1 OF 1 COLLECTED 04/23/2015) NONDIAGNOSTIC MATERIAL.   04/23/2015 Procedure ERCP- Dr. Ardis Hughs   06/05/2015 Surgery Dr. Eugenia Pancoast at [facility] procedure demonstrating a T3N0 pancreatic mass with positive ucinate margin   07/18/2015 Tumor Marker Results for Veronica Stevenson, Veronica Stevenson (MRN [id number]) as of 07/25/2015 09:24  07/18/2015 08:52 CA 19-9: 49 (H)    08/15/2015 - 08/22/2015 Chemotherapy Gemcitabine single agent days 1, 8 every 28 days.   08/26/2015 Procedure Port placed by IR due to poor veinous access.   08/27/2015 Miscellaneous 2 unit PRBC transfusion   08/29/2015 Treatment Plan Change Deletion of day 15 of treatment due to low ANC on cycle 1 Day  15.   09/15/2015 Echocardiogram Left ventricle: The cavity size was normal. Wall thickness was normal. Systolic function was normal. The estimated ejection fraction was in the range of 60% to 65%. Wall motion was normal; there were no regional wall motion abnormalities. Doppler   09/18/2015 - 09/23/2015 Hospital Admission Leg pain, swelling, and fall.  Discharged to [facility]   09/18/2015 Imaging MRI L-spine- Multilevel spondylosis as described with similar alignment to previous CT. There is severe multifactorial spinal stenosis at L4-5. 2. No acute osseous findings or evidence of metastatic disease. 3. Possible paraspinous edema superiorly, po   09/19/2015 Imaging [place] - Sonographic survey of the IVC and iliac veins demonstrates patent distal IVC and bilateral iliac veins.   09/19/2015 Imaging [place] B/L LE- No significant occlusive DVT in either extremity.     HISTORY OF PRESENT ILLNESS:   Veronica Stevenson is a 82 year old woman here today for ongoing follow-up of resected pancreatic cancer. (Stage IIA, T3N0M0)  She had a whipple procedure but unfortunately had a positive margin after surgery.   She received only 2 treatments with Gemzar.  [name] returns to the [facility] alone today. She is again wrapped in blankets and in a wheelchair.  She confirms that, today, her diarrhea is better, stating "everything's better."  She also denies any vomiting. When asked if she is able to get up and move around at all, she says "I am able to get around and move more, with somebody's help."  The swelling in her legs looks better today, and she confirms once more that "everything's  better." She confirms that she is still eating good, that "I pretty much eat everything they put in front of me." She denies any belly pain and states that her diarrhea at night is gone. She also denies any trouble swallowing her pills, and doesn't know of any pain anywhere else. She definitely notes an improvement on her  enzyme pills, but remarks "why am I taking so many?"   CA 19-9 is elevated on recheck:  Results for Veronica Stevenson, Veronica Stevenson (MRN [id number]) as of 12/14/2015 17:55  Ref. Range 04/17/2015 08:57 07/18/2015 08:52 12/11/2015 15:00  CA 19-9 Latest Ref Range: 0-35 U/mL 701 (H) 49 (H) 402 (H)    PAST MEDICAL HISTORY:   Past Medical History  Diagnosis Date  . Essential hypertension   . Hyperlipidemia   . Asthmatic bronchitis   . Chronic pancreatitis ([place])   . Osteoarthritis of knee   . Atypical ductal hyperplasia of right breast   . Vitamin D deficiency   . GERD (gastroesophageal reflux disease)   . Hyperparathyroidism ([place])   . Zollinger-Ellison syndrome   . Carotid artery disease ([place])   . Cancer ([place])   . Spinal stenosis of lumbar region 09/20/2015  . Anemia of chronic renal failure, stage 3 (moderate) 09/19/2015    ALLERGIES: Allergies  Allergen Reactions  . Codeine Itching  . Omeprazole Nausea And Vomiting  . Penicillins Other (See Comments)    "blacked out"      MEDICATIONS: I have reviewed the patient's current medications.    Current Facility-Administered Medications on File Prior to Visit  Medication Dose Route Frequency Provider Last Rate Last Dose  . sodium chloride 0.9 % injection 10 mL  10 mL Intravenous PRN Patrici Ranks, MD   10 mL at 08/29/15 0940   Current Outpatient Prescriptions on File Prior to Visit  Medication Sig Dispense Refill  . acetaminophen (TYLENOL) 500 MG tablet Take 500 mg by mouth every 6 (six) hours as needed for mild pain or moderate pain.    Marland Kitchen atorvastatin (LIPITOR) 10 MG tablet Take 10 mg by mouth daily after lunch.     . Cholecalciferol (VITAMIN D) 2000 UNITS CAPS Take 1 capsule by mouth every morning.     . insulin glargine (LANTUS) 100 UNIT/ML injection Inject 0.1 mLs (10 Units total) into the skin at bedtime.    . lidocaine-prilocaine (EMLA) cream Apply 1 application topically as needed. 30 g 3  . lipase/protease/amylase (CREON) 12000 units CPEP  capsule Take 2 capsules (24,000 Units total) by mouth 3 (three) times daily with meals. 270 capsule 0  . loperamide (IMODIUM A-D) 2 MG tablet Take 2 mg by mouth as needed for diarrhea or loose stools.    . metoCLOPramide (REGLAN) 10 MG tablet Take 1 tablet (10 mg total) by mouth 2 (two) times daily as needed for nausea. And at night 30 tablet 0  . metoprolol succinate (TOPROL-XL) 25 MG 24 hr tablet Take 25 mg by mouth every evening.     Marland Kitchen oxyCODONE (OXY IR/ROXICODONE) 5 MG immediate release tablet Take 1 tablet (5 mg total) by mouth every 6 (six) hours as needed. 120 tablet 0  . potassium chloride (K-DUR) 10 MEQ tablet Take 1 tablet (10 mEq total) by mouth 2 (two) times daily. (Patient taking differently: Take 20 mEq by mouth 2 (two) times daily. )       PAST SURGICAL HISTORY Past Surgical History  Procedure Laterality Date  . Colonoscopy  2009    [place]  . Tonsillectomy    .  Cataract extraction, bilateral Bilateral   . Parathyroidectomy    . Ovarian cyst removal    . Eus N/A 04/17/2015    Jacobs-1.7 cm x 2.5 cm mass in head of pancreas causing dilation of pancreatic duct and common bile duct.  . Ercp N/A 04/23/2015    [id number], heterogeneous,indistinctly bordered mass in head of pancreas  . Sphincterotomy N/A 04/23/2015    Procedure: SPHINCTEROTOMY;  Surgeon: Daneil Dolin, MD;  Location: [facility];  Service: Endoscopy;  Laterality: N/A;  . Biliary stent placement N/A 04/23/2015    Procedure: BILIARY STENT PLACEMENT;  Surgeon: Daneil Dolin, MD;  Location: [facility];  Service: Endoscopy;  Laterality: N/A;  . Whipple procedure  August 15th 2016    FAMILY HISTORY: Family History  Problem Relation Age of Onset  . CAD Mother   . Stroke Father   . Stroke Brother   . Breast cancer Sister    She has 1 brother and 1 sister still living.  She is 1 children out of 12 in total.  Her mother and father both passed from a stroke in their 78's.  Oldest sister passed from cancer, unknown type,  at the age of 80 yo.  SOCIAL HISTORY:  reports that she has never smoked. She has never used smokeless tobacco. She reports that she does not drink alcohol or use illicit drugs.  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  She is never married.  She has no children.  However, she has nieces and nephews in the area.  She has 1 brother and 1 sister still living.  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  PERFORMANCE STATUS: The patient's performance status is 1 - Symptomatic but completely ambulatory  ROS Positive for leg swelling, arm swelling 14 point review of systems was performed and is negative except as detailed under history of present illness and above    PHYSICAL EXAM: Most Recent Vital Signs: Vitals - 1 value per visit [id number]  SYSTOLIC [id number]  DIASTOLIC 58  Pulse 90  Temperature 98  Respirations 18  Weight (lb) 115  Height   BMI 19.73   General appearance: alert, cooperative, appears stated age,  no distress Thin Head: Normocephalic, without obvious abnormality, atraumatic Eyes: no scleral icterus, PERRL Throat: lips, mucosa, and tongue normal; teeth and gums normal Neck: no adenopathy and supple, symmetrical, trachea midline Lungs: clear to auscultation bilaterally and normal percussion bilaterally Chest/Breast:  Right breast still swollen and firm, bilateral breast edema Heart: regular rate and rhythm, S1, S2 normal, no murmur, click, rub or gallop Abdomen: non-tender; bowel sounds normal; no masses,  no organomegaly  Firm. Extremities: extremities normal, atraumatic, no cyanosis   Pitting edema to thigh 1-2+;  Skin: Skin color, texture, turgor normal. No rashes or lesions Lymph nodes: Cervical, supraclavicular, and axillary nodes normal. Neurologic: Alert and oriented X 3, In wheelchair  LABORATORY DATA: I have reviewed the labs below.  Results for Veronica Stevenson, Veronica Stevenson (MRN [id number]) as of 12/14/2015  17:55  Ref. Range 12/11/2015 15:00  WBC Latest Ref Range: 4.0-10.5 K/uL 6.6  RBC Latest Ref Range: 3.87-5.11 MIL/uL 2.97 (L)  Hemoglobin Latest Ref Range: 12.0-15.0 g/dL 9.7 (L)  HCT Latest Ref Range: 36.0-46.0 % 28.0 (L)  MCV Latest Ref Range: 78.0-100.0 fL 94.3  MCH Latest Ref Range: 26.0-34.0 pg 32.7  MCHC Latest Ref Range: 30.0-36.0 g/dL 34.6  RDW Latest Ref Range: 11.5-15.5 %  18.4 (H)  Platelets Latest Ref Range: 150-400 K/uL 368     PATHOLOGY:     ASSESSMENT/PLAN:  Adenocarcinoma of the pancreas, T3 N0 M0, positive uncinate margin Whipple procedure with Dr. Eugenia Pancoast on 06/05/2015 Weight loss Lower extremity edema Intolerance to chemotherapy Hypoalbuminemia Anasarca Poor PS CKD Anemia  She has not done well since surgery. Initially she made some improvements and was eventually started on adjuvant gemzar but only received 2 treatments. She has developed significant edema but has been off therapy.  She continues to reside at the [facility]. Realistically she is not a candidate for any further therapy.   CA 19-9 is rising, her PS is poor. I discussed my concerns in regards to her pancreatic carcinoma today.  Because of her CKD we cannot order imaging with contrast but she is agreeable to imaging without.    For now I would continue with her enzymes. She continues to work with Dr. Dellia Nims at the [facility] is for return post imaging.  Long term prognosis is poor.   Orders Placed This Encounter  Procedures  . CT Abdomen Pelvis Wo Contrast    Standing Status: Future     Number of Occurrences:      Standing Expiration Date: 12/11/2016    Order Specific Question:  Reason for Exam (SYMPTOM  OR DIAGNOSIS REQUIRED)    Answer:  restaging pancreatic cancer    Order Specific Question:  Preferred imaging location?    Answer:  [facility]   All questions were answered. The patient knows to call the clinic with any problems, questions or concerns. We can certainly see  the patient much sooner if necessary.  This document serves as a record of services personally performed by Ancil Linsey, MD. It was created on her behalf by Toni Amend, a trained medical scribe. The creation of this record is based on the scribe's personal observations and the provider's statements to them. This document has been checked and approved by the attending provider.  I have reviewed the above documentation for accuracy and completeness and I agree with the above.  This note is electronically signed by  Kelby Fam. Whitney Muse, MD

## 2015-12-12 NOTE — Progress Notes (Signed)
Port-flushed at [facility] 12/11/2015

## 2015-12-16 ENCOUNTER — Ambulatory Visit (HOSPITAL_COMMUNITY)
Admission: RE | Admit: 2015-12-16 | Discharge: 2015-12-16 | Disposition: A | Discharge status: Home / Self Care | Payer: Medicare Other | Source: Ambulatory Visit | Attending: Hematology & Oncology | Admitting: Hematology & Oncology

## 2015-12-16 DIAGNOSIS — R188 Other ascites: Secondary | ICD-10-CM | POA: Diagnosis not present

## 2015-12-16 DIAGNOSIS — C250 Malignant neoplasm of head of pancreas: Secondary | ICD-10-CM | POA: Diagnosis present

## 2015-12-16 MED ORDER — BARIUM SULFATE 2.1 % PO SUSP
ORAL | Status: AC
  Filled 2015-12-16: qty 2

## 2015-12-17 ENCOUNTER — Non-Acute Institutional Stay (SKILLED_NURSING_FACILITY): Payer: Medicare Other | Admitting: Internal Medicine

## 2015-12-17 DIAGNOSIS — F32A Depression, unspecified: Secondary | ICD-10-CM

## 2015-12-17 DIAGNOSIS — N289 Disorder of kidney and ureter, unspecified: Secondary | ICD-10-CM

## 2015-12-17 DIAGNOSIS — C259 Malignant neoplasm of pancreas, unspecified: Secondary | ICD-10-CM

## 2015-12-17 DIAGNOSIS — R600 Localized edema: Secondary | ICD-10-CM | POA: Diagnosis not present

## 2015-12-17 DIAGNOSIS — F329 Major depressive disorder, single episode, unspecified: Secondary | ICD-10-CM

## 2015-12-17 NOTE — Progress Notes (Signed)
Veronica Rival, NP [address] [place] [address]  Malignant neoplasm of head of pancreas ([place]) - Plan: Cancer antigen 19-9  Anemia of chronic renal failure, stage 3 (moderate) - Plan: CBC, Ferritin, [name] TREATMENT CONDITION, SCHEDULING COMMUNICATION INJECTION, SCHEDULING COMMUNICATION LAB, Darbepoetin Alfa ([name]) injection 60 mcg, Basic metabolic panel, CBC, heparin lock flush 100 unit/mL, sodium chloride flush (NS) 0.9 % injection 10 mL, Basic metabolic panel, CBC, CANCELED: Ferritin  CURRENT THERAPY: Surveillance  INTERVAL HISTORY: ASHAUNTE Stevenson 82 y.o. female returns for followup of Adenocarcinoma of the pancreas (T3N0M0) with positive ucinate margin, S/P whipple procedure by Dr. Eugenia Pancoast at [facility] on 06/05/2015.  She subsequently received only 2 days worth of adjuvant chemotherapy consisting of Gemcitabine single agent 08/15/2015- 08/22/2015 (Day 1 and Day 8 only of cycle 1) and this was discontinued due to complications.  Now with a rising CA 19-9 concerning for relapse of disease but without obvious radiographic findings.    Pancreatic cancer ([place])   04/17/2015 Tumor Marker CA 19-9: 701 (H)    04/18/2015 Imaging CT abd/pelvis- Severe intrahepatic and extrahepatic biliary ductal and pancreatic ductal dilatation is noted secondary to 3 cm pancreatic head mass consistent with malignancy.   04/23/2015 Pathology Results BILE DUCT BRUSHING (SPECIMEN 1 OF 1 COLLECTED 04/23/2015) NONDIAGNOSTIC MATERIAL.   04/23/2015 Procedure ERCP- Dr. Ardis Hughs   06/05/2015 Surgery Dr. Eugenia Pancoast at [facility] procedure demonstrating a T3N0 pancreatic mass with positive ucinate margin   07/18/2015 Tumor Marker CA 19-9: 49 (H)    07/18/2015 Tumor Marker Results for Veronica Stevenson (MRN [id number]) as of 07/25/2015 09:24  07/18/2015 08:52 CA 19-9: 49 (H)    08/15/2015 - 08/22/2015 Chemotherapy Gemcitabine single agent days 1, 8 every 28 days.   08/26/2015 Procedure Port placed by IR due to poor  veinous access.   08/27/2015 Miscellaneous 2 unit PRBC transfusion   08/29/2015 Treatment Plan Change Deletion of day 15 of treatment due to low ANC on cycle 1 Day 15.   09/15/2015 Echocardiogram Left ventricle: The cavity size was normal. Wall thickness was normal. Systolic function was normal. The estimated ejection fraction was in the range of 60% to 65%. Wall motion was normal; there were no regional wall motion abnormalities. Doppler   09/18/2015 - 09/23/2015 Hospital Admission Leg pain, swelling, and fall.  Discharged to [facility]   09/18/2015 Imaging MRI L-spine- Multilevel spondylosis as described with similar alignment to previous CT. There is severe multifactorial spinal stenosis at L4-5. 2. No acute osseous findings or evidence of metastatic disease. 3. Possible paraspinous edema superiorly, po   09/19/2015 Imaging [place] - Sonographic survey of the IVC and iliac veins demonstrates patent distal IVC and bilateral iliac veins.   09/19/2015 Imaging [place] B/L LE- No significant occlusive DVT in either extremity.   12/11/2015 Tumor Marker CA 19-9: 402 (H)    12/16/2015 Imaging CT abd/pelvis- Markedly diminished exam detail due to several factors including lack of IV contrast material, marked anasarca, and ascites. Postoperative changes from Whipple procedure identified. There is pneumobilia compatible with biliary patency...    I personally reviewed and went over laboratory results with the patient.  The results are noted within this dictation.  I personally reviewed and went over radiographic studies with the patient.  The results are noted within this dictation.  CT scan is reviewed with Veronica Stevenson.  Her CT scan does not show obvious relapse/recurrence of disease, however due to her chronic renal disease, contrast could not  be used.  She is educated that her CA 19-9 rise is worrisome for possible impending relapse of disease.Additionally, CT scan was without IV contrast and therefore  suboptimal.  She denies any complaints today. She admits that she is concerned about results that I reviewed with her.   Past Medical History  Diagnosis Date  . Essential hypertension   . Hyperlipidemia   . Asthmatic bronchitis   . Chronic pancreatitis ([place])   . Osteoarthritis of knee   . Atypical ductal hyperplasia of right breast   . Vitamin D deficiency   . GERD (gastroesophageal reflux disease)   . Hyperparathyroidism ([place])   . Zollinger-Ellison syndrome   . Carotid artery disease ([place])   . Cancer ([place])   . Spinal stenosis of lumbar region 09/20/2015  . Anemia of chronic renal failure, stage 3 (moderate) 09/19/2015    has Hypertension; Acute pancreatitis; HLD (hyperlipidemia); Essential hypertension; Hyperglycemia; Dyspepsia; Elevated lipase; Loss of weight; Abdominal pain; Pancreatic cancer ([place]); Leg swelling; CKD (chronic kidney disease), stage III; Coagulopathy ([place]); Anemia of chronic renal failure, stage 3 (moderate); Protein-calorie malnutrition, severe ([place]); Diabetes mellitus with complication ([place]); Spinal stenosis of lumbar region; Bilateral leg edema; and Edema on her problem list.     is allergic to codeine; omeprazole; and penicillins.  Current Facility-Administered Medications on File Prior to Visit  Medication Dose Route Frequency Provider Last Rate Last Dose  . sodium chloride 0.9 % injection 10 mL  10 mL Intravenous PRN Patrici Ranks, MD   10 mL at 08/29/15 0940   Current Outpatient Prescriptions on File Prior to Visit  Medication Sig Dispense Refill  . acetaminophen (TYLENOL) 500 MG tablet Take 500 mg by mouth every 6 (six) hours as needed for mild pain or moderate pain.    Marland Kitchen atorvastatin (LIPITOR) 10 MG tablet Take 10 mg by mouth daily after lunch.     . Cholecalciferol (VITAMIN D) 2000 UNITS CAPS Take 1 capsule by mouth every morning.     Marland Kitchen ELIQUIS 5 MG TABS tablet Take 5 mg by mouth 2 (two) times daily.    . furosemide (LASIX) 80 MG tablet Take 80 mg  by mouth 2 (two) times daily.    . insulin glargine (LANTUS) 100 UNIT/ML injection Inject 0.1 mLs (10 Units total) into the skin at bedtime.    . lidocaine-prilocaine (EMLA) cream Apply 1 application topically as needed. 30 g 3  . lipase/protease/amylase (CREON) 12000 units CPEP capsule Take 2 capsules (24,000 Units total) by mouth 3 (three) times daily with meals. 270 capsule 0  . loperamide (IMODIUM A-D) 2 MG tablet Take 2 mg by mouth as needed for diarrhea or loose stools.    . metoCLOPramide (REGLAN) 10 MG tablet Take 1 tablet (10 mg total) by mouth 2 (two) times daily as needed for nausea. And at night 30 tablet 0  . metoprolol succinate (TOPROL-XL) 25 MG 24 hr tablet Take 25 mg by mouth every evening.     Marland Kitchen oxyCODONE (OXY IR/ROXICODONE) 5 MG immediate release tablet Take 1 tablet (5 mg total) by mouth every 6 (six) hours as needed. 120 tablet 0  . potassium chloride (K-DUR) 10 MEQ tablet Take 1 tablet (10 mEq total) by mouth 2 (two) times daily. (Patient taking differently: Take 20 mEq by mouth 2 (two) times daily. )      Past Surgical History  Procedure Laterality Date  . Colonoscopy  2009    [place]  . Tonsillectomy    . Cataract extraction, bilateral  Bilateral   . Parathyroidectomy    . Ovarian cyst removal    . Eus N/A 04/17/2015    Jacobs-1.7 cm x 2.5 cm mass in head of pancreas causing dilation of pancreatic duct and common bile duct.  . Ercp N/A 04/23/2015    [id number], heterogeneous,indistinctly bordered mass in head of pancreas  . Sphincterotomy N/A 04/23/2015    Procedure: SPHINCTEROTOMY;  Surgeon: Daneil Dolin, MD;  Location: [facility];  Service: Endoscopy;  Laterality: N/A;  . Biliary stent placement N/A 04/23/2015    Procedure: BILIARY STENT PLACEMENT;  Surgeon: Daneil Dolin, MD;  Location: [facility];  Service: Endoscopy;  Laterality: N/A;  . Whipple procedure  August 15th 2016    Denies any headaches, dizziness, double vision, fevers, chills, night sweats, nausea,  vomiting, diarrhea, constipation, chest pain, heart palpitations, shortness of breath, blood in stool, black tarry stool, urinary pain, urinary burning, urinary frequency, hematuria.   PHYSICAL EXAMINATION  ECOG PERFORMANCE STATUS: 3 - Symptomatic, >50% confined to bed  Filed Vitals:   12/18/15 1106  BP: 115/53  Pulse: 93  Resp: 18    GENERAL:alert, no distress, cachectic, comfortable, cooperative and smiling SKIN: skin color, texture, turgor are normal, no rashes or significant lesions HEAD: Normocephalic, No masses, lesions, tenderness or abnormalities EYES: normal, EOMI, Conjunctiva are pink and non-injected EARS: External ears normal OROPHARYNX:lips, buccal mucosa, and tongue normal and mucous membranes are moist  NECK: supple, trachea midline LYMPH:  no palpable lymphadenopathy BREAST:not examined LUNGS: clear to auscultation  HEART: regular rate & rhythm, port accessed. ABDOMEN:abdomen soft and normal bowel sounds BACK: Back symmetric, no curvature. EXTREMITIES:less then 2 second capillary refill, no cyanosis, positive findings:  edema B/L mid-lower leg edema, 2+ pitting  NEURO: alert & oriented x 3 with fluent speech    LABORATORY DATA: CBC    Component Value Date/Time   WBC 5.2 12/18/2015 1134   WBC 7.9 10/31/2014 1347   RBC 3.29* 12/18/2015 1134   RBC 2.65* 09/30/2015 1600   RBC 4.17 10/31/2014 1347   HGB 10.4* 12/18/2015 1134   HGB 12.2 10/31/2014 1347   HCT 30.6* 12/18/2015 1134   HCT 38.4 10/31/2014 1347   PLT 330 12/18/2015 1134   PLT 218 10/31/2014 1347   MCV 93.0 12/18/2015 1134   MCV 92 10/31/2014 1347   MCH 31.6 12/18/2015 1134   MCH 29.2 10/31/2014 1347   MCHC 34.0 12/18/2015 1134   MCHC 31.8* 10/31/2014 1347   RDW 17.8* 12/18/2015 1134   RDW 13.8 10/31/2014 1347   LYMPHSABS 1.5 11/11/2015 0900   LYMPHSABS 2.4 10/31/2014 1347   MONOABS 0.4 11/11/2015 0900   MONOABS 0.6 10/31/2014 1347   EOSABS 0.0 11/11/2015 0900   EOSABS 0.1 10/31/2014  1347   BASOSABS 0.0 11/11/2015 0900   BASOSABS 0.0 10/31/2014 1347      Chemistry      Component Value Date/Time   NA 132* 12/18/2015 1134   NA 142 10/31/2014 1347   K 4.2 12/18/2015 1134   K 3.5 10/31/2014 1347   CL 102 12/18/2015 1134   CL 108* 10/31/2014 1347   CO2 26 12/18/2015 1134   CO2 26 10/31/2014 1347   BUN 22* 12/18/2015 1134   BUN 19* 10/31/2014 1347   CREATININE 1.32* 12/18/2015 1134   CREATININE 0.95 01/23/2015 1452   CREATININE 1.10 10/31/2014 1347      Component Value Date/Time   CALCIUM 7.4* 12/18/2015 1134   CALCIUM 9.3 10/31/2014 1347   ALKPHOS 80 10/22/2015 0946  ALKPHOS 88 10/31/2014 1347   AST 31 10/22/2015 0946   AST 35 10/31/2014 1347   ALT 28 10/22/2015 0946   ALT 26 10/31/2014 1347   BILITOT 0.6 10/22/2015 0946   BILITOT 0.4 10/31/2014 1347        PENDING LABS:   RADIOGRAPHIC STUDIES:  Ct Abdomen Pelvis Wo Contrast  12/16/2015  CLINICAL DATA:  Pancreatic carcinoma.  Chronic renal failure. EXAM: CT ABDOMEN AND PELVIS WITHOUT CONTRAST TECHNIQUE: Multidetector CT imaging of the abdomen and pelvis was performed following the standard protocol without IV contrast. COMPARISON:  09/19/2015 and 04/18/2015. FINDINGS: Lower chest: There is marked asymmetric elevation of the right hemidiaphragm. Hepatobiliary: Pneumobilia is identified compatible with biliary patency. Within the limitations of noncontrast technique no focal liver abnormality noted. Pancreas: Postoperative changes from Whipple procedure identified. There has been interval atrophy of the tail of pancreas, image 22 of series 2 Spleen: Normal appearance of the spleen. Adrenals/Urinary Tract: The adrenal glands are unremarkable. Normal unenhanced appearance of the right kidney. There is a cyst within the left kidney measuring 1.2 cm, image 31 of series 2. Urinary bladder is difficult to visualize. Stomach/Bowel: The stomach is normal. There is contrast opacification of the small bowel loops. No  pathologic dilatation of the colon. Vascular/Lymphatic: Aortic atherosclerosis noted. No enlarged retroperitoneal or mesenteric adenopathy. No enlarged pelvic or inguinal lymph nodes. Reproductive: The uterus and adnexal structures are unremarkable. Other: There is a marked amount of ascites within the abdomen and pelvis. There is diffuse edema throughout the mesenteric. Marked diffuse body wall edema (anasarca) and diffuse skin thickening is noted. All these factors (as well as lack of IV contrast material) contribute to significantly diminished exam detail. Musculoskeletal: There is spondylosis noted within the lumbar spine. No aggressive lytic or sclerotic bone lesion. Anterolisthesis of L4 on L5 is noted. IMPRESSION: 1. Markedly diminished exam detail due to several factors including lack of IV contrast material, marked anasarca, and ascites. 2. Postoperative changes from Whipple procedure identified. There is pneumobilia compatible with biliary patency. 3. No pathologic dilatation of the bowel loops. 4. Marked amount of ascites. 5. No mass or adenopathy identified at this time. Electronically Signed   By: Kerby Moors M.D.   On: 12/16/2015 17:36     PATHOLOGY:    ASSESSMENT AND PLAN:  Pancreatic cancer ([place]) Adenocarcinoma of the pancreas (T3N0M0) with positive ucinate margin, S/P whipple procedure by Dr. Eugenia Pancoast at [facility] on 06/05/2015.  She subsequently received only 2 days worth of adjuvant chemotherapy consisting of Gemcitabine single agent 08/15/2015- 08/22/2015 (Day 1 and Day 8 only of cycle 1) and this was discontinued due to complications.  Now with a rising CA 19-9 concerning for relapse of disease but without obvious radiographic findings (being mindful that imaging technique was sub-optimal without contrast due to her renal function).  Oncology history is updated.  She is educated that with her rising CA19-9 is concerning for relapse of disease, although it is not radiographically proven  at this time.  CA 19-9 in ~ 4 weeks.  Return in 4 weeks for follow-up.    Anemia of chronic renal failure, stage 3 (moderate) Normocytic, normochromic anemia in the setting of Stage III chronic renal disease.  Most recent HGB is 9.7 g/dL.  On [name] 60 mcg every 2 weeks.  [name] today.  Supportive therapy plan reviewed.  CBC every 2 weeks.   Ferritin every 90 days.  Due in April 2017.    THERAPY PLAN:  Continue with ESA therapy as  planned.  Will need to follow CA 19-9 closely as there is concern for impending relapse/recurrence of pancreatic cancer.  All questions were answered. The patient knows to call the clinic with any problems, questions or concerns. We can certainly see the patient much sooner if necessary.  Patient and plan discussed with Dr. Ancil Linsey and she is in agreement with the aforementioned.   This note is electronically signed by: Robynn Pane, PA-C 12/18/2015 6:01 PM

## 2015-12-17 NOTE — Assessment & Plan Note (Addendum)
Adenocarcinoma of the pancreas (T3N0M0) with positive ucinate margin, S/P whipple procedure by Dr. Eugenia Pancoast at [facility] on 06/05/2015.  She subsequently received only 2 days worth of adjuvant chemotherapy consisting of Gemcitabine single agent 08/15/2015- 08/22/2015 (Day 1 and Day 8 only of cycle 1) and this was discontinued due to complications.  Now with a rising CA 19-9 concerning for relapse of disease but without obvious radiographic findings (being mindful that imaging technique was sub-optimal without contrast due to her renal function).  Oncology history is updated.  She is educated that with her rising CA19-9 is concerning for relapse of disease, although it is not radiographically proven at this time.  CA 19-9 in ~ 4 weeks.  Return in 4 weeks for follow-up.

## 2015-12-17 NOTE — Assessment & Plan Note (Addendum)
Normocytic, normochromic anemia in the setting of Stage III chronic renal disease.  Most recent HGB is 9.7 g/dL.  On [name] 60 mcg every 2 weeks.  [name] today.  Supportive therapy plan reviewed.  CBC every 2 weeks.   Ferritin every 90 days.  Due in April 2017.

## 2015-12-18 ENCOUNTER — Encounter (HOSPITAL_COMMUNITY)
Admission: AD | Admit: 2015-12-18 | Discharge: 2015-12-18 | Disposition: A | Discharge status: Home / Self Care | Payer: Medicare Other | Source: Skilled Nursing Facility | Attending: Internal Medicine | Admitting: Internal Medicine

## 2015-12-18 ENCOUNTER — Encounter (HOSPITAL_BASED_OUTPATIENT_CLINIC_OR_DEPARTMENT_OTHER): Payer: Medicare Other | Admitting: Oncology

## 2015-12-18 ENCOUNTER — Encounter (HOSPITAL_COMMUNITY): Payer: Self-pay | Admitting: Oncology

## 2015-12-18 VITALS — BP 115/53 | HR 93 | Resp 18 | Wt 118.1 lb

## 2015-12-18 DIAGNOSIS — C257 Malignant neoplasm of other parts of pancreas: Secondary | ICD-10-CM

## 2015-12-18 DIAGNOSIS — N183 Chronic kidney disease, stage 3 (moderate): Secondary | ICD-10-CM | POA: Diagnosis not present

## 2015-12-18 DIAGNOSIS — D631 Anemia in chronic kidney disease: Secondary | ICD-10-CM | POA: Diagnosis not present

## 2015-12-18 DIAGNOSIS — C250 Malignant neoplasm of head of pancreas: Secondary | ICD-10-CM | POA: Diagnosis not present

## 2015-12-18 DIAGNOSIS — K838 Other specified diseases of biliary tract: Secondary | ICD-10-CM | POA: Diagnosis present

## 2015-12-18 DIAGNOSIS — R634 Abnormal weight loss: Secondary | ICD-10-CM | POA: Diagnosis present

## 2015-12-18 LAB — CBC
HCT: 30.6 % — ABNORMAL LOW (ref 36.0–46.0)
HEMOGLOBIN: 10.4 g/dL — AB (ref 12.0–15.0)
MCH: 31.6 pg (ref 26.0–34.0)
MCHC: 34 g/dL (ref 30.0–36.0)
MCV: 93 fL (ref 78.0–100.0)
Platelets: 330 10*3/uL (ref 150–400)
RBC: 3.29 MIL/uL — AB (ref 3.87–5.11)
RDW: 17.8 % — ABNORMAL HIGH (ref 11.5–15.5)
WBC: 5.2 10*3/uL (ref 4.0–10.5)

## 2015-12-18 LAB — BASIC METABOLIC PANEL
ANION GAP: 4 — AB (ref 5–15)
BUN: 22 mg/dL — ABNORMAL HIGH (ref 6–20)
CALCIUM: 7.4 mg/dL — AB (ref 8.9–10.3)
CHLORIDE: 102 mmol/L (ref 101–111)
CO2: 26 mmol/L (ref 22–32)
Creatinine, Ser: 1.32 mg/dL — ABNORMAL HIGH (ref 0.44–1.00)
GFR calc non Af Amer: 37 mL/min — ABNORMAL LOW (ref >60)
GFR, EST AFRICAN AMERICAN: 43 mL/min — AB (ref >60)
Glucose, Bld: 97 mg/dL (ref 65–99)
Potassium: 4.2 mmol/L (ref 3.5–5.1)
Sodium: 132 mmol/L — ABNORMAL LOW (ref 135–145)

## 2015-12-18 MED ORDER — HEPARIN SOD (PORK) LOCK FLUSH 100 UNIT/ML IV SOLN
INTRAVENOUS | Status: AC
  Filled 2015-12-18: qty 5

## 2015-12-18 MED ORDER — HEPARIN SOD (PORK) LOCK FLUSH 100 UNIT/ML IV SOLN
500.0000 [IU] | Freq: Once | INTRAVENOUS | Status: AC
  Administered 2015-12-18: 500 [IU] via INTRAVENOUS

## 2015-12-18 MED ORDER — DARBEPOETIN ALFA 60 MCG/0.3ML IJ SOSY
60.0000 ug | PREFILLED_SYRINGE | Freq: Once | INTRAMUSCULAR | Status: AC
  Administered 2015-12-18: 60 ug via SUBCUTANEOUS
  Filled 2015-12-18: qty 0.3

## 2015-12-18 MED ORDER — SODIUM CHLORIDE 0.9% FLUSH
10.0000 mL | INTRAVENOUS | Status: DC | PRN
  Administered 2015-12-18: 10 mL via INTRAVENOUS
  Filled 2015-12-18: qty 10

## 2015-12-18 NOTE — Patient Instructions (Signed)
[place] at [facility] Discharge Instructions  RECOMMENDATIONS MADE BY THE CONSULTANT AND ANY TEST RESULTS WILL BE SENT TO YOUR REFERRING PHYSICIAN.  Exam done and seen by Veronica Stevenson [name] given today per orders. [name] every two weeks. Went over scan results. Follow up in 4weeks with labs Labs every 2 weeks.   Thank you for choosing [place] at [facility] to provide your oncology and hematology care.  To afford each patient quality time with our provider, please arrive at least 15 minutes before your scheduled appointment time.   Beginning January 23rd 2017 lab work for the [facility] will be done in the  Main lab at [facility] on 1st floor. If you have a lab appointment with the [facility] please come in thru the  Main Entrance and check in at the main information desk  You need to re-schedule your appointment should you arrive 10 or more minutes late.  We strive to give you quality time with our providers, and arriving late affects you and other patients whose appointments are after yours.  Also, if you no show three or more times for appointments you may be dismissed from the clinic at the providers discretion.     Again, thank you for choosing [facility].  Our hope is that these requests will decrease the amount of time that you wait before being seen by our physicians.       _____________________________________________________________  Should you have questions after your visit to [facility], please contact our office at [phone] between the hours of 8:30 a.m. and 4:30 p.m.  Voicemails left after 4:30 p.m. will not be returned until the following business day.  For prescription refill requests, have your pharmacy contact our office.         Resources For Cancer Patients and their Caregivers ? American Cancer Society: Can assist with transportation, wigs, general needs, runs Look  Good Feel Better.        423-868-9335 ? Cancer Care: Provides financial assistance, online support groups, medication/co-pay assistance.  1-800-813-HOPE 734-045-4529) ? [place] Assists [place] cancer patients and their families through emotional , educational and financial support.  (215)714-4039 ? [place] DSS Where to apply for food stamps, Medicaid and utility assistance. 985 825 9000 ? [facility]: Transportation to medical appointments. 774-649-3559 ? Social Security Administration: May apply for disability if have a Stage IV cancer. 2312093007 714-407-5629 ? [employer], Disability and Transit Services: Assists with nutrition, care and transit needs. (360)089-4590

## 2015-12-18 NOTE — Progress Notes (Signed)
Veronica Stevenson presents today for injection per MD orders. [name] 4mcg administered SQ in left Abdomen. Administration without incident. Patient tolerated well.   Blood work drawn for port a cath today [name] was already accessed.

## 2015-12-21 ENCOUNTER — Encounter: Payer: Self-pay | Admitting: Internal Medicine

## 2015-12-21 NOTE — Progress Notes (Signed)
Patient ID: Veronica Stevenson, female   DOB: 04/01/1933, 82 y.o.   MRN: [id number]                     FACILITY: [facility]                    LEVEL OF CARE:   SNF   Acute Visit            CHIEF COMPLAINT: Acute visit follow-up edema-family concerns for depression  HISTORY OF PRESENT ILLNESS:   Patient is a pleasant 82 year old female with a complicated medical history including history of metastatic pancreatic cancer which is followed closely by oncology --   She has a profoundly low albumin, on February 16 was 1.3    She also has stage III chronic renal insufficiency with a creatinine in the mid ones most recent 1.62 on lab done February 16   She continues to have significant edema but this appears to have improved and stabilized-her weight actually recently 111 which is down about 20 pounds over the past month.  She currently has her legs elevated she does not complain of any shortness of breath appears to be in good spirits.  However her sister is concerned thinking that times she exhibits depressed feelings-she has had a cancer marker elevated and she is going to talk to oncology tomorrow about this I suspect this is causing her some concern.  She has had a CT scan which does not appear to show any progression of her carcinoma however I did tell her that she will have to speak withoncology for their  opinion on this as well in regards the elevated CA level          Family medical social history reviewed per history and physical on 09/24/2015.  Medications have been reviewed.  Eliquis 5 mg by mouth twice a day   Lipitor 10 mg daily.  OxyIR 5 mg every 6 hours when necessary.  Potassium 20:'s twice a day.  Reglan 10 mg twice a day.  Toprol-XL 25 mg daily at bedtime.  Lasix 80 mg twice a day     REVIEW OF SYSTEMS : General does not complain of fever or chills. Appear she has lost weight and some edema  Skin does not complain of rashes or  itching.  Eyes does not complain of visual changes.  Oropharynx is not complaining of sore throat  Resp-- does not clomplain shortness of breath  . Cardiac no chest pain.--Edema appears somewhat improved today  Musculoskeletal is not complaining of joint pain this time but does have significant weakness.    Neurologic does not complain of dizziness or headache     Psych does not appear overtly depressed or anxious although her sister feels that time she does exhibit this  PHYSICAL EXAMINATION: Temperature 98.7 pulse 86 respirations 20 blood pressure 116/58 weight is 111   general is a pleasant elderly female she appears to be quite frail  But appears to be in decent spirits today she is pleasant talkative interactive  Her skin is warm and dry.  Chest is clear to auscultation there is no labored breathing somewhat diminished air entry at the bases.   Heart is regular rate and rhythm without murmur gallop or rub she continues with significant edema that extends up her legs but this appears to have improved somewhat with a 2+ edema bilaterally---this appears somewhat improved from last exam.   Abdomen is soft nontender  with positive bowel sounds. Abdomen is protuberant there is a well-healed  surgical scar  Muscle skeletal does have some lower extremity weakness again complicated with her edema.  Neurologic grossly intact her speech is clear no lateralizing findings.  Psych she appears alert and oriented pleasant and appropriate.--Talkative today appears to be in good spirits although again sister is noted at times apparently some depressed statements-apparently there is some concern about the cancer marker which is elevated and she will discuss this with oncology tomorrow  Labs.  12/11/2015.  WBC 6.6 hemoglobin 9.7 platelets 368 area  11/27/2015.  Sodium 139 potassium 3.9 BUN 25 creatinine 1.62.      11/17/2015.  Sodium 138 potassium 3.8 BUN 18 creatinine  1.69.  11/11/2015.  Sodium 140 potassium 4.4 BUN 18 creatinine 1.5.  WBC 4.9 hemoglobin 10.6 platelets 240.    10/22/2015.  Sodium 141 potassium 3.9 BUN 14 creatinine 1.26.  Albumin 1.6 otherwise liver function tests within normal limits.  VBC 5.9 hemoglobin 10.7 platelets 258.            ASSESSMENT/PLAN:                                                         History of edema weight gain-this appears to have moderated-she has lost t weight I suspect this is largely edema although she still has significant edema-Will update a metabolic panel to assess her renal function she is on a significant amount of Lasix 80 mg twice a day with potassium                                           # history of renal insufficiency --Baseline creatinine appears to be mid ones most recently 1.62 on lab done on February 16 we will update this as noted above.   Depression?-I do not really see evidence of that today although her sister is concerned will order a psych consult to evaluate-I suspect discussing her status with oncology tomorrow will help -again will continue to monitor--today she is bright interactive denies feeling depressed although she is again concerned about any progression of her cancer  Patient's status was discussed with her sister via phone this afternoon who expressed understanding  [phone]

## 2015-12-21 NOTE — Progress Notes (Signed)
This encounter was created in error - please disregard.

## 2015-12-23 ENCOUNTER — Other Ambulatory Visit: Payer: Self-pay | Admitting: *Deleted

## 2015-12-23 DIAGNOSIS — C250 Malignant neoplasm of head of pancreas: Secondary | ICD-10-CM

## 2015-12-23 MED ORDER — OXYCODONE HCL 5 MG PO TABS: ORAL_TABLET | ORAL | Status: DC

## 2015-12-23 NOTE — Telephone Encounter (Signed)
[facility] 

## 2015-12-27 IMAGING — US US EXTREM  UP VENOUS*R*
1 series · 13 of 24 positions shown · non-contrast
Comparison: None.

CLINICAL DATA: 82-year-old female with right breast and chest
swelling on the right. She has a right-sided port catheter (recently
Underlying pancreatic neoplasm.



[Series 1: us extrem up venous*right* · 0.05mm/px · 13 of 29 slices shown]
[im 1/29]
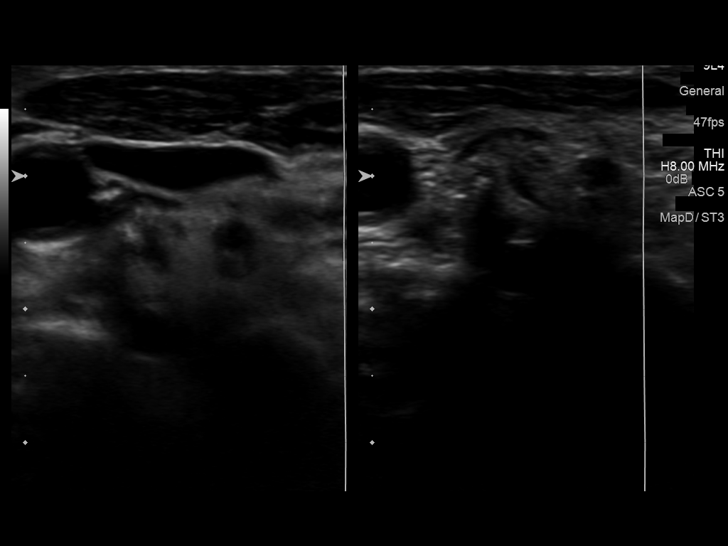
[im 3/29]
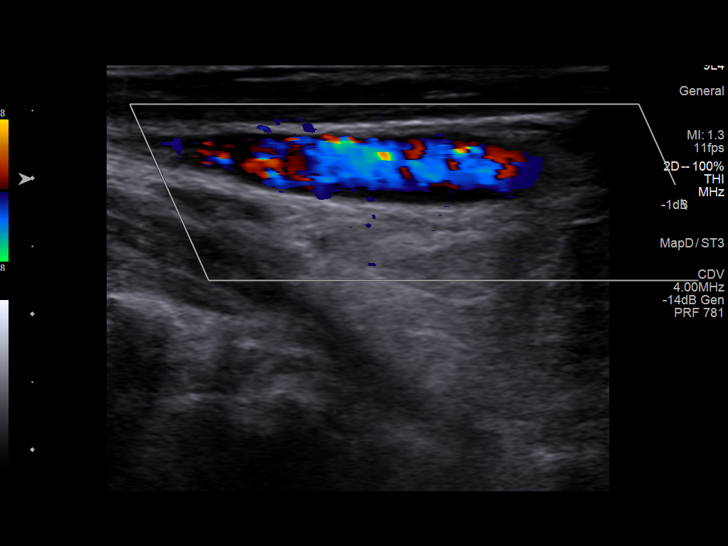
[im 5/29]
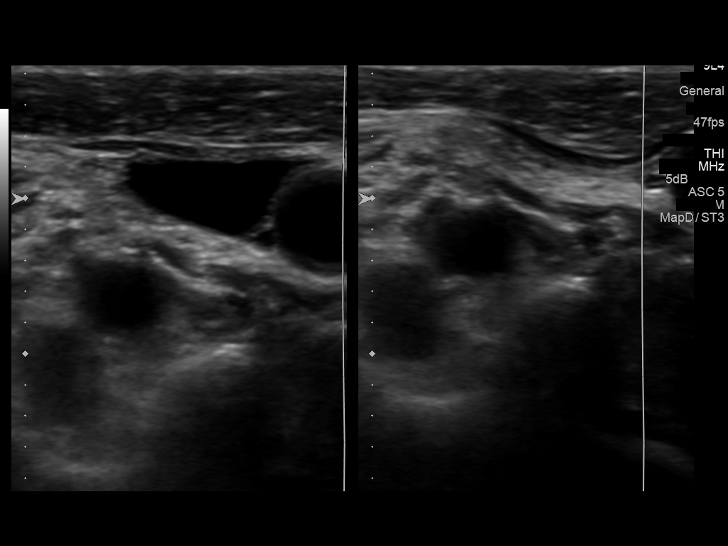
[im 8/29]
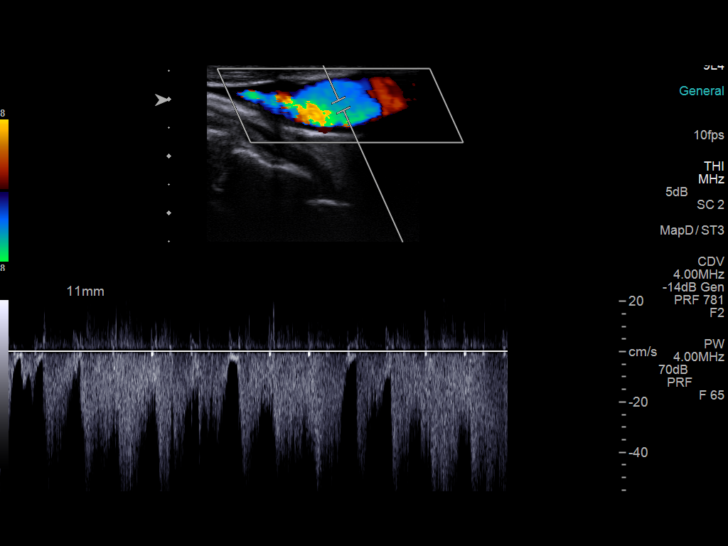
[im 10/29]
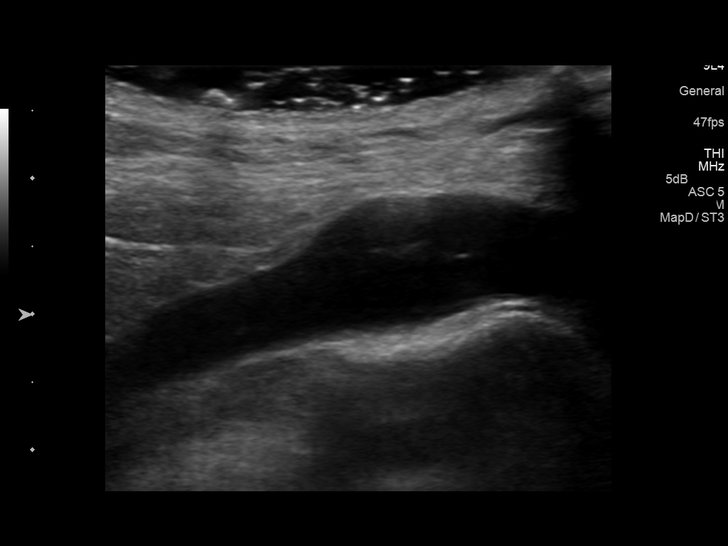
[im 13/29]
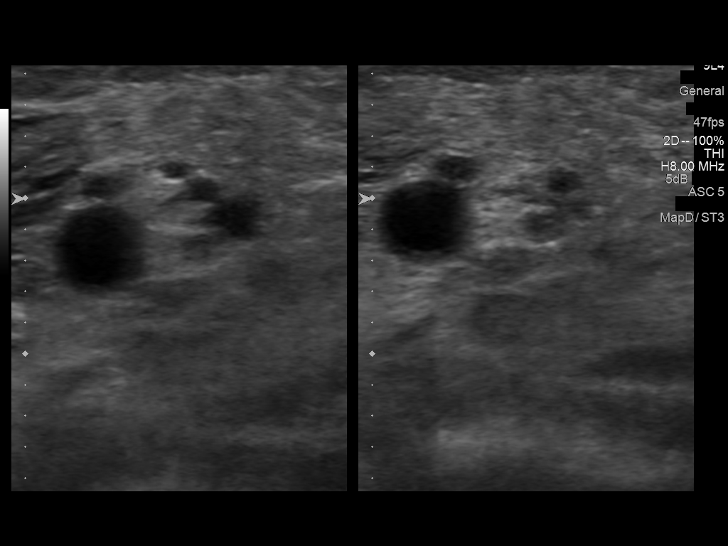
[im 15/29]
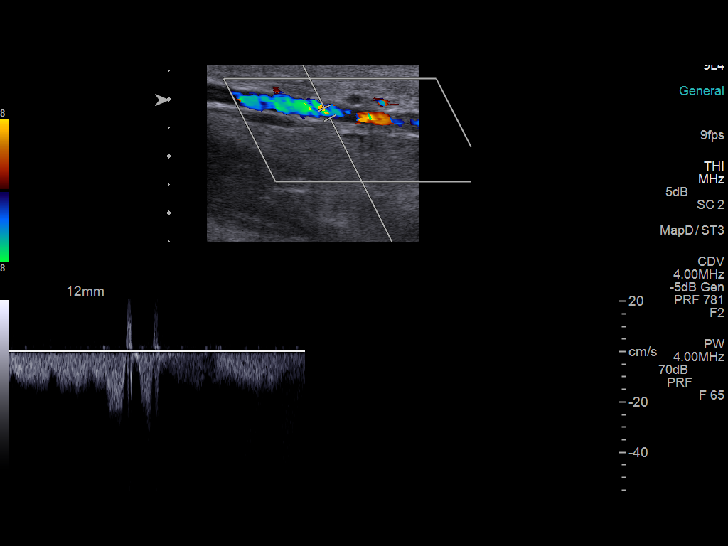
[im 16/29]
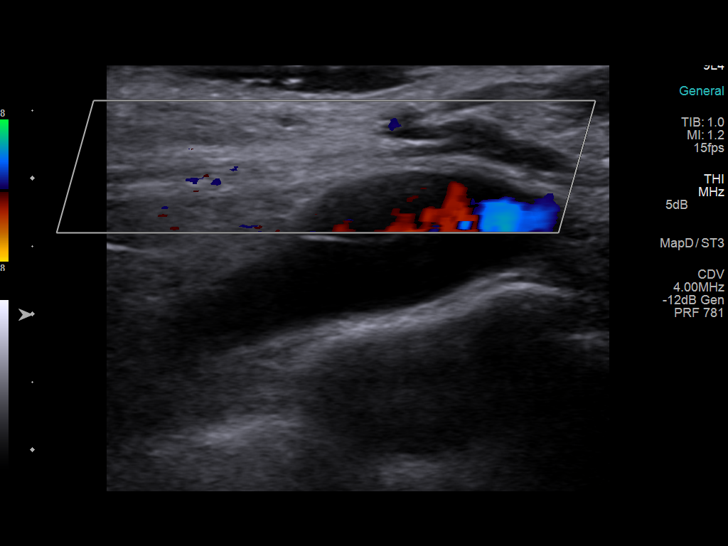
[im 19/29]
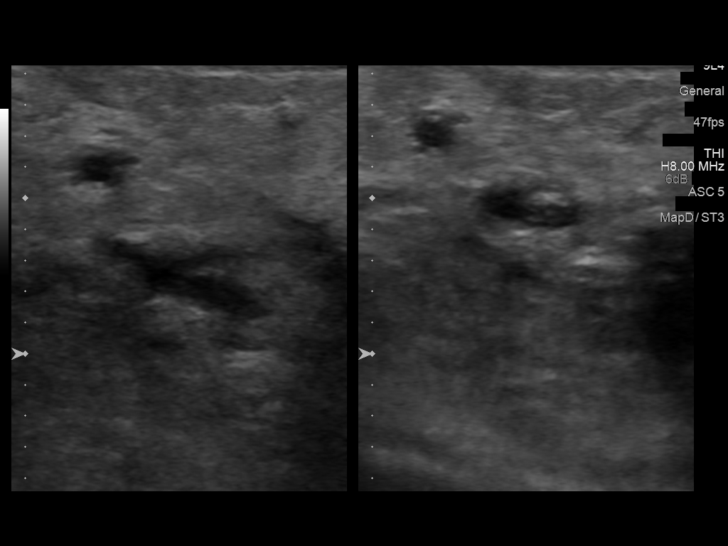
[im 21/29]
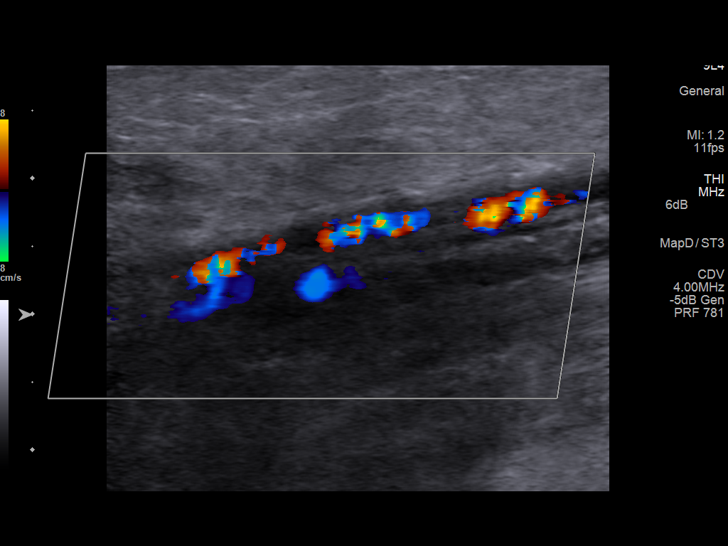
[im 24/29]
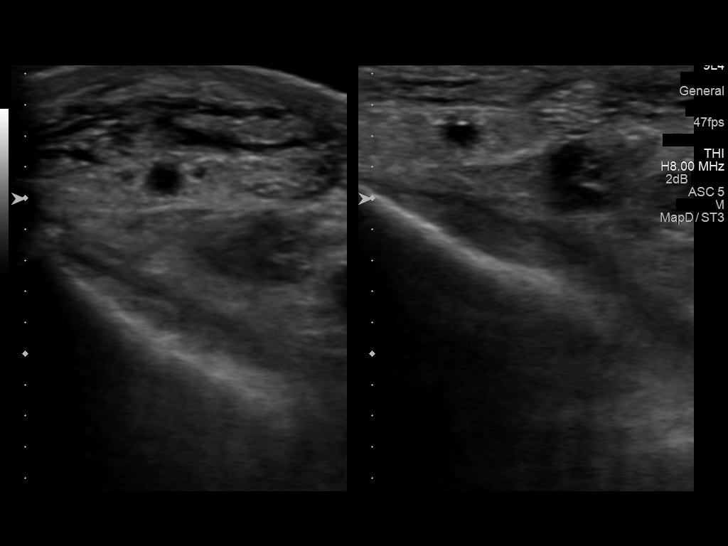
[im 26/29]
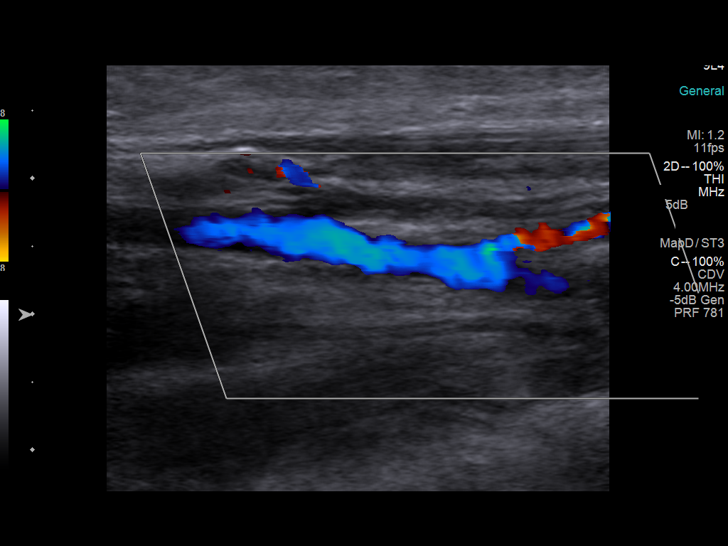
[im 29/29]
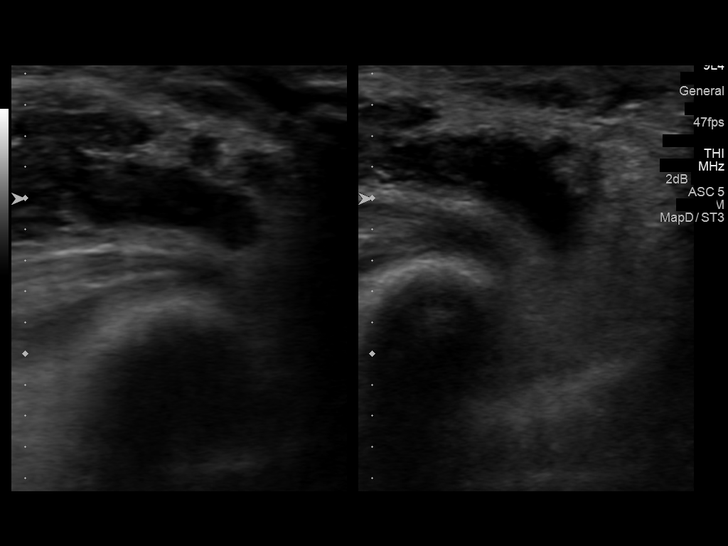

[13 of 24 positions shown; findings below may reference images not displayed]

FINDINGS: Contralateral Subclavian Vein: Respiratory phasicity is normal and
symmetric with the symptomatic side. No evidence of thrombus. Normal
compressibility.

Internal Jugular Vein: No evidence of thrombus. Normal
compressibility, respiratory phasicity and response to augmentation.

Subclavian Vein: No evidence of thrombus. Normal compressibility,
respiratory phasicity and response to augmentation.

Axillary Vein: No evidence of thrombus. Normal compressibility,
respiratory phasicity and response to augmentation.

Cephalic Vein: Noncompressible and containing low-level internal
echoes consistent with acute thrombus. No evidence of vascular flow
on color Doppler imaging. Thrombus extends from the mid arm to the
cephalic arch.

Basilic Vein: No evidence of thrombus. Normal compressibility,
respiratory phasicity and response to augmentation.

Brachial Veins: No evidence of thrombus. Normal compressibility,
respiratory phasicity and response to augmentation.

Radial Veins: No evidence of thrombus. Normal compressibility,
respiratory phasicity and response to augmentation.

Ulnar Veins: No evidence of thrombus. Normal compressibility,
respiratory phasicity and response to augmentation.

Venous Reflux:  None visualized.

Other Findings:  None visualized.
IMPRESSION: 1. Positive for superficial thrombosis of the cephalic vein.
2. No evidence of deep venous thrombosis in the right upper
extremity.

## 2016-01-02 ENCOUNTER — Encounter (HOSPITAL_BASED_OUTPATIENT_CLINIC_OR_DEPARTMENT_OTHER): Payer: Medicare Other

## 2016-01-02 ENCOUNTER — Encounter (HOSPITAL_COMMUNITY): Payer: Medicare Other

## 2016-01-02 ENCOUNTER — Encounter (HOSPITAL_COMMUNITY)
Admission: RE | Admit: 2016-01-02 | Discharge: 2016-01-02 | Disposition: A | Discharge status: Home / Self Care | Payer: Medicare Other | Source: Skilled Nursing Facility | Attending: *Deleted | Admitting: *Deleted

## 2016-01-02 ENCOUNTER — Encounter (HOSPITAL_COMMUNITY): Payer: Self-pay

## 2016-01-02 VITALS — BP 106/80 | HR 94 | Temp 98.0°F | Resp 18

## 2016-01-02 DIAGNOSIS — N183 Chronic kidney disease, stage 3 unspecified: Secondary | ICD-10-CM

## 2016-01-02 DIAGNOSIS — D631 Anemia in chronic kidney disease: Secondary | ICD-10-CM

## 2016-01-02 DIAGNOSIS — C250 Malignant neoplasm of head of pancreas: Secondary | ICD-10-CM | POA: Diagnosis not present

## 2016-01-02 DIAGNOSIS — I10 Essential (primary) hypertension: Secondary | ICD-10-CM | POA: Diagnosis not present

## 2016-01-02 LAB — CBC
HCT: 31.2 % — ABNORMAL LOW (ref 36.0–46.0)
Hemoglobin: 10.6 g/dL — ABNORMAL LOW (ref 12.0–15.0)
MCH: 32.1 pg (ref 26.0–34.0)
MCHC: 34 g/dL (ref 30.0–36.0)
MCV: 94.5 fL (ref 78.0–100.0)
PLATELETS: 294 10*3/uL (ref 150–400)
RBC: 3.3 MIL/uL — AB (ref 3.87–5.11)
RDW: 18.2 % — AB (ref 11.5–15.5)
WBC: 6.5 10*3/uL (ref 4.0–10.5)

## 2016-01-02 LAB — BASIC METABOLIC PANEL WITH GFR
Anion gap: 8 (ref 5–15)
BUN: 28 mg/dL — ABNORMAL HIGH (ref 6–20)
CO2: 24 mmol/L (ref 22–32)
Calcium: 7.5 mg/dL — ABNORMAL LOW (ref 8.9–10.3)
Chloride: 105 mmol/L (ref 101–111)
Creatinine, Ser: 1.38 mg/dL — ABNORMAL HIGH (ref 0.44–1.00)
GFR calc Af Amer: 41 mL/min — ABNORMAL LOW
GFR calc non Af Amer: 35 mL/min — ABNORMAL LOW
Glucose, Bld: 142 mg/dL — ABNORMAL HIGH (ref 65–99)
Potassium: 4.2 mmol/L (ref 3.5–5.1)
Sodium: 137 mmol/L (ref 135–145)

## 2016-01-02 MED ORDER — DARBEPOETIN ALFA 60 MCG/0.3ML IJ SOSY
60.0000 ug | PREFILLED_SYRINGE | Freq: Once | INTRAMUSCULAR | Status: AC
  Administered 2016-01-02: 60 ug via SUBCUTANEOUS

## 2016-01-02 MED ORDER — DARBEPOETIN ALFA 60 MCG/0.3ML IJ SOSY
PREFILLED_SYRINGE | INTRAMUSCULAR | Status: AC
  Filled 2016-01-02: qty 0.3

## 2016-01-02 NOTE — Patient Instructions (Signed)
aranesp today

## 2016-01-02 NOTE — Progress Notes (Signed)
Veronica Stevenson presents today for injection per the provider's orders.  aranesp administration without incident; see MAR for injection details.  Patient tolerated procedure well and without incident.  No questions or complaints noted at this time.

## 2016-01-03 IMAGING — US US EXTREM LOW VENOUS BILAT
1 series · 13 of 24 positions shown · non-contrast
Comparison: None.

CLINICAL DATA: Bilateral lower extremity swelling, pain, recent
fall



[Series 1: us extrem low venous bilat · 0.08mm/px · 13 of 58 slices shown]
[im 1/58]
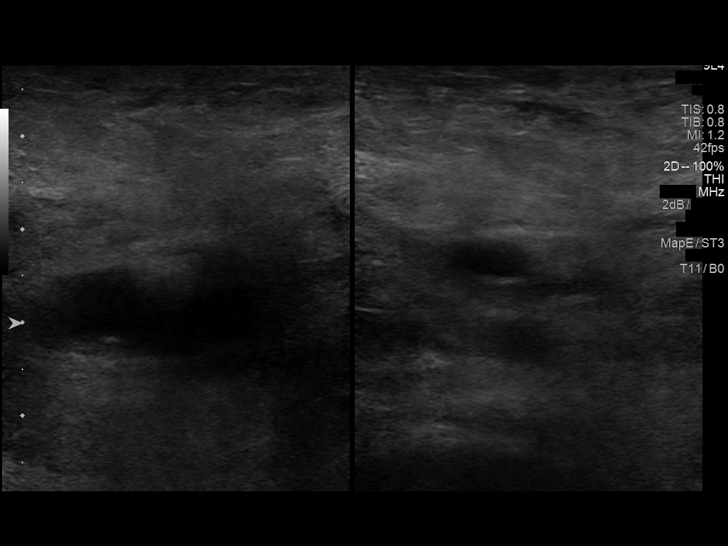
[im 5/58]
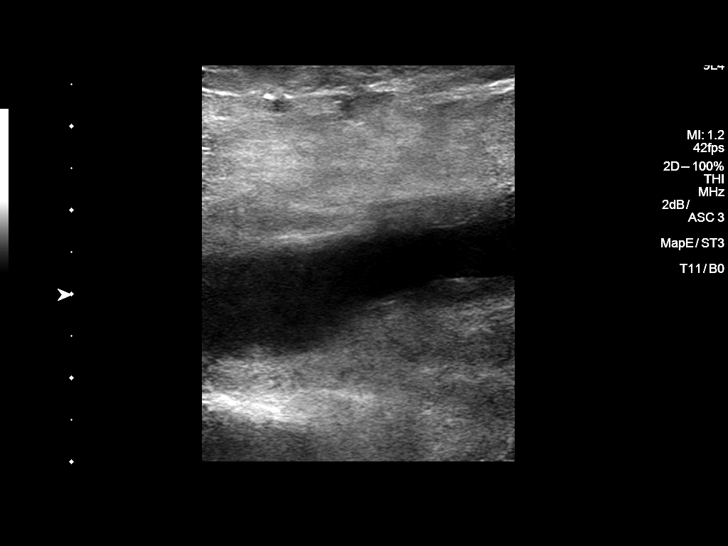
[im 10/58]
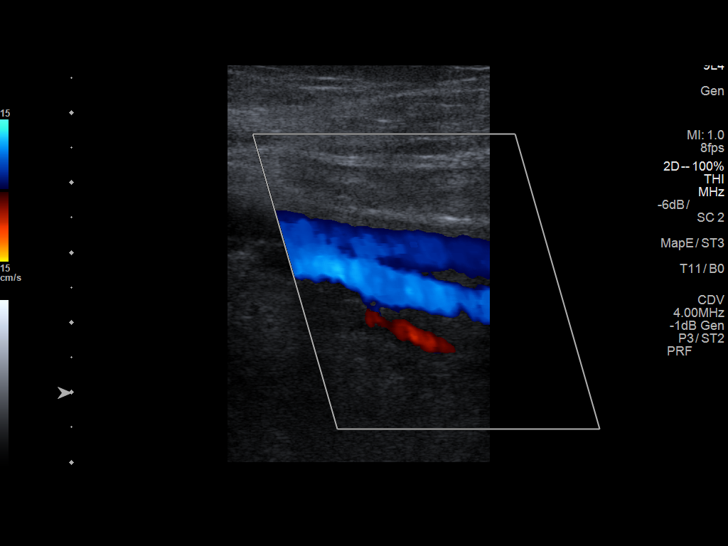
[im 15/58]
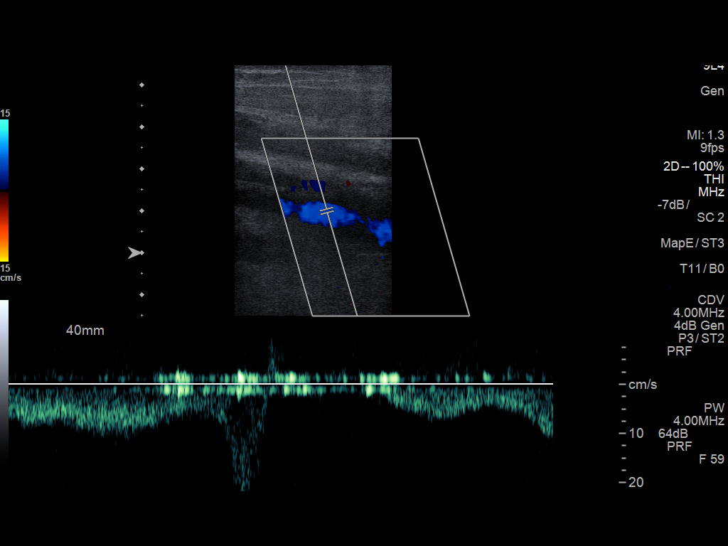
[im 20/58]
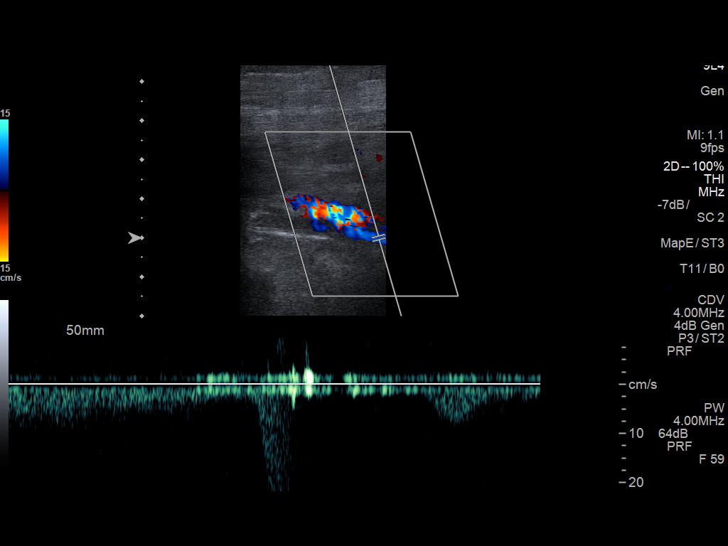
[im 25/58]
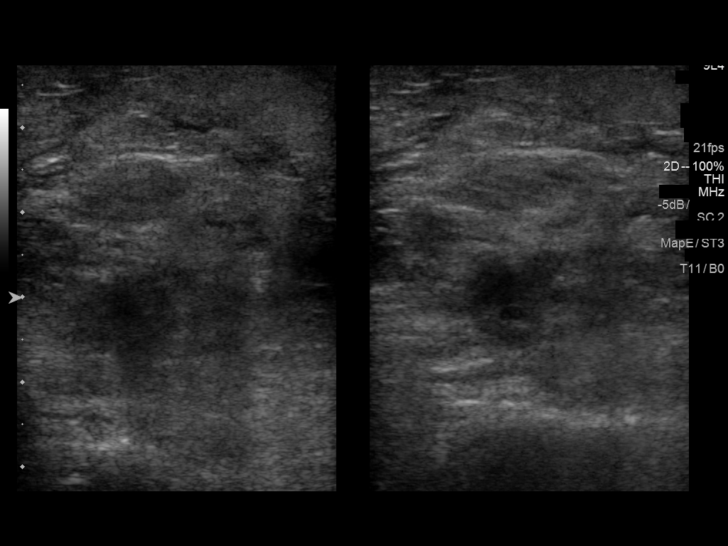
[im 30/58]
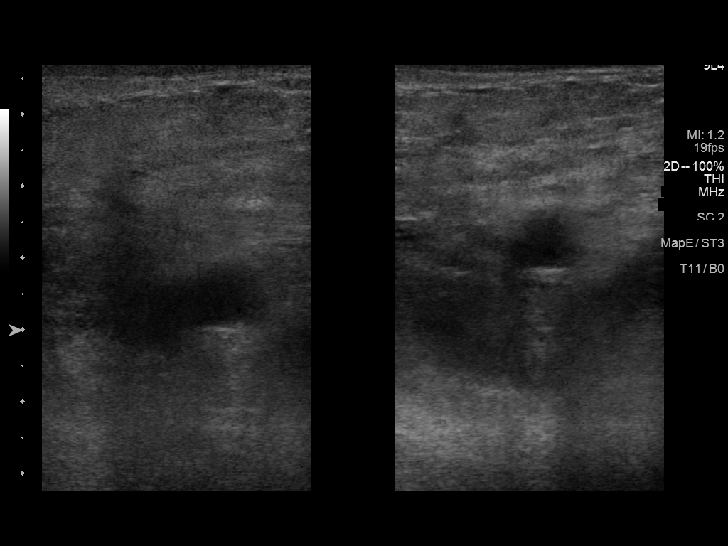
[im 33/58]
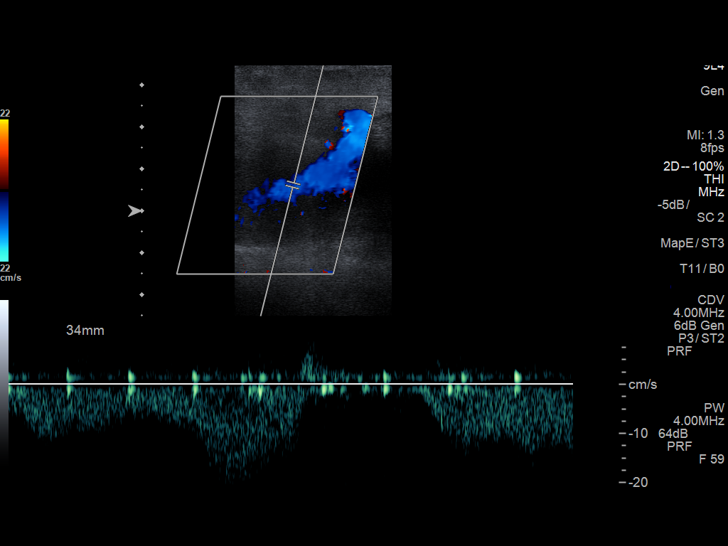
[im 38/58]
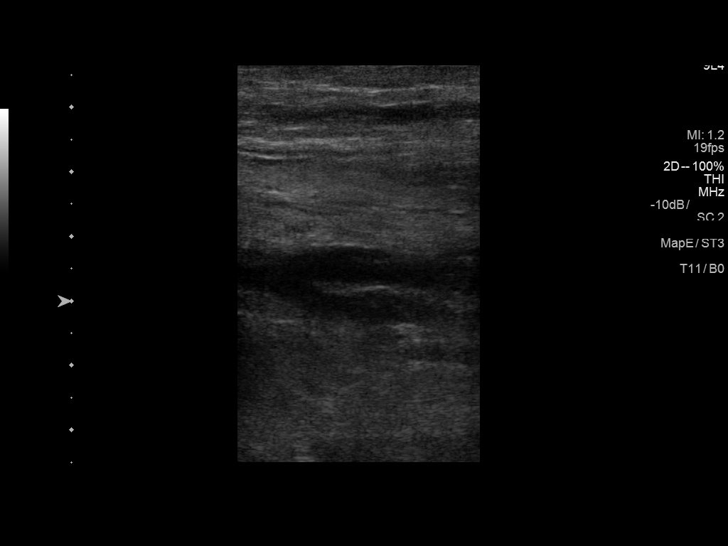
[im 43/58]
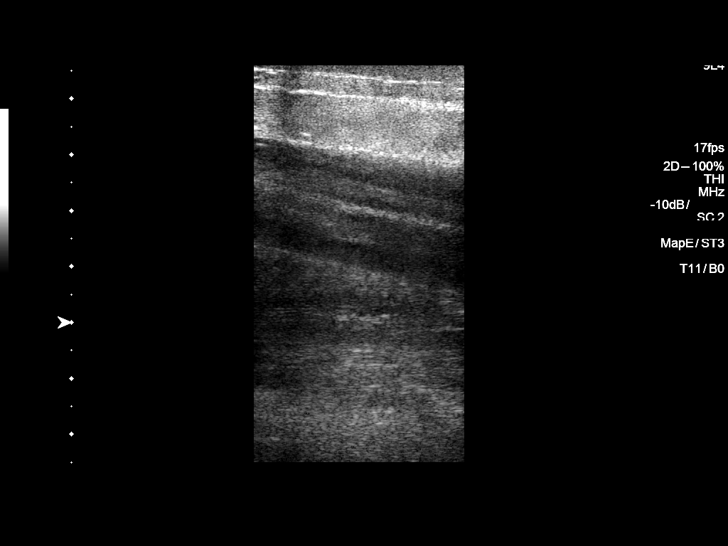
[im 48/58]
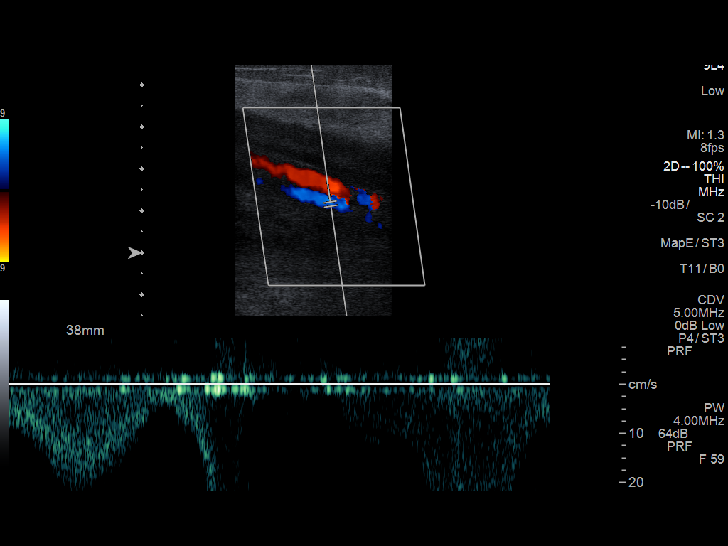
[im 53/58]
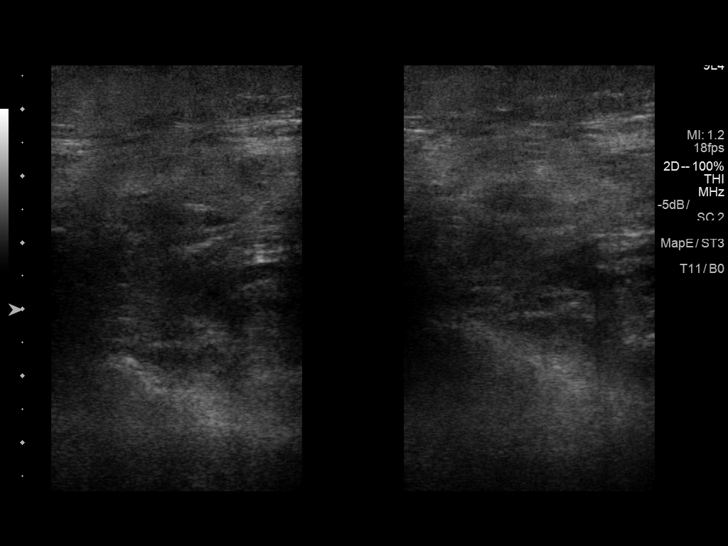
[im 58/58]
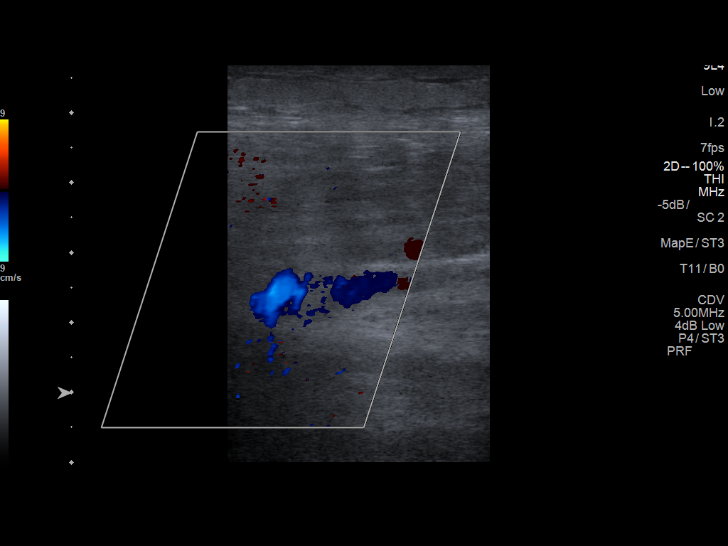

[13 of 24 positions shown; findings below may reference images not displayed]

FINDINGS: Limited exam because of peripheral edema.

RIGHT LOWER EXTREMITY

Common Femoral Vein: No evidence of thrombus. Normal
compressibility, respiratory phasicity and response to augmentation.

Saphenofemoral Junction: No evidence of thrombus. Normal
compressibility and flow on color Doppler imaging.

Profunda Femoral Vein: No evidence of thrombus. Normal
compressibility and flow on color Doppler imaging.

Femoral Vein: No evidence of thrombus. Normal compressibility,
respiratory phasicity and response to augmentation.

Popliteal Vein: No evidence of thrombus. Normal compressibility,
respiratory phasicity and response to augmentation.

Calf Veins: No evidence of thrombus. Normal compressibility and flow
on color Doppler imaging.

Superficial Great Saphenous Vein: No evidence of thrombus. Normal
compressibility and flow on color Doppler imaging.

Venous Reflux:  None.

Other Findings:  Subcutaneous peripheral edema present.

LEFT LOWER EXTREMITY

Common Femoral Vein: No evidence of thrombus. Normal
compressibility, respiratory phasicity and response to augmentation.

Saphenofemoral Junction: No evidence of thrombus. Normal
compressibility and flow on color Doppler imaging.

Profunda Femoral Vein: No evidence of thrombus. Normal
compressibility and flow on color Doppler imaging.

Femoral Vein: No evidence of thrombus. Normal compressibility,
respiratory phasicity and response to augmentation.

Popliteal Vein: No evidence of thrombus. Normal compressibility,
respiratory phasicity and response to augmentation.

Calf Veins: No evidence of thrombus. Normal compressibility and flow
on color Doppler imaging.

Superficial Great Saphenous Vein: No evidence of thrombus. Normal
compressibility and flow on color Doppler imaging.

Venous Reflux:  None.

Other Findings:  Subcutaneous peripheral edema present.
IMPRESSION: No significant occlusive DVT in either extremity.

## 2016-01-15 ENCOUNTER — Encounter (HOSPITAL_COMMUNITY): Payer: Medicare Other | Attending: Hematology & Oncology

## 2016-01-15 ENCOUNTER — Encounter (HOSPITAL_COMMUNITY): Payer: Medicare Other

## 2016-01-15 ENCOUNTER — Encounter (HOSPITAL_COMMUNITY): Payer: Medicare Other | Attending: Oncology | Admitting: Hematology & Oncology

## 2016-01-15 DIAGNOSIS — C250 Malignant neoplasm of head of pancreas: Secondary | ICD-10-CM

## 2016-01-15 DIAGNOSIS — N183 Chronic kidney disease, stage 3 unspecified: Secondary | ICD-10-CM

## 2016-01-15 DIAGNOSIS — K838 Other specified diseases of biliary tract: Secondary | ICD-10-CM | POA: Diagnosis present

## 2016-01-15 DIAGNOSIS — R634 Abnormal weight loss: Secondary | ICD-10-CM | POA: Diagnosis present

## 2016-01-15 DIAGNOSIS — D631 Anemia in chronic kidney disease: Secondary | ICD-10-CM

## 2016-01-15 LAB — CBC
HEMATOCRIT: 38 % (ref 36.0–46.0)
Hemoglobin: 12.7 g/dL (ref 12.0–15.0)
MCH: 31.6 pg (ref 26.0–34.0)
MCHC: 33.4 g/dL (ref 30.0–36.0)
MCV: 94.5 fL (ref 78.0–100.0)
Platelets: 290 10*3/uL (ref 150–400)
RBC: 4.02 MIL/uL (ref 3.87–5.11)
RDW: 18.5 % — AB (ref 11.5–15.5)
WBC: 4.6 10*3/uL (ref 4.0–10.5)

## 2016-01-15 LAB — FERRITIN: Ferritin: 310 ng/mL — ABNORMAL HIGH (ref 11–307)

## 2016-01-15 NOTE — Progress Notes (Signed)
Hemoglobin 12.7. [name] not given, treatment parameters not met.

## 2016-01-22 ENCOUNTER — Encounter: Payer: Self-pay | Admitting: Internal Medicine

## 2016-01-22 NOTE — Progress Notes (Signed)
This encounter was created in error - please disregard. This encounter was created in error - please disregard. This encounter was created in error - please disregard. 

## 2016-01-27 ENCOUNTER — Encounter: Payer: Self-pay | Admitting: Primary Care

## 2016-01-27 NOTE — Progress Notes (Signed)
This encounter was created in error - please disregard.  This encounter was created in error - please disregard.

## 2016-01-27 NOTE — Progress Notes (Unsigned)
Patient ID: Veronica Stevenson, female   DOB: 1933/01/30, 82 y.o.   MRN: [id number]   Daily Progress Note   Patient Name: Veronica Stevenson       Date: 01/27/2016 DOB: 13-Apr-1933  Age: 82 y.o. MRN#: [id number] Attending Physician: No att. providers found Primary Care Physician: [name], NP Admit Date: (Not on file)  Reason for Consultation/Follow-up: Establishing goals of care, Hospice Evaluation and Psychosocial/spiritual support  Subjective: Veronica Stevenson sitting quietly in her room eating her lunch. There is no family at bedside. We talk briefly about her current situation. She denies pain other than occasional pain in her legs. She shares that she receives a pain pill at night and this is effective to help her sleep.  We talk briefly about her [facility] the [facility]. She becomes tearful. She shares that her sister visited yesterday and that they have decided to accept hospice. She does although seem confused about the sale of her home, stating that she didn't know about selling her home. I encourage her that we will do our best to help her through this difficult time.  Length of Stay: none  Current Medications: Scheduled Meds:    Continuous Infusions:   PRN Meds:   Palliative Performance Scale: 40% at best     Vital Signs: There were no vitals taken for this visit. SpO2:   O2 Device:   O2 Flow Rate:    Intake/output summary: @IOBRIEF @ LBM:   Baseline Weight:   Most recent weight:    Physical Exam: Constitutional: Ill appearing, frail, cachectic elderly. She is oriented to person, place, and time and in no distress.   HENT:  Head: Normocephalic and atraumatic. opharyngeal exudate.  Cardiovascular: Normal rate, regular rhythm and normal heart sounds.  swelling to bilateral lower extremities.. Pulmonary/Chest: Effort normal and breath sounds normal.    Abdominal: Soft, no tenderness/guarding.  Skin: Skin is warm and dry. No rash noted.  Psychiatric:  tearful  Nursing note and vitals reviewed.              Additional Data Reviewed: No results for input(s): WBC, HGB, PLT, NA, BUN, CREATININE in the last 72 hours.  Invalid input(s): ALB   Problem List:  Patient Active Problem List   Diagnosis Date Noted  . Spinal stenosis of lumbar region 09/20/2015  . Bilateral leg edema 09/20/2015  . CKD (chronic kidney disease), stage III 09/19/2015  . Coagulopathy ([place]) 09/19/2015  . Anemia of chronic renal failure, stage 3 (moderate) 09/19/2015  . Protein-calorie malnutrition, severe ([place]) 09/19/2015  . Diabetes mellitus with complication ([place]) 0000000  . Pancreatic cancer ([place]) 07/04/2015  . Loss of weight 02/25/2015  . Dyspepsia 01/23/2015  . Acute pancreatitis 01/08/2015  . HLD (hyperlipidemia) 01/08/2015  . Essential hypertension 01/08/2015     Palliative Care Assessment & Plan    Code Status:  DNR  Goals of Care:  Veronica Stevenson continues to state that she would like to return to her own home, but knows this is not safe at this time.  Symptom Management:  Per [employer] [facility] care  Palliative Prophylaxis: Per [employer] care  Psycho-social/Spiritual:  Desire for further Chaplaincy support:yes   Prognosis: < 3 months likely based on decreasing weights, failure to thrive, probable return of cancer. Discharge Planning: Veronica Stevenson will continue to stay in the [facility] skilled nursing facility.   Care plan was discussed with Case manager.   Thank you for allowing the Palliative Medicine Team to assist  in the care of this patient.   Time In: 1300 Time Out: 1315 Total Time 15 Minutes  Prolonged Time Billed  no     Greater than 50%  of this time was spent counseling and coordinating care related to the above assessment and plan.   Drue Novel, NP  01/27/2016, 2:28 PM  Please contact Palliative Medicine Team phone at [phone] for questions and concerns.

## 2016-01-29 ENCOUNTER — Ambulatory Visit (HOSPITAL_COMMUNITY): Payer: Medicare Other

## 2016-01-29 ENCOUNTER — Encounter: Payer: Self-pay | Admitting: Internal Medicine

## 2016-01-29 ENCOUNTER — Other Ambulatory Visit (HOSPITAL_COMMUNITY): Payer: Medicare Other

## 2016-02-03 ENCOUNTER — Encounter: Payer: Self-pay | Admitting: Cardiology

## 2016-02-03 ENCOUNTER — Non-Acute Institutional Stay (SKILLED_NURSING_FACILITY): Payer: Medicare Other | Admitting: Internal Medicine

## 2016-02-03 ENCOUNTER — Encounter: Payer: Self-pay | Admitting: Internal Medicine

## 2016-02-03 DIAGNOSIS — C259 Malignant neoplasm of pancreas, unspecified: Secondary | ICD-10-CM | POA: Diagnosis not present

## 2016-02-03 DIAGNOSIS — R609 Edema, unspecified: Secondary | ICD-10-CM | POA: Diagnosis not present

## 2016-02-03 DIAGNOSIS — E118 Type 2 diabetes mellitus with unspecified complications: Secondary | ICD-10-CM

## 2016-02-03 DIAGNOSIS — N183 Chronic kidney disease, stage 3 unspecified: Secondary | ICD-10-CM

## 2016-02-03 DIAGNOSIS — D631 Anemia in chronic kidney disease: Secondary | ICD-10-CM | POA: Diagnosis not present

## 2016-02-03 DIAGNOSIS — I10 Essential (primary) hypertension: Secondary | ICD-10-CM | POA: Diagnosis not present

## 2016-02-03 NOTE — Progress Notes (Signed)
Patient ID: Veronica Stevenson, female   DOB: August 22, 1933, 82 y.o.   MRN: [id number]  Location:  [place] of Service:  SNF (31)  Veronica Rival, NP  Patient Care Team: Veronica Rival, NP as PCP - General (Nurse Practitioner) Veronica Dolin, MD as Consulting Physician (Gastroenterology)  Extended Emergency Contact Information Primary Emergency Contact: Veronica,Stevenson Address: [address]          [place], [place] [address] Johnnette Litter of [place] Phone: [phone] Relation: Sister Secondary Emergency Contact: Stevenson,Veronica Address: [address] [place] [address] Johnnette Litter of [place] Phone: [phone] Relation: Other  Goals of care: Advanced Directive information Advanced Directives 02/03/2016  Does patient have an advance directive? Yes  Type of Paramedic of [place];Out of facility DNR (pink MOST or yellow form)  Does patient want to make changes to advanced directive? No - Patient declined  Copy of advanced directive(s) in chart? Yes     Chief Complaint  Patient presents with  . Acute Visit  Follow-up lower extremity edema.  Medical management of chronic issues including history of pancreatic  cancer with severe hypoalbuminemia-edema-chronic renal failure-hypertension-  HPI:  Pt is a 82 y.o. female seen today for an acute visit for 4 apparently refusing Lasix with a history of significant lower extremity edema with a history of fairly profound hypoalbuminemia.  Speaking with nursing she appears to great majority of time recently to have refused Lasix staying it makes her urinate too much.  She had been on 80 mg twice a day-appears her weights have slowly trended up weight was 106 pounds at the end of last month is slowly increase it appears most recent weight yesterday was 116.8.  Edema.appears To be relatively baseline possibly slightly increased although significantly better than when she first  arrived here   She does have a history of pancreatic cancer is followed closely by oncology-recent CT scan could not be done with contrast so it was a limited study but did not show progression of the disease however CA 19-9 was elevated which gave suspicions that it may be progressing.  Patient has been seen by palliative care-she has not done well with attempts at chemotherapy it appears.  She does have significant anemia has received [name] at in college he hematology recent hemoglobin of 12.7 on April 6 actually shows improvement.  Clinically she appears to be eating a bit better than certainly when she came here has improved although it appears she has challenges as noted above.  Regards to hypertension most recent blood pressure 138/70 she is on Toprol XL 25 mg a day.  She does have a history of a right upper arm DVT is on Eliquis for anticoagulation appears to be tolerating this well.  I did discuss the importance of taking Lasix and she did express understanding-she did agree to take it if I reduce the dose and we will try this will start at 40 mg twice a day again it appears she has not really gotten 80 mg twice a day for some time now and hopefully this will have less side effects causing less urination again we will have to monitor this. She does not complain of any increased shortness of breath or chest pain.  Again she has  been seen by palliative care again with what appears to be a fairly poor prognosis appear she has accepted the fact that she  may not be able to go home-palliative  care has been helpful when discussing this with her she appears to be at this point She is also followed by psychiatric services          Past Medical History  Diagnosis Date  . Type 2 diabetes mellitus ([place])   . Hypertension   . Pancreatic adenocarcinoma ([place])   . Essential hypertension   . Hyperlipidemia   . Asthmatic bronchitis   . Chronic pancreatitis ([place])   . Osteoarthritis of knee    . Atypical ductal hyperplasia of right breast   . Vitamin D deficiency   . GERD (gastroesophageal reflux disease)   . Hyperparathyroidism ([place])   . Zollinger-Ellison syndrome   . Carotid artery disease ([place])   . Spinal stenosis of lumbar region 09/20/2015  . Anemia of chronic renal failure, stage 3 (moderate) 09/19/2015  . Pancreatic cancer [facility])    Past Surgical History  Procedure Laterality Date  . Colonoscopy  2009    [place]  . Tonsillectomy    . Cataract extraction, bilateral Bilateral   . Parathyroidectomy    . Ovarian cyst removal    . Eus N/A 04/17/2015    Jacobs-1.7 cm x 2.5 cm mass in head of pancreas causing dilation of pancreatic duct and common bile duct.  . Ercp N/A 04/23/2015    [id number], heterogeneous,indistinctly bordered mass in head of pancreas  . Sphincterotomy N/A 04/23/2015    Procedure: SPHINCTEROTOMY;  Surgeon: Veronica Dolin, MD;  Location: [facility];  Service: Endoscopy;  Laterality: N/A;  . Biliary stent placement N/A 04/23/2015    Procedure: BILIARY STENT PLACEMENT;  Surgeon: Veronica Dolin, MD;  Location: [facility];  Service: Endoscopy;  Laterality: N/A;  . Whipple procedure  August 15th 2016    Allergies  Allergen Reactions  . Codeine Itching  . Omeprazole Nausea And Vomiting  . Penicillins Other (See Comments)    "blacked out"    Current Facility-Administered Medications on File Prior to Visit  Medication Dose Route Frequency Provider Last Rate Last Dose  . sodium chloride 0.9 % injection 10 mL  10 mL Intravenous PRN Patrici Ranks, MD   10 mL at 08/29/15 0940   Current Outpatient Prescriptions on File Prior to Visit  Medication Sig Dispense Refill  . acetaminophen (TYLENOL) 500 MG tablet Take 500 mg by mouth every 6 (six) hours as needed for mild pain or moderate pain.    Marland Kitchen atorvastatin (LIPITOR) 10 MG tablet Take 10 mg by mouth daily after lunch.     . Cholecalciferol (VITAMIN D) 2000 UNITS CAPS Take 1 capsule by mouth every morning.      Marland Kitchen ELIQUIS 5 MG TABS tablet Take 5 mg by mouth 2 (two) times daily.    . furosemide (LASIX) 80 MG tablet Take 80 mg by mouth 2 (two) times daily.    . lipase/protease/amylase (CREON) 12000 units CPEP capsule Take 2 capsules (24,000 Units total) by mouth 3 (three) times daily with meals. 270 capsule 0  . loperamide (IMODIUM A-D) 2 MG tablet Take 2 mg by mouth as needed for diarrhea or loose stools.    . metoCLOPramide (REGLAN) 10 MG tablet Take 1 tablet (10 mg total) by mouth 2 (two) times daily as needed for nausea. And at night 30 tablet 0  . metoprolol succinate (TOPROL-XL) 25 MG 24 hr tablet Take 25 mg by mouth every evening. Hold for systolic BP less than [id number]    . oxyCODONE (OXY IR/ROXICODONE) 5  MG immediate release tablet Take one tablet by mouth every 6 hours as needed for pain 120 tablet 0     Review of Systems   Does not complaining any fever chills again she appears to be slowly gaining weight.  Skin does not complain of rashes or itching.  Eyes does not complain of any visual changes or pain.  Oropharynx is not complaining of any sore throat.  Respiratory no complaints of shortness of breath or cough.  Cardiac does not complain of any chest pain again she has gained some weight tear proximally 10 pounds in the past month-some possibly slightly increased edema although not drastically so.  GU does not complain of dysuria but says she urinates often when she takes the Lasix and this makes her uncomfortable.  Muscle skeletal is not complaining of joint pain does have significant weakness generalized especially lower extremities.  Neurologic is not complaining of dizziness headache or syncopal-type feelings him.  Psych does have somewhat of a flat affect today but does not appear overtly depressed she is pleasant and appropriate-she is followed by psychiatric services.      Immunization History  Administered Date(s) Administered  . Influenza,inj,Quad PF,36+ Mos 07/18/2015    Pertinent  Health Maintenance Due  Topic Date Due  . FOOT EXAM  06/21/1945  . OPHTHALMOLOGY EXAM  06/21/1945  . URINE MICROALBUMIN  06/21/1945  . DEXA SCAN  06/21/2000  . PNA vac Low Risk Adult (1 of 2 - PCV13) 06/21/2000  . HEMOGLOBIN A1C  03/21/2016  . INFLUENZA VACCINE  05/11/2016   No flowsheet data found. Functional Status Survey:    Filed Vitals:   02/03/16 1557  BP: 138/70  Pulse: 86  Temp: 98.4 F (36.9 C)  TempSrc: Oral  Resp: 20  Height: 5\' 4"  (1.626 m)  Weight: 116 lb 12.8 oz (52.98 kg)   Body mass index is 20.04 kg/(m^2). Physical Exam   In general this is a pleasant frail elderly female in no distress sitting comfortably in her wheelchair.  Her skin is warm and dry.  Chest is clear to auscultation there is no labored breathing although somewhat shallow air entry at the bases which is not new.  Heart is regular rate and rhythm with out murmur gallop or rub she continues I would say with 2+ lower extremity bilaterally this does not appear to be significantly changed from previous exam although nursing staff feels that possibly has slightly increased over the last week or 2.  Her abdomen is soft nontender with positive bowel sounds it is protuberant with a well-healed surgical scar.  Muscle skeletal appears at baseline i most significant for lower extremity weakness which is not new she is able to move her upper extremities with baseline strength.  Neurologic is grossly intact her speech is clear and there are no lateralizing findings.  Psych she is grossly alert and oriented pleasant and appropriate.    Labs reviewed:  Recent Labs  09/19/15 0939  09/21/15 0619  09/25/15 1800  11/27/15 1905 12/18/15 1134 01/02/16 0800  NA  --   < > 139  < > 136  < > 139 132* 137  K  --   < > 3.8  < > 3.9  < > 3.9 4.2 4.2  CL  --   < > 115*  < > 108  < > 108 102 105  CO2  --   < > 21*  < > 22  < > 26 26 24   GLUCOSE  --   < >  105*  < > 134*  < > 152* 97 142*    BUN  --   < > 27*  < > 20  < > 25* 22* 28*  CREATININE  --   < > 1.66*  < > 1.41*  < > 1.62* 1.32* 1.38*  CALCIUM  --   < > 7.4*  < > 7.6*  < > 7.5* 7.4* 7.5*  MG 1.6*  --  1.6*  --  1.6*  --   --   --   --   < > = values in this interval not displayed.  Recent Labs  09/18/15 1555 10/01/15 0950 10/22/15 0946 11/27/15 1905  AST 55* 45* 31  --   ALT 34 41 28  --   ALKPHOS 91 90 80  --   BILITOT 1.0 0.4 0.6  --   PROT 4.9* 5.0* 5.1*  --   ALBUMIN 1.8* 1.6* 1.6* 1.3*    Recent Labs  09/18/15 1555  09/29/15 1100  11/11/15 0900  12/18/15 1134 01/02/16 1435 01/15/16 1028  WBC 18.8*  < > 9.0  < > 4.9  < > 5.2 6.5 4.6  NEUTROABS 15.9*  --  6.4  --  3.0  --   --   --   --   HGB 9.2*  < > 8.1*  < > 10.6*  < > 10.4* 10.6* 12.7  HCT 25.6*  < > 22.8*  < > 31.1*  < > 30.6* 31.2* 38.0  MCV 80.3  < > 84.8  < > 91.7  < > 93.0 94.5 94.5  PLT 410*  < > 289  < > 240  < > 330 294 290  < > = values in this interval not displayed. Lab Results  Component Value Date   TSH 1.110 09/18/2015   Lab Results  Component Value Date   HGBA1C 6.4* 09/21/2015   Lab Results  Component Value Date   CHOL 110 01/09/2015   HDL 50 01/09/2015   LDLCALC 49 01/09/2015   TRIG 57 01/09/2015   CHOLHDL 2.2 01/09/2015       Assessment/Plan  #1-history of lower extremity edema with hypoalbuminemia-she is on supplements again this is challenging with patient's prognosis-clinically however edema is significantly better than when she first arrived-nonetheless this may be slowly increasing especially light that she is refusing Lasix it appears the great majority of the time-this was discussed with her and she has agreed to take Lasix at a lower dose will start 40 mg twice a day and monitor closely.She also continues on potassium will reduce this to 20 day will need again to get an updated metabolic panel to make sure electrolytes are in balance  Also would like to update a metabolic panel to monitor her renal  function and electrolytes.  She does have a history of renal insufficiency most recent creatinine on March 24 was 1.38 which appears to be at the lower end of her baseline.  #2 history of pancreatic cancer-again CT scan was of limited value since it was without contrast this has been reviewed by oncology there are concerns with an elevated CA level that the cancer may be progressing and patient is aware of this and per review oncology note this has been discussed with her-she has been seen by palliative care-patient appears to understand her diagnosis in that the it would be very challenging for her to go home since apparently there is not really family to support her at this time. At  this time. Monitor for any signs of discomfort she is receiving OxyIR 6 hours as needed she appears comfortable today-.  #3-history of right upper extremity DVT she is on Eliquis for anticoagulation appears to be tolerating this well apparently there were issues with Xarelto previously. Recommendation for anticoagulation 3-6 months-she will near the six-month mark at the end of May she appears to be tolerating the Eliquis well hemoglobin is been stable again she is receiving [name].  #4-history of anemia with history of pancreatic cancer-suspect an element of chronic disease in the setting of malignancy- again she does receive [name] per hematology oncology hemoglobin has risen they follow her quite closely will update a CBC.  #5 history of diabetes 2---apparently she came into the facility on insulin however she had a significant hypoglycemic episode that responded to dextrose-we have and monitor CBGs they run mainly in the 80s to low 100s I see occasional 1 lower than 80s the lowest one that I see is 56 apparently she was asymptomatic and this is quite rare-her at bedtime snacks will have to be encouraged very strongly.  #6-hypertension she is on Toprol this appears stable most recent blood pressure 138/70 at this  point continue to monitor.  #7-history renal insufficiency as noted above creatinine 1.38 appears to be at the lower end of her baseline we will again update this.  [id number] note greater than 45 minutes spent assessing patient-discussing her status extensively with nursing staff-reviewing her chart-reviewing her labs-and coordinating formulating a plan of care for numerous diagnoses-of note greater than 50% of time spent coordinating plan of care

## 2016-02-03 NOTE — Progress Notes (Signed)
Patient ID: Veronica Stevenson, female   DOB: 14-Apr-1933, 82 y.o.   MRN: [id number]

## 2016-02-04 ENCOUNTER — Encounter (HOSPITAL_COMMUNITY)
Admission: RE | Admit: 2016-02-04 | Discharge: 2016-02-04 | Disposition: A | Discharge status: Home / Self Care | Payer: Medicare Other | Source: Skilled Nursing Facility | Attending: Internal Medicine | Admitting: Internal Medicine

## 2016-02-04 DIAGNOSIS — E1165 Type 2 diabetes mellitus with hyperglycemia: Secondary | ICD-10-CM | POA: Diagnosis not present

## 2016-02-04 DIAGNOSIS — N183 Chronic kidney disease, stage 3 (moderate): Secondary | ICD-10-CM | POA: Insufficient documentation

## 2016-02-04 DIAGNOSIS — R600 Localized edema: Secondary | ICD-10-CM | POA: Insufficient documentation

## 2016-02-04 DIAGNOSIS — N182 Chronic kidney disease, stage 2 (mild): Secondary | ICD-10-CM | POA: Diagnosis present

## 2016-02-04 DIAGNOSIS — Z9181 History of falling: Secondary | ICD-10-CM | POA: Insufficient documentation

## 2016-02-04 LAB — COMPREHENSIVE METABOLIC PANEL
ALBUMIN: 1.4 g/dL — AB (ref 3.5–5.0)
ALK PHOS: 158 U/L — AB (ref 38–126)
ALT: 26 U/L (ref 14–54)
ANION GAP: 7 (ref 5–15)
AST: 26 U/L (ref 15–41)
BILIRUBIN TOTAL: 0.8 mg/dL (ref 0.3–1.2)
BUN: 31 mg/dL — AB (ref 6–20)
CALCIUM: 7.9 mg/dL — AB (ref 8.9–10.3)
CO2: 19 mmol/L — ABNORMAL LOW (ref 22–32)
Chloride: 111 mmol/L (ref 101–111)
Creatinine, Ser: 1.33 mg/dL — ABNORMAL HIGH (ref 0.44–1.00)
GFR calc Af Amer: 43 mL/min — ABNORMAL LOW (ref >60)
GFR, EST NON AFRICAN AMERICAN: 37 mL/min — AB (ref >60)
GLUCOSE: 61 mg/dL — AB (ref 65–99)
Potassium: 4.6 mmol/L (ref 3.5–5.1)
Sodium: 137 mmol/L (ref 135–145)
TOTAL PROTEIN: 6.9 g/dL (ref 6.5–8.1)

## 2016-02-04 LAB — CBC WITH DIFFERENTIAL/PLATELET
BASOS PCT: 0 %
Basophils Absolute: 0 10*3/uL (ref 0.0–0.1)
Eosinophils Absolute: 0 10*3/uL (ref 0.0–0.7)
Eosinophils Relative: 0 %
HEMATOCRIT: 32.9 % — AB (ref 36.0–46.0)
HEMOGLOBIN: 11.2 g/dL — AB (ref 12.0–15.0)
LYMPHS ABS: 2.7 10*3/uL (ref 0.7–4.0)
LYMPHS PCT: 51 %
MCH: 31.4 pg (ref 26.0–34.0)
MCHC: 34 g/dL (ref 30.0–36.0)
MCV: 92.2 fL (ref 78.0–100.0)
MONO ABS: 0.3 10*3/uL (ref 0.1–1.0)
MONOS PCT: 6 %
NEUTROS ABS: 2.2 10*3/uL (ref 1.7–7.7)
NEUTROS PCT: 42 %
Platelets: 247 10*3/uL (ref 150–400)
RBC: 3.57 MIL/uL — ABNORMAL LOW (ref 3.87–5.11)
RDW: 15.8 % — AB (ref 11.5–15.5)
WBC: 5.3 10*3/uL (ref 4.0–10.5)

## 2016-02-04 LAB — BASIC METABOLIC PANEL
BUN: 31 mg/dL — AB (ref 4–21)
Creatinine: 1.3 mg/dL — AB (ref <=1.1)
Glucose: 61 mg/dL
SODIUM: 137 mmol/L (ref 137–147)

## 2016-02-04 LAB — CBC AND DIFFERENTIAL: WBC: 5.3 10^3/mL

## 2016-02-05 DIAGNOSIS — E785 Hyperlipidemia, unspecified: Secondary | ICD-10-CM

## 2016-02-05 DIAGNOSIS — E213 Hyperparathyroidism, unspecified: Secondary | ICD-10-CM

## 2016-02-05 DIAGNOSIS — I10 Essential (primary) hypertension: Secondary | ICD-10-CM

## 2016-02-05 DIAGNOSIS — I519 Heart disease, unspecified: Secondary | ICD-10-CM

## 2016-02-05 DIAGNOSIS — C259 Malignant neoplasm of pancreas, unspecified: Secondary | ICD-10-CM

## 2016-02-05 DIAGNOSIS — R609 Edema, unspecified: Secondary | ICD-10-CM

## 2016-02-05 DIAGNOSIS — K219 Gastro-esophageal reflux disease without esophagitis: Secondary | ICD-10-CM

## 2016-02-05 DIAGNOSIS — R630 Anorexia: Secondary | ICD-10-CM

## 2016-02-05 DIAGNOSIS — R531 Weakness: Secondary | ICD-10-CM

## 2016-02-05 DIAGNOSIS — R5383 Other fatigue: Secondary | ICD-10-CM

## 2016-02-06 ENCOUNTER — Encounter (HOSPITAL_COMMUNITY): Payer: Medicare Other

## 2016-02-10 ENCOUNTER — Encounter: Payer: Self-pay | Admitting: Internal Medicine

## 2016-02-10 ENCOUNTER — Non-Acute Institutional Stay (SKILLED_NURSING_FACILITY): Payer: Medicare Other | Admitting: Internal Medicine

## 2016-02-10 DIAGNOSIS — N183 Chronic kidney disease, stage 3 unspecified: Secondary | ICD-10-CM

## 2016-02-10 DIAGNOSIS — C259 Malignant neoplasm of pancreas, unspecified: Secondary | ICD-10-CM | POA: Diagnosis not present

## 2016-02-10 DIAGNOSIS — R52 Pain, unspecified: Secondary | ICD-10-CM

## 2016-02-10 NOTE — Progress Notes (Deleted)
Location:  [facility] of Service:  SNF [phone]) Provider:  [name], NP  Patient Care Team: [name], NP as PCP - General (Nurse Practitioner) Daneil Dolin, MD as Consulting Physician (Gastroenterology)  Extended Emergency Contact Information Primary Emergency Contact: Ervin,Kaye Address: [address]          [place], [place] [address] Johnnette Litter of [place] Phone: [phone] Relation: Sister Secondary Emergency Contact: Long,Phyllis Address: [address] [place] [address] Johnnette Litter of [place] Phone: [phone] Relation: Other  Code Status:  *** Goals of care: Advanced Directive information Advanced Directives 02/10/2016  Does patient have an advance directive? Yes  Type of Paramedic of [place];Out of facility DNR (pink MOST or yellow form)  Does patient want to make changes to advanced directive? -  Copy of advanced directive(s) in chart? Yes     Chief Complaint  Patient presents with  . Acute Visit    HPI:  Pt is a 82 y.o. female seen today for an acute visit for    Past Medical History  Diagnosis Date  . Type 2 diabetes mellitus ([place])   . Hypertension   . Pancreatic adenocarcinoma ([place])   . Essential hypertension   . Hyperlipidemia   . Asthmatic bronchitis   . Chronic pancreatitis ([place])   . Osteoarthritis of knee   . Atypical ductal hyperplasia of right breast   . Vitamin D deficiency   . GERD (gastroesophageal reflux disease)   . Hyperparathyroidism ([place])   . Zollinger-Ellison syndrome   . Carotid artery disease ([place])   . Spinal stenosis of lumbar region 09/20/2015  . Anemia of chronic renal failure, stage 3 (moderate) 09/19/2015  . Pancreatic cancer [facility])    Past Surgical History  Procedure Laterality Date  . Colonoscopy  2009    [place]  . Tonsillectomy    . Cataract extraction, bilateral Bilateral   . Parathyroidectomy    . Ovarian cyst removal      . Eus N/A 04/17/2015    Jacobs-1.7 cm x 2.5 cm mass in head of pancreas causing dilation of pancreatic duct and common bile duct.  . Ercp N/A 04/23/2015    [id number], heterogeneous,indistinctly bordered mass in head of pancreas  . Sphincterotomy N/A 04/23/2015    Procedure: SPHINCTEROTOMY;  Surgeon: Daneil Dolin, MD;  Location: [facility];  Service: Endoscopy;  Laterality: N/A;  . Biliary stent placement N/A 04/23/2015    Procedure: BILIARY STENT PLACEMENT;  Surgeon: Daneil Dolin, MD;  Location: [facility];  Service: Endoscopy;  Laterality: N/A;  . Whipple procedure  August 15th 2016    Allergies  Allergen Reactions  . Codeine Itching  . Morphine Sulfate   . Omeprazole Nausea And Vomiting  . Penicillins Other (See Comments)    "blacked out"    Current Facility-Administered Medications on File Prior to Visit  Medication Dose Route Frequency Provider Last Rate Last Dose  . sodium chloride 0.9 % injection 10 mL  10 mL Intravenous PRN Patrici Ranks, MD   10 mL at 08/29/15 0940   Current Outpatient Prescriptions on File Prior to Visit  Medication Sig Dispense Refill  . acetaminophen (TYLENOL) 500 MG tablet Take 500 mg by mouth every 6 (six) hours as needed for mild pain or moderate pain.    . Amino Acids-Protein Hydrolys (FEEDING SUPPLEMENT, PRO-STAT SUGAR FREE 64,) LIQD Give 20 ml by  mouth three times a day    . atorvastatin (LIPITOR) 10 MG tablet Take 10 mg by mouth daily after lunch.     . Cholecalciferol (VITAMIN D) 2000 UNITS CAPS Take 1 capsule by mouth every morning.     Marland Kitchen ELIQUIS 5 MG TABS tablet Take 5 mg by mouth 2 (two) times daily.    . lipase/protease/amylase (CREON) 12000 units CPEP capsule Take 2 capsules (24,000 Units total) by mouth 3 (three) times daily with meals. 270 capsule 0  . metoCLOPramide (REGLAN) 10 MG tablet Take 1 tablet (10 mg total) by mouth 2 (two) times daily as needed for nausea. And at night 30 tablet 0  . metoprolol succinate (TOPROL-XL) 25 MG 24 hr  tablet Take 25 mg by mouth every evening. Hold for systolic BP less than 123XX123    . NON FORMULARY Magic cup should come on meal tray with meals    . oxyCODONE (OXY IR/ROXICODONE) 5 MG immediate release tablet Take one tablet by mouth every 6 hours as needed for pain 120 tablet 0  . potassium chloride (K-DUR) 10 MEQ tablet Take 20 mEq by mouth 2 (two) times daily.    Marland Kitchen trypsin-balsam-castor oil (XENADERM) ointment Apply to sacrum and bilateral buttocks for prevention  Every shift           Review of Systems  Immunization History  Administered Date(s) Administered  . Influenza,inj,Quad PF,36+ Mos 07/18/2015   Pertinent  Health Maintenance Due  Topic Date Due  . FOOT EXAM  06/21/1945  . OPHTHALMOLOGY EXAM  06/21/1945  . URINE MICROALBUMIN  06/21/1945  . DEXA SCAN  06/21/2000  . PNA vac Low Risk Adult (1 of 2 - PCV13) 06/21/2000  . HEMOGLOBIN A1C  03/21/2016  . INFLUENZA VACCINE  05/11/2016   No flowsheet data found. Functional Status Survey:    Filed Vitals:   02/10/16 0932  BP: 115/68  Pulse: 91  Temp: 97.8 F (36.6 C)  TempSrc: Oral  Resp: 18  Height: 5\' 4"  (1.626 m)  Weight: 111 lb 9.6 oz (50.621 kg)   Body mass index is 19.15 kg/(m^2). Physical Exam  Labs reviewed:  Recent Labs  09/19/15 0939  09/21/15 0619  09/25/15 1800  12/18/15 1134 01/02/16 0800 02/04/16 0745  NA  --   < > 139  < > 136  < > 132* 137 137  K  --   < > 3.8  < > 3.9  < > 4.2 4.2 4.6  CL  --   < > 115*  < > 108  < > 102 105 111  CO2  --   < > 21*  < > 22  < > 26 24 19*  GLUCOSE  --   < > 105*  < > 134*  < > 97 142* 61*  BUN  --   < > 27*  < > 20  < > 22* 28* 31*  CREATININE  --   < > 1.66*  < > 1.41*  < > 1.32* 1.38* 1.33*  CALCIUM  --   < > 7.4*  < > 7.6*  < > 7.4* 7.5* 7.9*  MG 1.6*  --  1.6*  --  1.6*  --   --   --   --   < > = values in this interval not displayed.  Recent Labs  10/01/15 0950 10/22/15 0946 11/27/15 1905 02/04/16 0745  AST 45* 31  --  26  ALT 41 28  --  26  ALKPHOS 90 80  --  158*  BILITOT 0.4 0.6  --  0.8  PROT 5.0* 5.1*  --  6.9  ALBUMIN 1.6* 1.6* 1.3* 1.4*    Recent Labs  09/29/15 1100  11/11/15 0900  01/02/16 1435 01/15/16 1028 02/04/16 0745  WBC 9.0  < > 4.9  < > 6.5 4.6 5.3  NEUTROABS 6.4  --  3.0  --   --   --  2.2  HGB 8.1*  < > 10.6*  < > 10.6* 12.7 11.2*  HCT 22.8*  < > 31.1*  < > 31.2* 38.0 32.9*  MCV 84.8  < > 91.7  < > 94.5 94.5 92.2  PLT 289  < > 240  < > 294 290 247  < > = values in this interval not displayed. Lab Results  Component Value Date   TSH 1.110 09/18/2015   Lab Results  Component Value Date   HGBA1C 6.4* 09/21/2015   Lab Results  Component Value Date   CHOL 110 01/09/2015   HDL 50 01/09/2015   LDLCALC 49 01/09/2015   TRIG 57 01/09/2015   CHOLHDL 2.2 01/09/2015    Significant Diagnostic Results in last 30 days:  No results found.  Assessment/Plan There are no diagnoses linked to this encounter.   Family/ staff Communication: ***  Labs/tests ordered:  ***

## 2016-02-12 ENCOUNTER — Other Ambulatory Visit (HOSPITAL_COMMUNITY): Payer: Medicare Other

## 2016-02-12 ENCOUNTER — Ambulatory Visit (HOSPITAL_COMMUNITY): Payer: Medicare Other

## 2016-02-12 ENCOUNTER — Ambulatory Visit (HOSPITAL_COMMUNITY): Payer: Medicare Other | Admitting: Hematology & Oncology

## 2016-02-15 NOTE — Progress Notes (Signed)
This encounter was created in error - please disregard.

## 2016-02-15 NOTE — Progress Notes (Signed)
Patient ID: Veronica Stevenson, female   DOB: 08-Jun-1933, 82 y.o.   MRN: [id number]       Location:  [facility] of Service:  SNF (31)  [name], NP  Patient Care Team: [name], NP as PCP - General (Nurse Practitioner) Daneil Dolin, MD as Consulting Physician (Gastroenterology)  Extended Emergency Contact Information Primary Emergency Contact: Ervin,Kaye Address: [address]          [place], [place] [address] Johnnette Litter of [place] Phone: [phone] Relation: Sister Secondary Emergency Contact: Long,Phyllis Address: [address] [place] [address] Johnnette Litter of [place] Phone: [phone] Relation: Other  Goals of care: Advanced Directive information Advanced Directives 02/10/2016  Does patient have an advance directive? Yes  Type of Paramedic of [place];Out of facility DNR (pink MOST or yellow form)  Does patient want to make changes to advanced directive? -  Copy of advanced directive(s) in chart? Yes     Chief Complaint  Patient presents with  . Acute Visit  Secondary to pain management-weight gain?  HPI:  Pt is a 82 y.o. female     She does have a history of pancreatic cancer is followed closely by oncology-recent CT scan could not be done with contrast so it was a limited study but did not show progression of the disease however CA 19-9 was elevated which gave suspicions that it may be progressing.  Patient has been seen by palliative care-she has not done well with attempts at chemotherapy it appears.--She is now under hospice services.  Nursing has left a note about possibly increasing her pain medication secondary pain complaints-I did speak with her about this today she states at this point the current pain medicine which is I see IR 5 mg every 6 hours as needed appears to be effective-apparently nursing at times gets a different report-but she tells me that the pain control  is adequate this point-again will have to monitor this.  She also in the past has refused her Lasix saying it makes her urinate too much Lasix dose was decreased from 80-40 mg twice a day withhold she would take it and apparently this has led to more compliance.  It appears at weight on April 26 was 114.2 which appeared to be a gradual weight gain of about 4 pounds over 2 weeks however most recent weight is 111.6-her edema looks relatively stable.  Currently she has no acute complaints she appears to be somewhat increasingly weak but she does not appear to be uncomfortable   .            Past Medical History  Diagnosis Date  . Type 2 diabetes mellitus ([place])   . Hypertension   . Pancreatic adenocarcinoma ([place])   . Essential hypertension   . Hyperlipidemia   . Asthmatic bronchitis   . Chronic pancreatitis ([place])   . Osteoarthritis of knee   . Atypical ductal hyperplasia of right breast   . Vitamin D deficiency   . GERD (gastroesophageal reflux disease)   . Hyperparathyroidism ([place])   . Zollinger-Ellison syndrome   . Carotid artery disease ([place])   . Spinal stenosis of lumbar region 09/20/2015  . Anemia of chronic renal failure, stage 3 (moderate) 09/19/2015  . Pancreatic cancer [facility])    Past Surgical History  Procedure Laterality Date  . Colonoscopy  2009    [place]  . Tonsillectomy    .  Cataract extraction, bilateral Bilateral   . Parathyroidectomy    . Ovarian cyst removal    . Eus N/A 04/17/2015    Jacobs-1.7 cm x 2.5 cm mass in head of pancreas causing dilation of pancreatic duct and common bile duct.  . Ercp N/A 04/23/2015    [id number], heterogeneous,indistinctly bordered mass in head of pancreas  . Sphincterotomy N/A 04/23/2015    Procedure: SPHINCTEROTOMY;  Surgeon: Daneil Dolin, MD;  Location: [facility];  Service: Endoscopy;  Laterality: N/A;  . Biliary stent placement N/A 04/23/2015    Procedure: BILIARY STENT PLACEMENT;  Surgeon: Daneil Dolin, MD;   Location: [facility];  Service: Endoscopy;  Laterality: N/A;  . Whipple procedure  August 15th 2016    Allergies  Allergen Reactions  . Codeine Itching  . Morphine Sulfate   . Omeprazole Nausea And Vomiting  . Penicillins Other (See Comments)    "blacked out"    Current Facility-Administered Medications on File Prior to Visit  Medication Dose Route Frequency Provider Last Rate Last Dose  . sodium chloride 0.9 % injection 10 mL  10 mL Intravenous PRN Patrici Ranks, MD   10 mL at 08/29/15 0940   Current Outpatient Prescriptions on File Prior to Visit  Medication Sig Dispense Refill  . acetaminophen (TYLENOL) 500 MG tablet Take 500 mg by mouth every 6 (six) hours as needed for mild pain or moderate pain.    . Amino Acids-Protein Hydrolys (FEEDING SUPPLEMENT, PRO-STAT SUGAR FREE 64,) LIQD Give 20 ml by mouth three times a day    . atorvastatin (LIPITOR) 10 MG tablet Take 10 mg by mouth daily after lunch.     . Cholecalciferol (VITAMIN D) 2000 UNITS CAPS Take 1 capsule by mouth every morning.     Marland Kitchen ELIQUIS 5 MG TABS tablet Take 5 mg by mouth 2 (two) times daily.    . lipase/protease/amylase (CREON) 12000 units CPEP capsule Take 2 capsules (24,000 Units total) by mouth 3 (three) times daily with meals. 270 capsule 0  . metoCLOPramide (REGLAN) 10 MG tablet Take 1 tablet (10 mg total) by mouth 2 (two) times daily as needed for nausea. And at night 30 tablet 0  . metoprolol succinate (TOPROL-XL) 25 MG 24 hr tablet Take 25 mg by mouth every evening. Hold for systolic BP less than 123XX123    . NON FORMULARY Magic cup should come on meal tray with meals    . oxyCODONE (OXY IR/ROXICODONE) 5 MG immediate release tablet Take one tablet by mouth every 6 hours as needed for pain 120 tablet 0  . potassium chloride (K-DUR) 10 MEQ tablet Take 20 mEq by mouth 2 (two) times daily.    Marland Kitchen trypsin-balsam-castor oil (XENADERM) ointment Apply to sacrum and bilateral buttocks for prevention  Every shift     Of note  she is on Lasix 40 mg twice a day she is now on potassium 20 mEq once a day  Review of Systems   Does not complaining any fever chills   Skin does not complain of rashes or itching.  Eyes does not complain of any visual changes or pain.  Oropharynx is not complaining of any sore throat.  Respiratory no complaints of shortness of breath or cough.  Cardiac does not complain of any chest pain   GU does not complain of dysuria but says she urinates often when she takes the Lasix and this makes her uncomfortable.  Muscle skeletal is not complaining of joint pain does have significant  weakness generalized especially lower extremities.  Neurologic is not complaining of dizziness headache or syncopal-type feelings .  Psych appears bright and alert today pleasant      Immunization History  Administered Date(s) Administered  . Influenza,inj,Quad PF,36+ Mos 07/18/2015   Pertinent  Health Maintenance Due  Topic Date Due  . FOOT EXAM  06/21/1945  . OPHTHALMOLOGY EXAM  06/21/1945  . URINE MICROALBUMIN  06/21/1945  . DEXA SCAN  06/21/2000  . PNA vac Low Risk Adult (1 of 2 - PCV13) 06/21/2000  . HEMOGLOBIN A1C  03/21/2016  . INFLUENZA VACCINE  05/11/2016   No flowsheet data found. Functional Status Survey:    Filed Vitals:   02/10/16 0932  BP: 115/68  Pulse: 91  Temp: 97.8 F (36.6 C)  TempSrc: Oral  Resp: 18  Height: 5\' 4"  (1.626 m)  Weight: 111 lb 9.6 oz (50.621 kg)   Body mass index is 19.15 kg/(m^2). Physical Exam   In general this is a pleasant frail elderly female in no distress sitting comfortably in her wheelchair.  Her skin is warm and dry.  Chest is clear to auscultation there is no labored breathing although somewhat shallow air entry at the bases which is not new.  Heart is regular rate and rhythm with out murmur gallop or rub she continues I would say with 2+ lower extremity bilaterally .  Her abdomen is soft nontender with positive bowel sounds it is  protuberant with a well-healed surgical scar.  Muscle skeletal appears at baseline i most significant for lower extremity weakness which is not new she is able to move her upper extremities with baseline strength.  Neurologic is grossly intact her speech is clear and there are no lateralizing findings.  Psych she is grossly alert and oriented pleasant and appropriate.    Labs reviewed:  Recent Labs  09/19/15 0939  09/21/15 0619  09/25/15 1800  12/18/15 1134 01/02/16 0800 02/04/16 0745  NA  --   < > 139  < > 136  < > 132* 137 137  K  --   < > 3.8  < > 3.9  < > 4.2 4.2 4.6  CL  --   < > 115*  < > 108  < > 102 105 111  CO2  --   < > 21*  < > 22  < > 26 24 19*  GLUCOSE  --   < > 105*  < > 134*  < > 97 142* 61*  BUN  --   < > 27*  < > 20  < > 22* 28* 31*  CREATININE  --   < > 1.66*  < > 1.41*  < > 1.32* 1.38* 1.33*  CALCIUM  --   < > 7.4*  < > 7.6*  < > 7.4* 7.5* 7.9*  MG 1.6*  --  1.6*  --  1.6*  --   --   --   --   < > = values in this interval not displayed.  Recent Labs  10/01/15 0950 10/22/15 0946 11/27/15 1905 02/04/16 0745  AST 45* 31  --  26  ALT 41 28  --  26  ALKPHOS 90 80  --  158*  BILITOT 0.4 0.6  --  0.8  PROT 5.0* 5.1*  --  6.9  ALBUMIN 1.6* 1.6* 1.3* 1.4*    Recent Labs  09/29/15 1100  11/11/15 0900  01/02/16 1435 01/15/16 1028 02/04/16 0745  WBC 9.0  < > 4.9  < >  6.5 4.6 5.3  NEUTROABS 6.4  --  3.0  --   --   --  2.2  HGB 8.1*  < > 10.6*  < > 10.6* 12.7 11.2*  HCT 22.8*  < > 31.1*  < > 31.2* 38.0 32.9*  MCV 84.8  < > 91.7  < > 94.5 94.5 92.2  PLT 289  < > 240  < > 294 290 247  < > = values in this interval not displayed. Lab Results  Component Value Date   TSH 1.110 09/18/2015   Lab Results  Component Value Date   HGBA1C 6.4* 09/21/2015   Lab Results  Component Value Date   CHOL 110 01/09/2015   HDL 50 01/09/2015   LDLCALC 49 01/09/2015   TRIG 57 01/09/2015   CHOLHDL 2.2 01/09/2015       Assessment/Plan  #1-history of lower  extremity edema with hypoalbuminemia Edema appears relatively baseline weight appears relatively stable suspect there are some scale variations at this point will monitor  Also would like to update a metabolic panel to monitor her renal function and electrolytes. If this is okay with hospice  She does have a history of renal insufficiency most recent creatinine on April 26 is 1.33  #2 history of pancreatic cancer-again CT scan was of limited value since it was without contrast this has been reviewed by oncology there are concerns with an elevated CA level that the cancer may be progressing and patient is aware of this and per review oncology note this has been discussed with her-she has been seen by palliative care-patient appears to understand her diagnosis in that the it would be very challenging for her to go home since apparently there is not really family to support her at this time.--She is now on hospice care  In regards to pain management-again I did discuss this with her and she stated the OxyIR appears to be effective this will have to be watched however apparently nursing at times will get a different opinion-she does not appear uncomfortable today but certainly with her condition this will have to be watched.  [id number] note greater than 25 minutes spent assessing patient-reviewing her chart-reviewing her labs-discussing her status with nursing staff-and coordinating plan of care-of note greater than 50% of time spent coordinating plan of care with nursing  andpatient input

## 2016-02-18 ENCOUNTER — Non-Acute Institutional Stay (SKILLED_NURSING_FACILITY): Payer: Medicare Other | Admitting: Internal Medicine

## 2016-02-18 DIAGNOSIS — R609 Edema, unspecified: Secondary | ICD-10-CM | POA: Diagnosis not present

## 2016-02-18 DIAGNOSIS — C259 Malignant neoplasm of pancreas, unspecified: Secondary | ICD-10-CM

## 2016-02-18 DIAGNOSIS — R52 Pain, unspecified: Secondary | ICD-10-CM | POA: Diagnosis not present

## 2016-02-18 NOTE — Progress Notes (Signed)
Patient ID: Veronica Stevenson, female   DOB: [birth year], 82 y.o.   MRN: [id number]    Location:  [facility] Room Number: [id number] of Service:  SNF [phone]) Provider:  Arlyss Repress, NP  Patient Care Team: [name], NP as PCP - General (Nurse Practitioner) Daneil Dolin, MD as Consulting Physician (Gastroenterology)  Extended Emergency Contact Information Primary Emergency Contact: Ervin,Kaye Address: [address]          [place], [place] [address] [place] of [place] Phone: [phone] Relation: Sister Secondary Emergency Contact: Long,Phyllis Address: [address] [place] [address] [place] Phone: [phone] Relation: Other  Code Status:  DNR Goals of care: Advanced Directive information Advanced Directives 02/18/2016  Does patient have an advance directive? Yes  Type of Advance Directive Out of facility DNR (pink MOST or yellow form)  Does patient want to make changes to advanced directive? No - Patient declined  Copy of advanced directive(s) in chart? Yes  Would patient like information on creating an advanced directive? No - patient declined information  Pre-existing out of facility DNR order (yellow form or pink MOST form) Yellow form placed in chart (order not valid for inpatient use)     Chief Complaint  Patient presents with  . Acute Visit    Complains of leg pain during the night.    HPI:  Pt is a 82 y.o. female seen today for an acute visit for Pain management-patient with a history of end-stage pancreatic cancer-she is under hospice care.  She is complaining of generalized pain today.  I did see her recently-she had that time thought OxyIR was effective.  She was getting this every 6 hours when necessary.  Per medication review today and appears her OxyIR was discontinued yesterday although it is unclear exactly why this was done.  She is complaining of generalized  pain today.  Appears previously she was taking her OxyIR from 1 up to 3 times a day.  Currently she has no other complaints although she appears to be somewhat depressed-she has apparently good days in this regards and other days where she feels somewhat more down she is followed by psychiatric services and feel reconsult would be warranted  \She does have a history of protein calorie malnutrition with low albumin she is on Lasix 40 mg a day this had been in issue earlier as 80 mg a day she was refusing this often-however apparently she is now more compliant with the reduced dose her weight appears to be stable at around 211 edema appears to be relatively baseline area  She does not complain of any shortness of breath or chest pain says she is eating fairly --in act-- was eating lunch when I entered the room today   Past Medical History  Diagnosis Date  . Type 2 diabetes mellitus ([place])   . Hypertension   . Pancreatic adenocarcinoma ([place])   . Essential hypertension   . Hyperlipidemia   . Asthmatic bronchitis   . Chronic pancreatitis ([place])   . Osteoarthritis of knee   . Atypical ductal hyperplasia of right breast   . Vitamin D deficiency   . GERD (gastroesophageal reflux disease)   . Hyperparathyroidism ([place])   . Zollinger-Ellison syndrome   . Carotid artery disease ([place])   . Spinal stenosis of lumbar region 09/20/2015  . Anemia of chronic renal failure, stage 3 (  moderate) 09/19/2015  . Pancreatic cancer [facility])    Past Surgical History  Procedure Laterality Date  . Colonoscopy  2009      . Tonsillectomy    . Cataract extraction, bilateral Bilateral   . Parathyroidectomy    . Ovarian cyst removal    . Eus N/A 04/17/2015    Jacobs-1.7 cm x 2.5 cm mass in head of pancreas causing dilation of pancreatic duct and common bile duct.  . Ercp N/A 04/23/2015    [id number], heterogeneous,indistinctly bordered mass in head of pancreas  . Sphincterotomy N/A 04/23/2015     Procedure: SPHINCTEROTOMY;  Surgeon: Daneil Dolin, MD;  Location: [facility];  Service: Endoscopy;  Laterality: N/A;  . Biliary stent placement N/A 04/23/2015    Procedure: BILIARY STENT PLACEMENT;  Surgeon: Daneil Dolin, MD;  Location: [facility];  Service: Endoscopy;  Laterality: N/A;  . Whipple procedure  August 15th 2016    Allergies  Allergen Reactions  . Codeine Itching  . Morphine Sulfate   . Omeprazole Nausea And Vomiting  . Penicillins Other (See Comments)    "blacked out"      Medication List    Notice    This visit is during an admission. Changes to the med list made in this visit will be reflected in the After Visit Summary of the admission.     Facility-Administered Encounter Medications as of 02/18/2016  Medication  . sodium chloride 0.9 % injection 10 mL   Outpatient Encounter Prescriptions as of 02/18/2016  Medication Sig  . acetaminophen (TYLENOL) 500 MG tablet Take 500 mg by mouth every 6 (six) hours as needed for mild pain or moderate pain.  Marland Kitchen AMBULATORY NON FORMULARY MEDICATION Magic Cup Sig: Give three times daily with meals  . Amino Acids-Protein Hydrolys (FEEDING SUPPLEMENT, PRO-STAT SUGAR FREE 64,) LIQD Give 20 ml by mouth three times a day  . atorvastatin (LIPITOR) 10 MG tablet Take 10 mg by mouth daily after lunch.   . Cholecalciferol (VITAMIN D) 2000 UNITS CAPS Take 1 capsule by mouth every morning.   Marland Kitchen ELIQUIS 5 MG TABS tablet Take 5 mg by mouth 2 (two) times daily.  . furosemide (LASIX) 40 MG tablet Take 40 mg by mouth 2 (two) times daily.  . lipase/protease/amylase (CREON) 12000 units CPEP capsule Take 2 capsules (24,000 Units total) by mouth 3 (three) times daily with meals.  Marland Kitchen LORazepam (ATIVAN) 2 MG/ML concentrated solution Give 2 mg/ml by mouth every 3 hours as needed for anxiety  . metoCLOPramide (REGLAN) 10 MG tablet Take 1 tablet (10 mg total) by mouth 2 (two) times daily as needed for nausea. And at night  . metoprolol succinate (TOPROL-XL) 25 MG  24 hr tablet Take 25 mg by mouth every evening. Hold for systolic BP less than [id number]  . potassium chloride SA (K-DUR,KLOR-CON) 20 MEQ tablet Take one tablet by mouth once daily  . trypsin-balsam-castor oil (XENADERM) ointment Apply to sacrum and bilateral buttocks for prevention  Every shift  . [DISCONTINUED] NON FORMULARY Magic cup should come on meal tray with meals  . [DISCONTINUED] oxyCODONE (OXY IR/ROXICODONE) 5 MG immediate release tablet Take one tablet by mouth every 6 hours as needed for pain  . [DISCONTINUED] potassium chloride (K-DUR) 10 MEQ tablet Take 20 mEq by mouth 2 (two) times daily.    Review of Systems  She does not complaining any fever or chills her weight appears to be stabilized.  Skin does not complain of rashes or itching.  Eyes  not complaining of any visual changes.  Respiratory does not complain of shortness of breath or cough.  Cardiac has baseline edema does not complaining of chest pain.  Muscle skeletal does complain of generalized pain she does not really localize this.  Neurologic is not complaining of dizziness headache or syncopal-type feelings or numbness.  Psych continues at times with a flat affect she is pleasant appropriate but does appear to be somewhat anxious and a bit down today although I would not classify this as profoundly depressed.    Immunization History  Administered Date(s) Administered  . Influenza,inj,Quad PF,36+ Mos 07/18/2015   Pertinent  Health Maintenance Due  Topic Date Due  . FOOT EXAM  06/21/1943  . OPHTHALMOLOGY EXAM  06/21/1943  . URINE MICROALBUMIN  06/21/1943  . DEXA SCAN  06/21/2000  . PNA vac Low Risk Adult (1 of 2 - PCV13) 06/21/2000  . HEMOGLOBIN A1C  03/21/2016  . INFLUENZA VACCINE  05/11/2016   No flowsheet data found. Functional Status Survey:    Filed Vitals:   02/18/16 0908  BP: 100/67  Pulse: 84  Temp: 98.3 F (36.8 C)  TempSrc: Oral  Resp: 17  Height: 5\' 4"  (1.626 m)  Weight: 111 lb 9.6 oz  (50.621 kg)   Body mass index is 19.15 kg/(m^2). Physical Exam   In general this is a pleasant elderly female in no distress sitting comfortably in her wheelchair she is eating her lunch.  Her skin is warm and dry.  Chest is clear to auscultation there is no labored breathing shallow air entry which is not new.  Her heart is regular rate and rhythm without murmur gallop or rub she has 2+ lower extremity edema this appears to extend into her abdominal area-it is cold to touch nonerythematous.  Her abdomen is soft nontender protuberant with well-healed surgical scar she does have positive bowel sounds.  Muscle skeletal does have some continued lower extremity weakness Buser upper extremities at baseline I do not see changes from previous exams.  Neurologic is grossly intact her speech is clear no lateralizing findings.  Psych she is alert and oriented pleasant and appropriate although again does have somewhat of a flat anxious affect today  Labs reviewed:  Recent Labs  09/19/15 0939  09/21/15 0619  09/25/15 1800  12/18/15 1134 01/02/16 0800 02/04/16 02/04/16 0745  NA  --   < > 139  < > 136  < > 132* 137 137 137  K  --   < > 3.8  < > 3.9  < > 4.2 4.2  --  4.6  CL  --   < > 115*  < > 108  < > 102 105  --  111  CO2  --   < > 21*  < > 22  < > 26 24  --  19*  GLUCOSE  --   < > 105*  < > 134*  < > 97 142*  --  61*  BUN  --   < > 27*  < > 20  < > 22* 28* 31* 31*  CREATININE  --   < > 1.66*  < > 1.41*  < > 1.32* 1.38* 1.3* 1.33*  CALCIUM  --   < > 7.4*  < > 7.6*  < > 7.4* 7.5*  --  7.9*  MG 1.6*  --  1.6*  --  1.6*  --   --   --   --   --   < > =  values in this interval not displayed.  Recent Labs  10/01/15 0950 10/22/15 0946 11/27/15 1905 02/04/16 0745  AST 45* 31  --  26  ALT 41 28  --  26  ALKPHOS 90 80  --  158*  BILITOT 0.4 0.6  --  0.8  PROT 5.0* 5.1*  --  6.9  ALBUMIN 1.6* 1.6* 1.3* 1.4*    Recent Labs  09/29/15 1100  11/11/15 0900  01/02/16 1435  01/15/16 1028 02/04/16 02/04/16 0745  WBC 9.0  < > 4.9  < > 6.5 4.6 5.3 5.3  NEUTROABS 6.4  --  3.0  --   --   --   --  2.2  HGB 8.1*  < > 10.6*  < > 10.6* 12.7  --  11.2*  HCT 22.8*  < > 31.1*  < > 31.2* 38.0  --  32.9*  MCV 84.8  < > 91.7  < > 94.5 94.5  --  92.2  PLT 289  < > 240  < > 294 290  --  247  < > = values in this interval not displayed. Lab Results  Component Value Date   TSH 1.110 09/18/2015   Lab Results  Component Value Date   HGBA1C 6.4* 09/21/2015   Lab Results  Component Value Date   CHOL 110 01/09/2015   HDL 50 01/09/2015   LDLCALC 49 01/09/2015   TRIG 57 01/09/2015   CHOLHDL 2.2 01/09/2015    Significant Diagnostic Results in last 30 days:  No results found.  Assessment/Plan Assessment and plan.  #1 generalized pain-not sure exactly why OxyIR was discontinued yesterday-I have spoken with nursing about this-will restart the OxyIR and will make it more frequent every 4 hours as needed for pain and monitor here-she does have lorazepam as well when necessary and this will have to be encouraged as well I suspect there is some anxiety component to this.--I CP medications are somewhat limited secondary to allergies listed including morphine and codeine-if pain is persistent suspect we may go to a longer acting oxycodone  Also will order for psych to reevaluate her secondary to concerns of anxiety and I suspect depression although apparently she has some good days interspersed with days of more flat affect  #2  Her edema appears to be relatively at baseline weights have been stable apparently she is now taking her Lasix--since she is hospice further labs have been deferred-she will need hospice follow-up.  [id number] note greater than 25 minutes spent assessing patient-discussing her status with nursing-reviewing her chart-and coordinating plan of care-of note greater than 50% of time spent cord a plan of care with nursing input

## 2016-03-11 DEATH — deceased

## 2016-03-25 ENCOUNTER — Ambulatory Visit: Payer: Medicare Other | Admitting: Cardiology

## 2016-03-30 ENCOUNTER — Ambulatory Visit: Payer: Medicare Other | Admitting: Cardiology

## 2016-07-15 IMAGING — MG MM DIGITAL DIAGNOSTIC UNILAT*R*
2 series · 2 of 2 positions shown · non-contrast
Comparison: Previous exam(s).

CLINICAL DATA: Post stereotactic right breast biopsy

EXAM:
DIAGNOSTIC RIGHT MAMMOGRAM POST STEREOTACTIC/3D BIOPSY

[R CC]
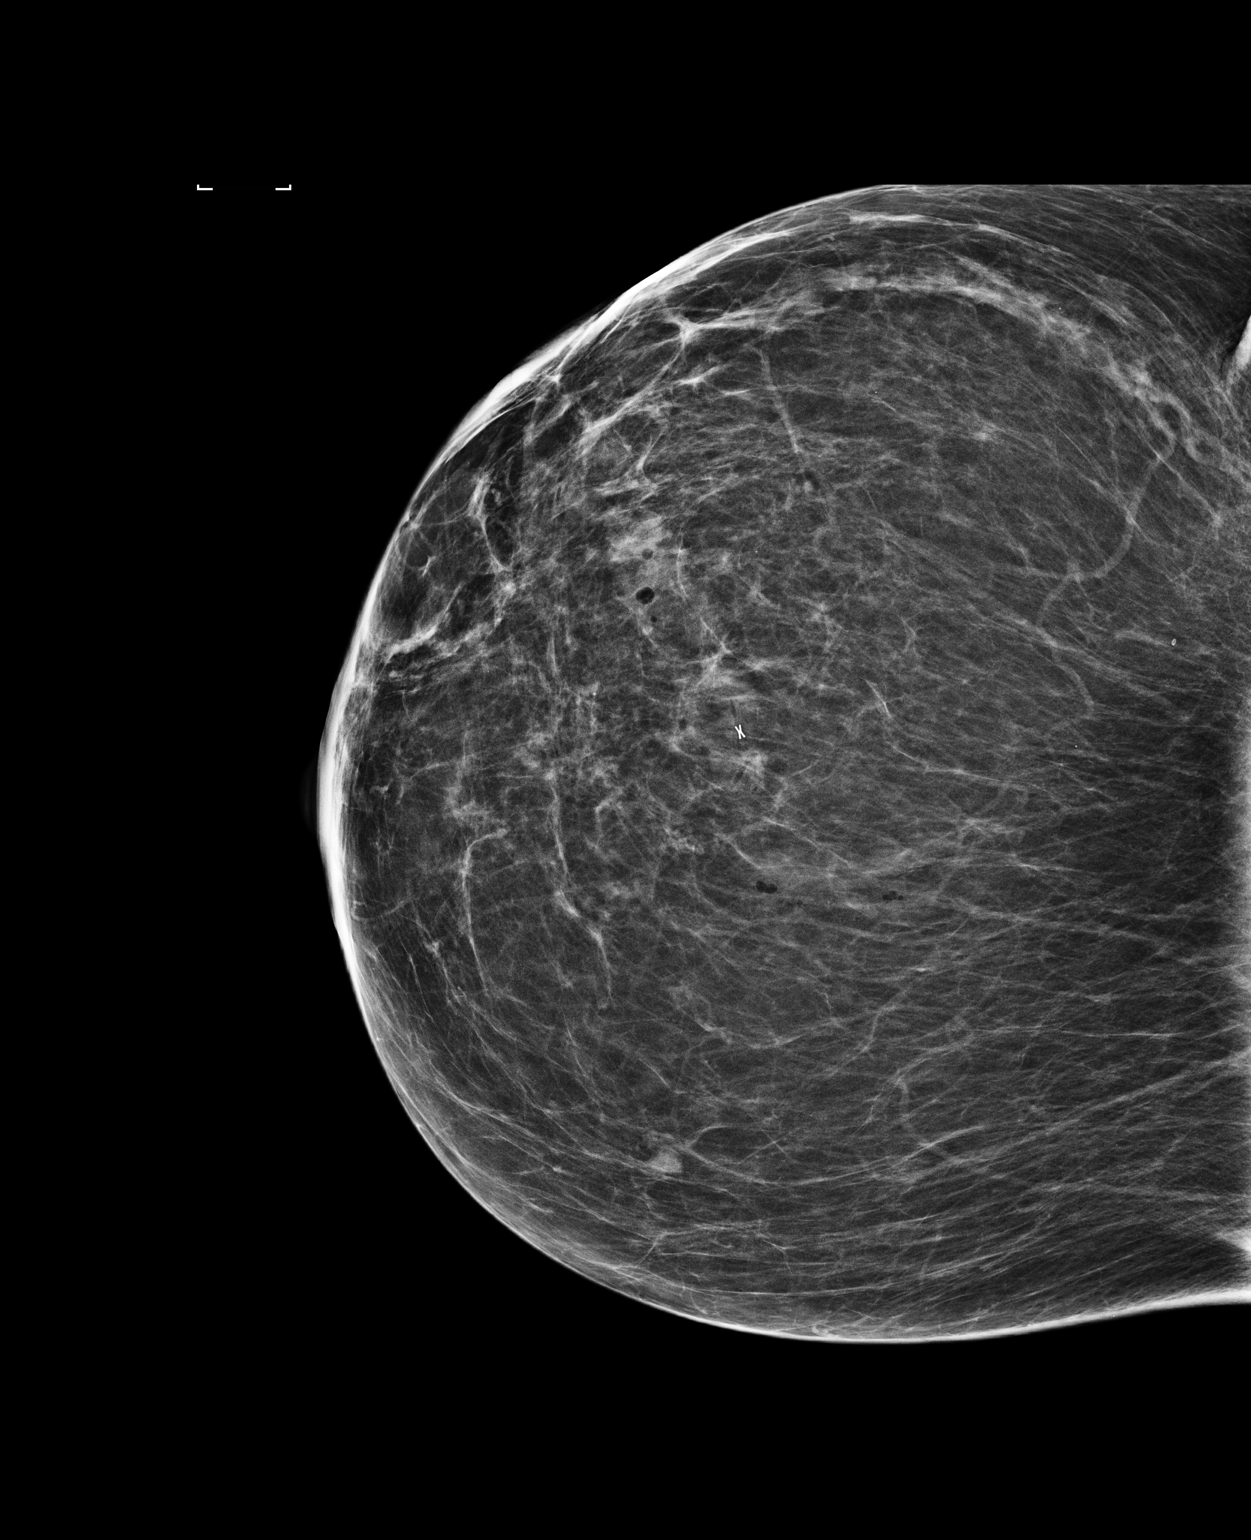

[R LM]
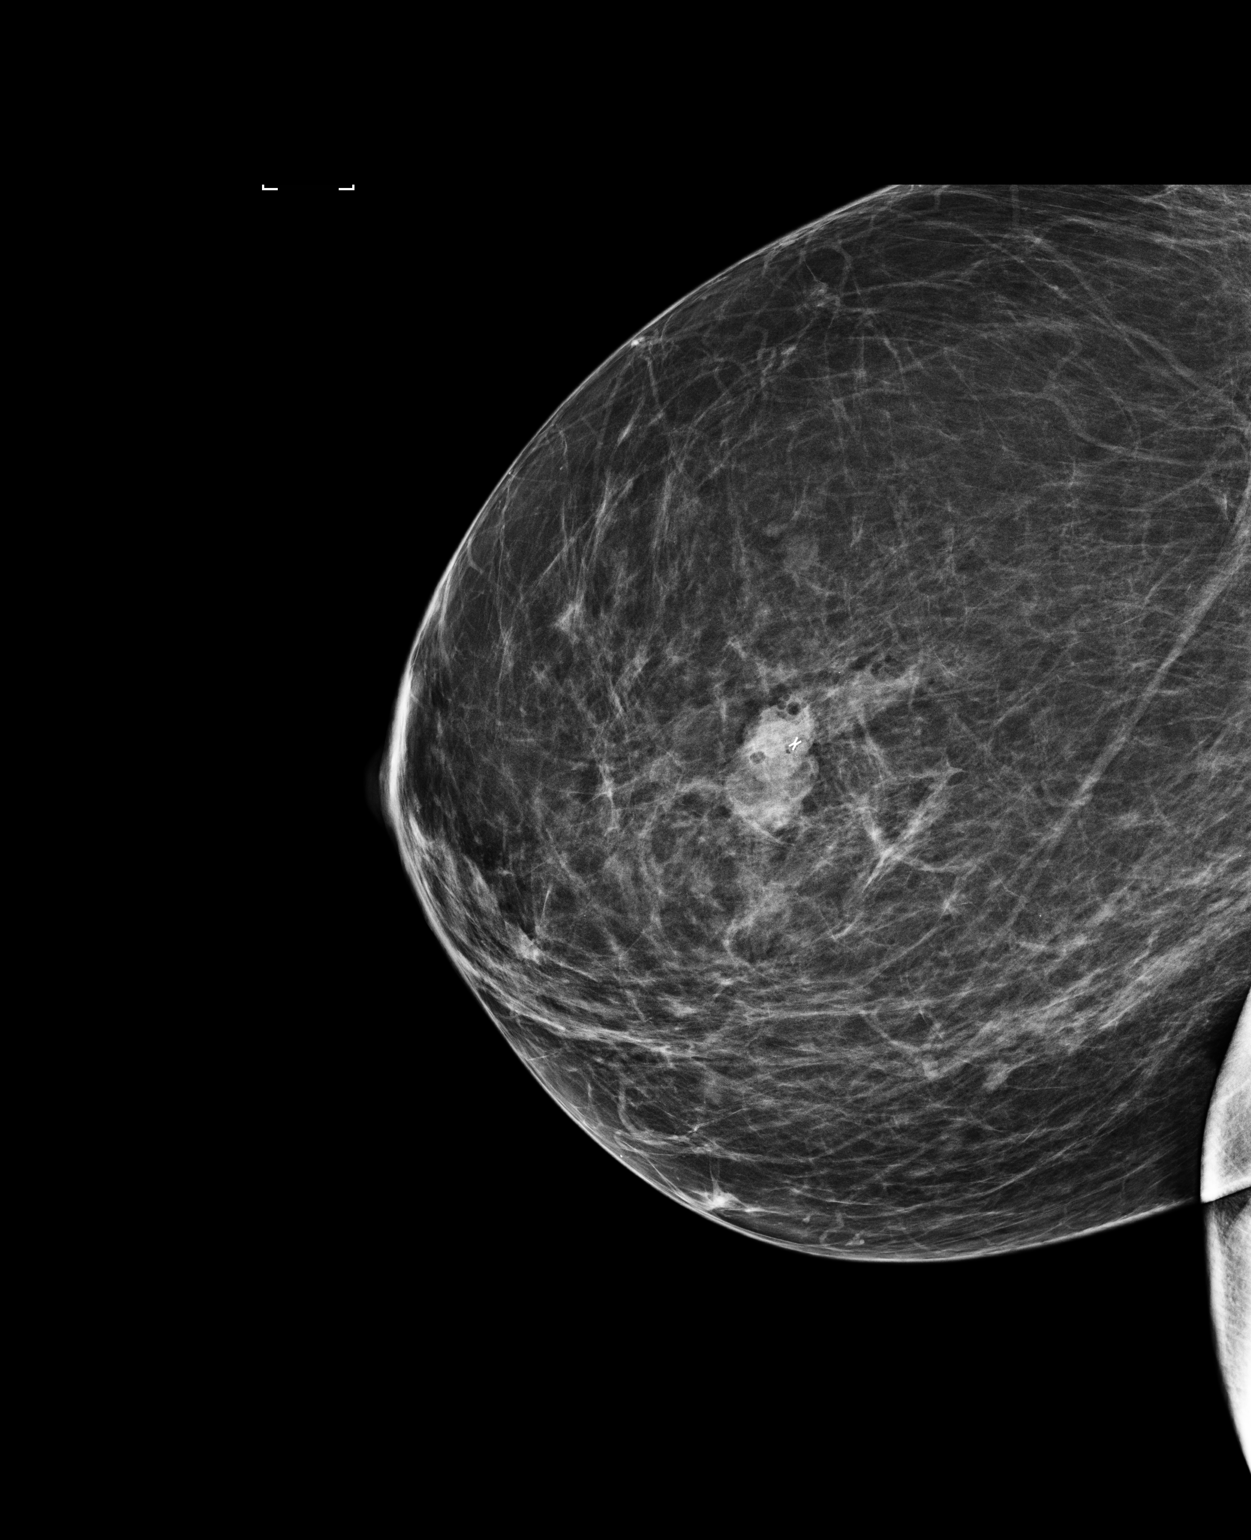

[2 of 2 positions shown; findings below may reference images not displayed]

FINDINGS: Mammographic images were obtained following stereotactic/3D guided
biopsy of right breast upper outer quadrant. X-shaped clip is
appropriately located.
IMPRESSION: Appropriate X-shaped clip location, right breast upper outer
quadrant.

Final Assessment: Post Procedure Mammograms for Marker Placement

## 2016-09-18 ENCOUNTER — Other Ambulatory Visit: Payer: Self-pay | Admitting: Nurse Practitioner

## 2018-12-20 NOTE — Progress Notes (Signed)
REVIEWED-NO ADDITIONAL RECOMMENDATIONS.
